# Patient Record
Sex: Male | Born: 1970
Health system: Southern US, Community
[De-identification: ages and names within clinical notes are randomized; demographics above are authoritative.]

## PROBLEM LIST (undated history)

## (undated) DIAGNOSIS — F32A Depression, unspecified: Secondary | ICD-10-CM

## (undated) DIAGNOSIS — E1110 Type 2 diabetes mellitus with ketoacidosis without coma: Secondary | ICD-10-CM

## (undated) DIAGNOSIS — G709 Myoneural disorder, unspecified: Secondary | ICD-10-CM

## (undated) DIAGNOSIS — E781 Pure hyperglyceridemia: Secondary | ICD-10-CM

## (undated) DIAGNOSIS — E1169 Type 2 diabetes mellitus with other specified complication: Secondary | ICD-10-CM

## (undated) DIAGNOSIS — F419 Anxiety disorder, unspecified: Secondary | ICD-10-CM

## (undated) DIAGNOSIS — E669 Obesity, unspecified: Secondary | ICD-10-CM

## (undated) HISTORY — DX: Obesity, unspecified: E66.9

## (undated) HISTORY — PX: REPLACEMENT TOTAL KNEE: SUR1224

## (undated) HISTORY — DX: Type 2 diabetes mellitus with ketoacidosis without coma: E11.10

## (undated) HISTORY — DX: Myoneural disorder, unspecified: G70.9

## (undated) HISTORY — DX: Pure hyperglyceridemia: E78.1

## (undated) HISTORY — DX: Type 2 diabetes mellitus with other specified complication: E11.69

## (undated) HISTORY — PX: JOINT REPLACEMENT: SHX530

## (undated) HISTORY — PX: OTHER SURGICAL HISTORY: SHX169

---

## 1998-08-01 ENCOUNTER — Emergency Department (HOSPITAL_COMMUNITY): Admission: EM | Admit: 1998-08-01 | Discharge: 1998-08-01 | Payer: Self-pay | Admitting: Emergency Medicine

## 1998-08-02 ENCOUNTER — Emergency Department (HOSPITAL_COMMUNITY): Admission: EM | Admit: 1998-08-02 | Discharge: 1998-08-02 | Payer: Self-pay | Admitting: Emergency Medicine

## 1999-06-05 ENCOUNTER — Emergency Department (HOSPITAL_COMMUNITY): Admission: EM | Admit: 1999-06-05 | Discharge: 1999-06-05 | Payer: Self-pay | Admitting: Emergency Medicine

## 2004-09-21 ENCOUNTER — Emergency Department: Payer: Self-pay | Admitting: Unknown Physician Specialty

## 2006-03-28 ENCOUNTER — Ambulatory Visit: Payer: Self-pay | Admitting: Internal Medicine

## 2006-04-26 ENCOUNTER — Ambulatory Visit: Payer: Self-pay | Admitting: Internal Medicine

## 2006-06-15 ENCOUNTER — Ambulatory Visit: Payer: Self-pay | Admitting: Internal Medicine

## 2006-06-16 ENCOUNTER — Ambulatory Visit: Payer: Self-pay | Admitting: Internal Medicine

## 2006-09-14 ENCOUNTER — Ambulatory Visit: Payer: Self-pay | Admitting: Internal Medicine

## 2007-01-11 ENCOUNTER — Ambulatory Visit: Payer: Self-pay | Admitting: Internal Medicine

## 2010-06-08 ENCOUNTER — Inpatient Hospital Stay (HOSPITAL_COMMUNITY): Admission: RE | Admit: 2010-06-08 | Discharge: 2010-06-10 | Payer: Self-pay | Admitting: Orthopedic Surgery

## 2010-09-21 LAB — BASIC METABOLIC PANEL
BUN: 9 mg/dL (ref 6–23)
BUN: 11 mg/dL (ref 6–23)
BUN: 11 mg/dL (ref 6–23)
CO2: 31 mEq/L (ref 19–32)
CO2: 25 mEq/L (ref 19–32)
CO2: 28 mEq/L (ref 19–32)
Calcium: 8.3 mg/dL — ABNORMAL LOW (ref 8.4–10.5)
Calcium: 8.5 mg/dL (ref 8.4–10.5)
Calcium: 9.3 mg/dL (ref 8.4–10.5)
Chloride: 104 mEq/L (ref 96–112)
Chloride: 103 mEq/L (ref 96–112)
Chloride: 105 mEq/L (ref 96–112)
Creatinine, Ser: 0.98 mg/dL (ref 0.4–1.5)
Creatinine, Ser: 0.95 mg/dL (ref 0.4–1.5)
Creatinine, Ser: 0.95 mg/dL (ref 0.4–1.5)
GFR calc Af Amer: >60 mL/min (ref >60)
GFR calc Af Amer: >60 mL/min (ref >60)
GFR calc Af Amer: >60 mL/min (ref >60)
GFR calc non Af Amer: >60 mL/min (ref >60)
GFR calc non Af Amer: >60 mL/min (ref >60)
GFR calc non Af Amer: >60 mL/min (ref >60)
Glucose, Bld: 119 mg/dL — ABNORMAL HIGH (ref 70–99)
Glucose, Bld: 126 mg/dL — ABNORMAL HIGH (ref 70–99)
Glucose, Bld: 83 mg/dL (ref 70–99)
Potassium: 4.4 mEq/L (ref 3.5–5.1)
Potassium: 4 mEq/L (ref 3.5–5.1)
Potassium: 4.8 mEq/L (ref 3.5–5.1)
Sodium: 140 mEq/L (ref 135–145)
Sodium: 138 mEq/L (ref 135–145)
Sodium: 140 mEq/L (ref 135–145)

## 2010-09-21 LAB — RAPID URINE DRUG SCREEN, HOSP PERFORMED
Amphetamines: NOT DETECTED
Barbiturates: NOT DETECTED
Benzodiazepines: NOT DETECTED
Cocaine: NOT DETECTED
Opiates: NOT DETECTED
Tetrahydrocannabinol: POSITIVE — AB

## 2010-09-21 LAB — CBC
HCT: 33.7 % — ABNORMAL LOW (ref 39.0–52.0)
HCT: 38.7 % — ABNORMAL LOW (ref 39.0–52.0)
HCT: 48.4 % (ref 39.0–52.0)
Hemoglobin: 11.6 g/dL — ABNORMAL LOW (ref 13.0–17.0)
Hemoglobin: 13.4 g/dL (ref 13.0–17.0)
Hemoglobin: 17 g/dL (ref 13.0–17.0)
MCH: 31.2 pg (ref 26.0–34.0)
MCH: 30.9 pg (ref 26.0–34.0)
MCH: 32.5 pg (ref 26.0–34.0)
MCHC: 34.4 g/dL (ref 30.0–36.0)
MCHC: 34.6 g/dL (ref 30.0–36.0)
MCHC: 35.1 g/dL (ref 30.0–36.0)
MCV: 90.6 fL (ref 78.0–100.0)
MCV: 89.2 fL (ref 78.0–100.0)
MCV: 92.6 fL (ref 78.0–100.0)
Platelets: 129 10*3/uL — ABNORMAL LOW (ref 150–400)
Platelets: 143 10*3/uL — ABNORMAL LOW (ref 150–400)
Platelets: 175 10*3/uL (ref 150–400)
RBC: 3.72 MIL/uL — ABNORMAL LOW (ref 4.22–5.81)
RBC: 4.34 MIL/uL (ref 4.22–5.81)
RBC: 5.23 MIL/uL (ref 4.22–5.81)
RDW: 12 % (ref 11.5–15.5)
RDW: 12 % (ref 11.5–15.5)
RDW: 12.3 % (ref 11.5–15.5)
WBC: 13.6 10*3/uL — ABNORMAL HIGH (ref 4.0–10.5)
WBC: 10.9 10*3/uL — ABNORMAL HIGH (ref 4.0–10.5)
WBC: 24 10*3/uL — ABNORMAL HIGH (ref 4.0–10.5)

## 2010-09-21 LAB — URINALYSIS, ROUTINE W REFLEX MICROSCOPIC
Bilirubin Urine: NEGATIVE
Glucose, UA: NEGATIVE mg/dL
Hgb urine dipstick: NEGATIVE
Ketones, ur: NEGATIVE mg/dL
Nitrite: NEGATIVE
Protein, ur: NEGATIVE mg/dL
Specific Gravity, Urine: 1.018 (ref 1.005–1.030)
Urobilinogen, UA: 0.2 mg/dL (ref 0.0–1.0)
pH: 6 (ref 5.0–8.0)

## 2010-09-21 LAB — SURGICAL PCR SCREEN
MRSA, PCR: NEGATIVE
Staphylococcus aureus: NEGATIVE

## 2010-09-21 LAB — DIFFERENTIAL
Basophils Absolute: 0.1 10*3/uL (ref 0.0–0.1)
Basophils Relative: 1 % (ref 0–1)
Eosinophils Absolute: 0.4 10*3/uL (ref 0.0–0.7)
Eosinophils Relative: 3 % (ref 0–5)
Lymphocytes Relative: 31 % (ref 12–46)
Lymphs Abs: 3.4 10*3/uL (ref 0.7–4.0)
Monocytes Absolute: 0.9 10*3/uL (ref 0.1–1.0)
Monocytes Relative: 8 % (ref 3–12)
Neutro Abs: 6.2 10*3/uL (ref 1.7–7.7)
Neutrophils Relative %: 57 % (ref 43–77)

## 2010-09-21 LAB — PROTIME-INR
INR: 0.95 (ref 0.00–1.49)
Prothrombin Time: 12.9 seconds (ref 11.6–15.2)

## 2010-09-21 LAB — TYPE AND SCREEN
ABO/RH(D): O POS
Antibody Screen: NEGATIVE

## 2010-09-21 LAB — APTT: aPTT: 33 seconds (ref 24–37)

## 2010-09-21 LAB — ABO/RH: ABO/RH(D): O POS

## 2010-11-23 NOTE — Assessment & Plan Note (Signed)
[facility]                             PULMONARY OFFICE NOTE   YOSEF, KROGH                       MRN:          [id number]  DATE:01/11/2007                            DOB:          05-24-1971    HISTORY OF PRESENT ILLNESS:  This is a 40 year old white male, former  smoker, with evidence of very mild air flow obstruction by previous  PFT's that I thought was probably minimal COPD with a possible asthmatic  component, suggested by exacerbation of symptoms on exposure to smoke  and cologne. I thought he probably had a very mild asthmatic component  and was concerned because he really did not understand the concept of  maintenance versus rescue therapy and continues to fail to understand  this. He has been consistently breaking the rule of 2's for the last  several weeks with Ventolin but did not institute QVAR therapy as I had  recommended and he is short of breath whenever he does anything more  than slow ADL's.   REVIEW OF SYSTEMS:  He denies any exertional chest pain, orthopnea, PND,  leg swelling, purulent sputum, overt sinus reflux.   PHYSICAL EXAMINATION:  GENERAL:  He is a pleasant ambulatory white male  in no acute distress.  VITAL SIGNS:  Stable.  HEENT:  Unremarkable. Pharynx clear.  LUNGS:  Fields reveal trace wheeze bilaterally.  CARDIOVASCULAR:  Regular rhythm without murmur, rub, or gallop.  ABDOMEN:  Soft and benign.  EXTREMITIES:  No calf tenderness, cyanosis, clubbing, or edema.   IMPRESSION:  Mild chronic obstructive pulmonary disease with a minimal  asthmatic component, for which he has been over-using albuterol for  several weeks and did not follow the instructions on instituting  maintenance therapy. Rather than give him too much to think about, I  have recommended that he start Symbicort 1t60/4.5 2 puffs b.i.d.,  acknowledging up front that he is not likely to comply with maintenance  therapy but at least this way,  when he does use his maintenance therapy,  he will get immediate feedback that the medications are working and  minimize the use of albuterol, which was my stated goal on his previous  visit. With Symbicort, even if he fails to remember the rule of 2's,  he will use the drug up to 2 puffs twice a day (a maximum) and if he  still needs Ventolin and continues to break the rule of 2's, then I  definitely would like to see him back. Otherwise, we will see him back  here on a p.r.n. basis.     Charlaine Dalton. Sherene Sires, MD, [facility]  Electronically Signed    MBW/MedQ  DD: 01/11/2007  DT: 01/12/2007  Job #: [id number]   cc:   Louanna Raw

## 2010-11-26 NOTE — Assessment & Plan Note (Signed)
[facility]                               PULMONARY OFFICE NOTE   Cody Curry, Cody Curry                       MRN:          [id number]  DATE:04/26/2006                            DOB:          [birth year]    REFERRING PHYSICIAN:  Louanna Raw   HISTORY:  This is a 40 year old white male with previous history of toxic  shock syndrome with respiratory failure (presumably ARDS), seen at the  request of Dr. Earlene Plater, for evaluation of dyspnea and abnormal chest x-ray.  He reports occasional symptoms of dyspnea with chest tightness but not  reproducible with exertion or nocturnally.  He denies any pleuritic pain,  fevers, chills, sweats, orthopnea, PND, or leg swelling.   PHYSICAL EXAMINATION:  GENERAL:  He is an anxious white man in no acute  distress.  VITAL SIGNS:  Afebrile, normal vital signs.  HEENT:  Unremarkable.  Oropharynx is clear.  LUNGS:  Fields are clear bilaterally on auscultation percussion, although he  did feel like he was hyperventilating during the exam.  HEART:  There is a regular rhythm without murmur, gallop, or rub.  ABDOMEN:  Soft, benign.  EXTREMITIES:  Warm without calf tenderness, cyanosis, clubbing.   Chest x-ray revealed minimal increase in markings and normal to slightly  reduced lung volumes.  Hemoglobin saturation 98% on room air.   IMPRESSION:  Minimal residual pulmonary fibrosis, status post apparent acute  respiratory distress syndrome from toxic shock syndrome related to a wound  infection.   RECOMMENDATIONS:  1. Return for PFTs in 6 weeks.  2. He does have albuterol to use p.r.n. tightness, but I do not believe      that asthma is likely nor do I believe he truly has beta-2 responsive      lung disease.  I wonder about a component of anxiety that will need to      be addressed, however, if this pattern continues.            ______________________________  Cody Dalton Sherene Sires, MD, [facility]    MBW/MedQ  DD:   04/26/2006  DT:  04/28/2006  Job #:  [id number]   cc:   Louanna Raw

## 2010-11-26 NOTE — Assessment & Plan Note (Signed)
[facility]                             PULMONARY OFFICE NOTE   TIRON, SUSKI                       MRN:          [id number]  DATE:06/16/2006                            DOB:          [birth year]    PRIMARY SERVICE EXTENDED PULMONARY/FOLLOWUP OFFICE VISIT:   HISTORY:  This 40 year old white male former smoker who quit in  September 2007 and has a history of ARDS with abnormal chest x-ray that  I thought just was classic for minimal residual fibrotic change 10 years  after ARDS.  However, he returns for PFTs as requested having used  albuterol p.r.n. chest tightness up to once daily with complete relief  and no nocturnal symptoms.  He is here today with requesting a new  albuterol inhaler.   He denies any significant exertional chest pain, orthopnea, PND, leg  swelling, nocturnal wheeze, fevers, chills, sweats,  excess sputum  product, or chest pain of any kind, sinus symptoms or reflux symptoms.   PHYSICAL EXAMINATION:  GENERAL:  He is a robust ambulatory white male in  acute distress.  VITAL SIGNS:  Stable.  HEENT:  Unremarkable.  Oropharynx clear.  LUNGS:  Lung fields are completely clear bilaterally to auscultation and  percussion.  HEART:  Regular rhythm without murmur, gallop or rub.  ABDOMEN:  Soft, benign.  EXTREMITIES:  Warm without calf tenderness, cyanosis, clubbing or edema.   02 saturation 96% room air.   MDI technique was reviewed with the patient and improved from 25%-50%  with coaching.   PFTs reviewed with patient indicate an FEV-1 of 104% albeit with a ratio  of 66% and a 10% improvement after bronchodilators.  His diffusion  capacity was 83%.   IMPRESSION:  1. Chest x-ray changes are consistent with very mild fibrotic changes      from a previous acute lung injury and do not represent progressive      pulmonary fibrosis.  2. His main symptoms appear to be related to airways instability.      Although we were not  able to document definite asthma today, note      that with such a high baseline FEV-1 we have not excluded it either      and based on his history I would suspect that he does have an      asthmatic component, especially in the setting of having previously      smoked.   I therefore introduced him to the use of Symbicort 80/4.5 two puffs  b.i.d. and I have asked him to start two puffs b.i.d. to see if it  completely eliminates his need for albuterol and all of his  symptoms and if so, he could taper this to one puff b.i.d. but return in  3 months for followup either way.     Charlaine Dalton. Sherene Sires, MD, [facility]  Electronically Signed    MBW/MedQ  DD: 06/17/2006  DT: 06/17/2006  Job #: [id number]   cc:   Louanna Raw

## 2010-11-26 NOTE — Assessment & Plan Note (Signed)
[facility]                             PULMONARY OFFICE NOTE   Cody, Curry                       MRN:          [id number]  DATE:09/14/2006                            DOB:          02-27-1971    HISTORY:  This is a pulmonary/followup office visit. This is a 40-year-  old white male with a history of ARDS with frequent albuterol use. On  his previous visit, he was totally on Symbicort, but then ran out of  Symbicort 2 weeks ago with no increase in albuterol use nor any  symptoms, either daytime or nighttime of either cough or dyspnea.   PHYSICAL EXAMINATION:  GENERAL: He is a pleasant and ambulatory white  man in no acute distress.  VITAL SIGNS: Stable.  HEENT: Unremarkable, oral pharynx is clear.  LUNGS: Fields are clear to auscultation and bilaterally.  CARDIAC: Regular rate and rhythm without murmur, gallop, or rub.  ABDOMEN: Soft and benign.  EXTREMITIES: Warm without calf tenderness, cyanosis, clubbing, or edema.   MDI technique was reviewed and approached with 50% baseline, 90% after  teaching.   IMPRESSION:  No evidence of active chronic asthma. He is at risk based  on his previous history and therefore I reviewed with him the rule of  twos to emphasize to him that he should be to get by with one inhaler  (for which I gave him Ventolin HFA with a counter) to use up to twice  weekly for symptoms, but if he is needing it more than twice weekly, he  needs to restart a controlling medication. His symptoms should be  controlled with Qvar 40 mg 2 puffs b.i.d. tapered to 1 puff b.i.d. if  doing well. If Qvar does not satisfy the rule of twos, then he needs  to return for Symbicort. Follow up will be in 6 months, earlier if  needed.     Charlaine Dalton. Sherene Sires, MD, [facility]  Electronically Signed    MBW/MedQ  DD: 09/14/2006  DT: 09/15/2006  Job #: [id number]

## 2010-11-26 NOTE — Assessment & Plan Note (Signed)
[facility]                               PULMONARY OFFICE NOTE   PACEN, WATFORD                       MRN:          [id number]  DATE:03/28/2006                            DOB:          1970-10-20    REASON FOR CONSULTATION:  Pulmonary infiltrates.   HISTORY:  This is a 40 year old white male, who was actively smoking until  the abrupt onset of dyspnea two weeks ago associated with fever to 103, a  hacking cough productive of  slightly yellowish sputum and also vomiting  with coughing paroxysms.  In addition he noticed a sore throat with poor  appetite, but states he feels almost completely back to normal now, in  fact about 90% better after a course of Biaxin.  Specifically, he denies  any ongoing fever or significant cough, and has minimal dry cough now.  He  denies any pleuritic pain, myalgias, arthralgias, active sinus or reflux  symptoms, or significant dyspnea except with heavy activity.   PAST MEDICAL HISTORY:  Significant for the fact this patient had previous  toxic shock syndrome after a left knee surgery, was in the ICU in [facility] in the mid 90s, apparently on a ventilator.  He does not remember  having chest x-rays done after this event.  He also has a history of mild  sinus and allergy problems, but they are not presently active.   ALLERGIES:  None known.   MEDICATIONS:  None regularly.  He has used p.r.n. albuterol from his wife in  the past; however, does not use it presently.   SOCIAL HISTORY:  He quit smoking March 15, 2006.  He has no unusual  travel, pet or hobby exposure history.   FAMILY HISTORY:  Positive for asthma in a brother.  Negative for respiratory  diseases otherwise.   REVIEW OF SYSTEMS:  Taken in detail on the work sheet, and significant for  the problems as outlined above.   PHYSICAL EXAMINATION:  This is a pleasant, ambulatory white male in no acute  distress.  Afebrile, stable vital  signs.  HEENT:  Unremarkable.  Oropharynx is clear, dentition is intact.  NECK:  Supple without cervical adenopathy or tenderness.  The trachea was  midline, no thyromegaly.  LUNGS:  Lung fields are perfectly clear bilaterally to auscultation and  percussion.  There is a regular rhythm without murmur, gallop or rub.  ABDOMEN:  Soft, benign.  EXTREMITIES:  Warm without calf tenderness, cyanosis, clubbing or edema.   Hemoglobin saturation 97% on room air.   Chest x-rays were reviewed, but I could not read the dates on the disk.  These were apparently done 2 weeks ago, and show minimal increase in  markings.   IMPRESSION:  Abnormal chest x-ray with increased markings, may in fact be a  residual from previous toxic shock syndrome/acute lung injury that occurred  in the mid 90s.  Symptomatically this patient appears to be bothered by  minimal rhinitis, tracheobronchitis that is probably triggered by smoking,  and now that he has quit smoking we should see not only an  elimination of  all of his symptoms, but less likelihood of recurrent infection.   I did recommend a followup at 4 weeks, just to make sure there was no  residual ongoing inflammatory concern (such as one sees sometimes with  atypical pneumonia in the form of inflammatory conditions like bronchiolitis  obliterans with organizing pneumonia.  However, I believe this patient's  prognosis is excellent, especially if he stopped smoking.   I did recommend that he avoid using his wife's inhaler, and will consider  pulmonary function tests on his next visit if he continues to be at all  symptomatic, as well as consideration for followup CT scan, which at this  point is indicated.                                   Charlaine Dalton. Sherene Sires, MD, FCCP   MBW/MedQ  DD:  03/28/2006  DT:  03/29/2006  Job #:  [id number]   cc:   Louanna Raw

## 2015-12-22 ENCOUNTER — Ambulatory Visit (INDEPENDENT_AMBULATORY_CARE_PROVIDER_SITE_OTHER): Payer: BLUE CROSS/BLUE SHIELD | Admitting: Family Medicine

## 2015-12-22 ENCOUNTER — Encounter: Payer: Self-pay | Admitting: Family Medicine

## 2015-12-22 VITALS — BP 126/90 | HR 80 | Temp 98.5°F | Resp 18 | Wt 270.0 lb

## 2015-12-22 DIAGNOSIS — M542 Cervicalgia: Secondary | ICD-10-CM

## 2015-12-22 DIAGNOSIS — Z7189 Other specified counseling: Secondary | ICD-10-CM | POA: Diagnosis not present

## 2015-12-22 DIAGNOSIS — Z23 Encounter for immunization: Secondary | ICD-10-CM | POA: Diagnosis not present

## 2015-12-22 DIAGNOSIS — R208 Other disturbances of skin sensation: Secondary | ICD-10-CM

## 2015-12-22 DIAGNOSIS — R202 Paresthesia of skin: Secondary | ICD-10-CM | POA: Diagnosis not present

## 2015-12-22 DIAGNOSIS — Z7689 Persons encountering health services in other specified circumstances: Secondary | ICD-10-CM

## 2015-12-22 DIAGNOSIS — E669 Obesity, unspecified: Secondary | ICD-10-CM

## 2015-12-22 DIAGNOSIS — R200 Anesthesia of skin: Secondary | ICD-10-CM

## 2015-12-22 NOTE — Progress Notes (Signed)
Subjective:    Patient ID: Cody Curry, male    DOB: 1971/03/20, 45 y.o.   MRN: [id number]  HPI  Here today to establish care. He reports 2 years of pain in his neck radiating into both arms. He states that his right and left arm will go numb almost on a daily basis. In fact his hands are numb right now. All 5 digits on his hands are numb including the fifth digit. He has a negative Tinel sign. He has a negative Phalen sign. He has a negative Spurling sign. Muscle strength in his arms is 5 over 5 equal and symmetric bilaterally. Symptoms sound neuropathic in nature however given the fact the fifth digit is involved, both arms are involved, and they radiate from the neck to the hand, I doubt carpal tunnel but I am suspicious about cervical radiculopathy. He also smokes 1-1/2 packs of cigarettes a day. His blood pressure today is borderline. He has a significant family history of COPD, and hyperlipidemia and hypertriglyceridemia. No past medical history on file. No current outpatient prescriptions on file prior to visit.   No current facility-administered medications on file prior to visit.    No Known Allergies Social History   Social History  . Marital Status: Married    Spouse Name: N/A  . Number of Children: N/A  . Years of Education: N/A   Occupational History  . Not on file.   Social History Main Topics  . Smoking status: Current Every Day Smoker -- 1.50 packs/day    Types: Cigarettes  . Smokeless tobacco: Not on file  . Alcohol Use: 0.0 oz/week    0 Standard drinks or equivalent per week     Comment: Very occasional  . Drug Use: Yes     Comment: Marijuana use  . Sexual Activity: Not on file   Other Topics Concern  . Not on file   Social History Narrative  . No narrative on file   Family History  Problem Relation Age of Onset  . COPD Mother   . Stroke Mother   . Hyperlipidemia Father   . Hypertension Father   . Cancer Father     bladder  . Heart disease  Father   . COPD Father      Review of Systems  All other systems reviewed and are negative.      Objective:   Physical Exam  Constitutional: He is oriented to person, place, and time. He appears well-developed and well-nourished. No distress.  HENT:  Head: Normocephalic and atraumatic.  Right Ear: External ear normal.  Left Ear: External ear normal.  Nose: Nose normal.  Mouth/Throat: Oropharynx is clear and moist. No oropharyngeal exudate.  Eyes: Conjunctivae and EOM are normal. Pupils are equal, round, and reactive to light. Right eye exhibits no discharge. Left eye exhibits no discharge. No scleral icterus.  Neck: Normal range of motion. Neck supple. No JVD present. No tracheal deviation present. No thyromegaly present.  Cardiovascular: Normal rate, regular rhythm, normal heart sounds and intact distal pulses.  Exam reveals no gallop and no friction rub.   No murmur heard. Pulmonary/Chest: Effort normal and breath sounds normal. No stridor. No respiratory distress. He has no wheezes. He has no rales. He exhibits no tenderness.  Abdominal: Soft. Bowel sounds are normal. He exhibits no distension and no mass. There is no tenderness. There is no rebound and no guarding.  Musculoskeletal: He exhibits no edema.       Cervical back: He  exhibits decreased range of motion and pain.       Right hand: He exhibits normal range of motion, no tenderness and no bony tenderness. Decreased sensation noted. Decreased sensation is present in the ulnar distribution and is present in the medial distribution.       Left hand: He exhibits normal range of motion, no tenderness and no bony tenderness. Decreased sensation noted. Decreased sensation is present in the ulnar distribution and is present in the medial distribution.  Lymphadenopathy:    He has no cervical adenopathy.  Neurological: He is alert and oriented to person, place, and time. He has normal reflexes. He displays normal reflexes. No cranial  nerve deficit. He exhibits normal muscle tone. Coordination normal.  Skin: Skin is warm. No rash noted. He is not diaphoretic. No erythema. No pallor.  Psychiatric: He has a normal mood and affect. His behavior is normal. Judgment and thought content normal.  Vitals reviewed.         Assessment & Plan:  Need for Tdap vaccination - Plan: Tdap vaccine greater than or equal to 7yo IM  Establishing care with new doctor, encounter for - Plan: CBC with Differential/Platelet, COMPLETE METABOLIC PANEL WITH GFR, Lipid panel  Neck pain - Plan: MR Cervical Spine Wo Contrast  Right arm numbness - Plan: MR Cervical Spine Wo Contrast  Left arm numbness - Plan: MR Cervical Spine Wo Contrast  Obesity - Plan: CBC with Differential/Platelet, COMPLETE METABOLIC PANEL WITH GFR, Lipid panel  I suspect the patient has cervical radiculopathy causing the numbness in both hands as I discussed in the history of present illness. Carpal tunnel syndrome would be less likely given the fact the fifth digit is involved. Therefore I will schedule the patient for an MRI of the cervical spine as the symptoms are worsening. They've been steadily progressing for last 2 years. If the MRI is normal, I would recommend nerve conduction test. I strongly recommended smoking cessation. His blood pressure today is borderline. I recommended diet exercise and weight loss to help address his blood pressure. I recommended 1500 cal a day and 30 minutes aerobic exercise a day. I would like the patient to return fasting for CBC, CMP, and a fasting lipid panel.

## 2015-12-24 ENCOUNTER — Other Ambulatory Visit: Payer: BLUE CROSS/BLUE SHIELD

## 2015-12-24 ENCOUNTER — Ambulatory Visit: Payer: BLUE CROSS/BLUE SHIELD | Admitting: Family Medicine

## 2015-12-24 VITALS — BP 122/88

## 2015-12-24 DIAGNOSIS — R030 Elevated blood-pressure reading, without diagnosis of hypertension: Principal | ICD-10-CM

## 2015-12-24 DIAGNOSIS — IMO0001 Reserved for inherently not codable concepts without codable children: Secondary | ICD-10-CM

## 2015-12-24 DIAGNOSIS — E669 Obesity, unspecified: Secondary | ICD-10-CM | POA: Diagnosis not present

## 2015-12-24 DIAGNOSIS — Z7189 Other specified counseling: Secondary | ICD-10-CM | POA: Diagnosis not present

## 2015-12-24 LAB — CBC WITH DIFFERENTIAL/PLATELET
Basophils Absolute: 0 cells/uL (ref 0–200)
Basophils Relative: 0 %
Eosinophils Absolute: 236 cells/uL (ref 15–500)
Eosinophils Relative: 2 %
HCT: 46.8 % (ref 38.5–50.0)
Hemoglobin: 15.8 g/dL (ref 13.0–17.0)
Lymphocytes Relative: 27 %
Lymphs Abs: 3186 cells/uL (ref 850–3900)
MCH: 31 pg (ref 27.0–33.0)
MCHC: 33.8 g/dL (ref 32.0–36.0)
MCV: 91.9 fL (ref 80.0–100.0)
MPV: 9.8 fL (ref 7.5–12.5)
Monocytes Absolute: 826 cells/uL (ref 200–950)
Monocytes Relative: 7 %
Neutro Abs: 7552 cells/uL (ref 1500–7800)
Neutrophils Relative %: 64 %
Platelets: 180 10*3/uL (ref 140–400)
RBC: 5.09 MIL/uL (ref 4.20–5.80)
RDW: 13.6 % (ref 11.0–15.0)
WBC: 11.8 10*3/uL — ABNORMAL HIGH (ref 3.8–10.8)

## 2015-12-24 NOTE — Patient Instructions (Signed)
Dr. Dennard Schaumann aware and informed pt that it is still borderline and to keep a check on it and let us know in a few weeks how it is doing. Pt verbalized understanding.

## 2015-12-25 ENCOUNTER — Encounter: Payer: Self-pay | Admitting: Family Medicine

## 2015-12-25 LAB — COMPLETE METABOLIC PANEL WITH GFR
ALT: 20 U/L (ref 9–46)
AST: 20 U/L (ref 10–40)
Albumin: 4.2 g/dL (ref 3.6–5.1)
Alkaline Phosphatase: 83 U/L (ref 40–115)
BUN: 12 mg/dL (ref 7–25)
CO2: 26 mmol/L (ref 20–31)
Calcium: 9.1 mg/dL (ref 8.6–10.3)
Chloride: 104 mmol/L (ref 98–110)
Creat: 0.95 mg/dL (ref 0.60–1.35)
GFR, Est African American: >89 mL/min (ref >=60)
GFR, Est Non African American: >89 mL/min (ref >=60)
Glucose, Bld: 106 mg/dL — ABNORMAL HIGH (ref 70–99)
Potassium: 4.4 mmol/L (ref 3.5–5.3)
Sodium: 138 mmol/L (ref 135–146)
Total Bilirubin: 0.4 mg/dL (ref 0.2–1.2)
Total Protein: 6.9 g/dL (ref 6.1–8.1)

## 2015-12-25 LAB — LIPID PANEL
Cholesterol: 219 mg/dL — ABNORMAL HIGH (ref 125–200)
HDL: 27 mg/dL — ABNORMAL LOW (ref >=40)
Total CHOL/HDL Ratio: 8.1 Ratio — ABNORMAL HIGH (ref <=5.0)
Triglycerides: 639 mg/dL — ABNORMAL HIGH (ref <150)

## 2016-01-01 ENCOUNTER — Telehealth: Payer: Self-pay | Admitting: Family Medicine

## 2016-01-01 DIAGNOSIS — E781 Pure hyperglyceridemia: Secondary | ICD-10-CM

## 2016-01-01 MED ORDER — FENOFIBRATE 160 MG PO TABS: 160.0000 mg | ORAL_TABLET | Freq: Every day | ORAL | Status: DC

## 2016-01-01 NOTE — Telephone Encounter (Signed)
-----   Message from Susy Frizzle, MD sent at 12/25/2015  7:06 AM EDT ----- Cody Curry are almost 700!Marland Kitchen  I would start fenofibrate 160 mg poqday and start low carb, low fat diet and begin exercising 30 min a day 5 days a week and recheck in 3 months.

## 2016-01-01 NOTE — Telephone Encounter (Signed)
Pt aware.  Given dietary and exercise recommendations.  Rx to pharmacy.  Need to see again in 3 months.

## 2016-01-11 ENCOUNTER — Ambulatory Visit
Admission: RE | Admit: 2016-01-11 | Discharge: 2016-01-11 | Disposition: A | Discharge status: Home / Self Care | Payer: BLUE CROSS/BLUE SHIELD | Source: Ambulatory Visit | Attending: Family Medicine | Admitting: Family Medicine

## 2016-01-11 DIAGNOSIS — M542 Cervicalgia: Secondary | ICD-10-CM

## 2016-01-11 DIAGNOSIS — M4802 Spinal stenosis, cervical region: Secondary | ICD-10-CM | POA: Diagnosis not present

## 2016-01-11 DIAGNOSIS — R200 Anesthesia of skin: Secondary | ICD-10-CM

## 2016-01-15 ENCOUNTER — Other Ambulatory Visit: Payer: Self-pay | Admitting: *Deleted

## 2016-01-15 DIAGNOSIS — R200 Anesthesia of skin: Secondary | ICD-10-CM

## 2016-02-18 ENCOUNTER — Encounter: Payer: Self-pay | Admitting: Family Medicine

## 2016-02-18 ENCOUNTER — Ambulatory Visit (INDEPENDENT_AMBULATORY_CARE_PROVIDER_SITE_OTHER): Payer: BLUE CROSS/BLUE SHIELD | Admitting: Family Medicine

## 2016-02-18 VITALS — BP 112/88 | HR 88 | Temp 98.4°F | Resp 18 | Wt 264.0 lb

## 2016-02-18 DIAGNOSIS — M5431 Sciatica, right side: Secondary | ICD-10-CM

## 2016-02-18 MED ORDER — CYCLOBENZAPRINE HCL 10 MG PO TABS: 10.0000 mg | ORAL_TABLET | Freq: Three times a day (TID) | ORAL | 0 refills | Status: DC | PRN

## 2016-02-18 MED ORDER — PREDNISONE 20 MG PO TABS: ORAL_TABLET | ORAL | 0 refills | Status: DC

## 2016-02-18 NOTE — Progress Notes (Signed)
   Subjective:    Patient ID: Cody Curry, male    DOB: 04-04-1971, 45 y.o.   MRN: [id number]  HPI Presents with one-week of severe lower back pain which radiates into his right gluteus and down his posterior right thigh. He also states that he has felt burning pain in the back of his right leg on occasion. He also has muscle spasms in his right lower back. He has severe pain with for flexion. It hurts to sit. It hurts to stand. It hurts to drive. It hurts to bend over to put on his shoes. He cannot find any position that provides relief. He denies any saddle anesthesia. He denies any bowel or bladder incontinence. He does not remember any specific injury although it did occur after he was lifting heavy objects building a slide for his grandchild. Past Medical History:  Diagnosis Date  . Hypertriglyceridemia    No past surgical history on file. Current Outpatient Prescriptions on File Prior to Visit  Medication Sig Dispense Refill  . fenofibrate 160 MG tablet Take 1 tablet (160 mg total) by mouth daily. 90 tablet 0   No current facility-administered medications on file prior to visit.    No Known Allergies Social History   Social History  . Marital status: Married    Spouse name: N/A  . Number of children: N/A  . Years of education: N/A   Occupational History  . Not on file.   Social History Main Topics  . Smoking status: Current Every Day Smoker    Packs/day: 1.50    Types: Cigarettes  . Smokeless tobacco: Not on file  . Alcohol use 0.0 oz/week     Comment: Very occasional  . Drug use:      Comment: Marijuana use  . Sexual activity: Not on file   Other Topics Concern  . Not on file   Social History Narrative  . No narrative on file       Review of Systems  All other systems reviewed and are negative.      Objective:   Physical Exam  Cardiovascular: Normal rate, regular rhythm and normal heart sounds.   Pulmonary/Chest: Effort normal and breath sounds  normal. No respiratory distress. He has no wheezes. He has no rales.  Musculoskeletal:       Lumbar back: He exhibits decreased range of motion, tenderness, pain and spasm. He exhibits no bony tenderness.  Neurological: He displays normal reflexes. He exhibits normal muscle tone. Coordination normal.  Vitals reviewed.         Assessment & Plan:  Sciatica of right side - Plan: predniSONE (DELTASONE) 20 MG tablet, cyclobenzaprine (FLEXERIL) 10 MG tablet I suspect the patient has herniated a disc in his lower back. Begin prednisone taper pack. Use Flexeril 10 mg every 8 hours as needed for muscle spasms. Recheck in one week or sooner if worse

## 2016-12-09 ENCOUNTER — Ambulatory Visit (INDEPENDENT_AMBULATORY_CARE_PROVIDER_SITE_OTHER): Payer: 59 | Admitting: Family Medicine

## 2016-12-09 ENCOUNTER — Encounter: Payer: Self-pay | Admitting: Family Medicine

## 2016-12-09 VITALS — BP 136/92 | HR 76 | Temp 98.3°F | Resp 18 | Ht 70.0 in | Wt 281.0 lb

## 2016-12-09 DIAGNOSIS — R202 Paresthesia of skin: Secondary | ICD-10-CM

## 2016-12-09 DIAGNOSIS — R200 Anesthesia of skin: Secondary | ICD-10-CM | POA: Diagnosis not present

## 2016-12-09 DIAGNOSIS — E781 Pure hyperglyceridemia: Secondary | ICD-10-CM | POA: Diagnosis not present

## 2016-12-09 LAB — COMPLETE METABOLIC PANEL WITH GFR
ALT: 31 U/L (ref 9–46)
AST: 24 U/L (ref 10–40)
Albumin: 4.2 g/dL (ref 3.6–5.1)
Alkaline Phosphatase: 95 U/L (ref 40–115)
BUN: 15 mg/dL (ref 7–25)
CO2: 25 mmol/L (ref 20–31)
Calcium: 9.1 mg/dL (ref 8.6–10.3)
Chloride: 102 mmol/L (ref 98–110)
Creat: 1 mg/dL (ref 0.60–1.35)
GFR, Est African American: >89 mL/min (ref >=60)
GFR, Est Non African American: 89 mL/min (ref >=60)
Glucose, Bld: 118 mg/dL — ABNORMAL HIGH (ref 70–99)
Potassium: 4.2 mmol/L (ref 3.5–5.3)
Sodium: 138 mmol/L (ref 135–146)
Total Bilirubin: 0.5 mg/dL (ref 0.2–1.2)
Total Protein: 6.9 g/dL (ref 6.1–8.1)

## 2016-12-09 LAB — LIPID PANEL
Cholesterol: 269 mg/dL — ABNORMAL HIGH (ref <200)
HDL: 25 mg/dL — ABNORMAL LOW (ref >40)
Total CHOL/HDL Ratio: 10.8 Ratio — ABNORMAL HIGH (ref <5.0)
Triglycerides: 948 mg/dL — ABNORMAL HIGH (ref <150)

## 2016-12-09 NOTE — Progress Notes (Signed)
Subjective:    Patient ID: Cody Curry, male    DOB: [birth year], 46 y.o.   MRN: [id number]  HPI Patient has a history of hypertriglyceridemia with triglyceride levels greater than 600 which he takes fenofibrate. He is long overdue to recheck fasting lab work. He is here today however primarily due to numbness in his right and left arm. An MRI was performed of the cervical spine last year. The results are dictated below: Mild acute exacerbation of chronic C4-C5 facet arthropathy on the left. Mild disc and endplate degeneration also at that level, subsequently with up to mild spinal and biforaminal stenosis. 2. Similar multifactorial mild spinal and biforaminal stenosis at C3-C4.  At that time, we recommend nerve conduction studies to evaluate for possible carpal tunnel syndrome. The patient would like to proceed with that now. Particularly, he recently had an incident where after mowing his yard using a push mower, he felt sudden sharp electrical pain radiating from his wrist down the fourth digit. The pain was intense and he was also experiencing numbness. Pain also seems to happen whenever he is driving. The numbness and pain primarily involves his forearms and hands bilaterally right greater than left. Past Medical History:  Diagnosis Date  . Hypertriglyceridemia    No past surgical history on file. Current Outpatient Prescriptions on File Prior to Visit  Medication Sig Dispense Refill  . cyclobenzaprine (FLEXERIL) 10 MG tablet Take 1 tablet (10 mg total) by mouth 3 (three) times daily as needed for muscle spasms. 30 tablet 0  . fenofibrate 160 MG tablet Take 1 tablet (160 mg total) by mouth daily. 90 tablet 0  . predniSONE (DELTASONE) 20 MG tablet 3 tabs poqday 1-2, 2 tabs poqday 3-4, 1 tab poqday 5-6 12 tablet 0   No current facility-administered medications on file prior to visit.    No Known Allergies Social History   Social History  . Marital status: Married    Spouse name:  N/A  . Number of children: N/A  . Years of education: N/A   Occupational History  . Not on file.   Social History Main Topics  . Smoking status: Current Every Day Smoker    Packs/day: 1.50    Types: Cigarettes  . Smokeless tobacco: Never Used  . Alcohol use 0.0 oz/week     Comment: Very occasional  . Drug use: Yes     Comment: Marijuana use  . Sexual activity: Not on file   Other Topics Concern  . Not on file   Social History Narrative  . No narrative on file      Review of Systems  All other systems reviewed and are negative.      Objective:   Physical Exam  Constitutional: He is oriented to person, place, and time.  Cardiovascular: Normal rate, regular rhythm and normal heart sounds.   Pulmonary/Chest: Effort normal and breath sounds normal. No respiratory distress. He has no wheezes.  Neurological: He is oriented to person, place, and time. He has normal reflexes. He displays normal reflexes. No cranial nerve deficit. He exhibits normal muscle tone. Coordination normal.  Vitals reviewed.         Assessment & Plan:  Hypertriglyceridemia - Plan: COMPLETE METABOLIC PANEL WITH GFR, Lipid panel  Right arm numbness  Left arm numbness  I'll schedule patient for EMG/nerve conduction studies with neurology. If the patient doesn't fact have moderate to severe carpal tunnel syndrome is a suspect, I will refer the patient to a hand  surgeon to discuss treatment options. Meanwhile check a fasting lipid panel to monitor the management of his hypertriglyceridemia. Blood pressure slightly elevated today. Recommended diet and weight loss.

## 2016-12-13 ENCOUNTER — Other Ambulatory Visit: Payer: Self-pay | Admitting: Family Medicine

## 2016-12-13 DIAGNOSIS — E781 Pure hyperglyceridemia: Secondary | ICD-10-CM

## 2016-12-13 MED ORDER — FENOFIBRATE 160 MG PO TABS: 160.0000 mg | ORAL_TABLET | Freq: Every day | ORAL | 3 refills | Status: DC

## 2016-12-13 MED ORDER — OMEGA-3-ACID ETHYL ESTERS 1 G PO CAPS: 2.0000 g | ORAL_CAPSULE | Freq: Two times a day (BID) | ORAL | 3 refills | Status: DC

## 2016-12-14 ENCOUNTER — Telehealth: Payer: Self-pay | Admitting: *Deleted

## 2016-12-14 NOTE — Telephone Encounter (Signed)
Received call from patient.   States that he cannot afford Lovaza.   MD please advise.

## 2016-12-15 MED ORDER — FISH OIL 1000 MG PO CPDR: 2000.0000 mg | DELAYED_RELEASE_CAPSULE | Freq: Every day | ORAL | Status: DC

## 2016-12-15 NOTE — Telephone Encounter (Signed)
Just get OTC fish oil 2000 mg poqday

## 2017-01-25 ENCOUNTER — Ambulatory Visit (INDEPENDENT_AMBULATORY_CARE_PROVIDER_SITE_OTHER): Payer: 59 | Admitting: Family Medicine

## 2017-01-25 ENCOUNTER — Telehealth: Payer: Self-pay | Admitting: *Deleted

## 2017-01-25 ENCOUNTER — Encounter: Payer: Self-pay | Admitting: Family Medicine

## 2017-01-25 ENCOUNTER — Other Ambulatory Visit: Payer: 59

## 2017-01-25 ENCOUNTER — Encounter: Payer: Self-pay | Admitting: Neurology

## 2017-01-25 VITALS — BP 132/68 | HR 88 | Temp 98.4°F | Resp 16 | Ht 70.0 in | Wt 287.0 lb

## 2017-01-25 DIAGNOSIS — Z720 Tobacco use: Secondary | ICD-10-CM

## 2017-01-25 DIAGNOSIS — R7309 Other abnormal glucose: Secondary | ICD-10-CM

## 2017-01-25 DIAGNOSIS — J209 Acute bronchitis, unspecified: Secondary | ICD-10-CM | POA: Diagnosis not present

## 2017-01-25 DIAGNOSIS — E785 Hyperlipidemia, unspecified: Secondary | ICD-10-CM

## 2017-01-25 LAB — LIPID PANEL
Cholesterol: 198 mg/dL (ref <200)
HDL: 31 mg/dL — ABNORMAL LOW (ref >40)
LDL Cholesterol: 106 mg/dL — ABNORMAL HIGH (ref <100)
Total CHOL/HDL Ratio: 6.4 Ratio — ABNORMAL HIGH (ref <5.0)
Triglycerides: 306 mg/dL — ABNORMAL HIGH (ref <150)
VLDL: 61 mg/dL — ABNORMAL HIGH (ref <30)

## 2017-01-25 MED ORDER — PREDNISONE 20 MG PO TABS: ORAL_TABLET | ORAL | 0 refills | Status: DC

## 2017-01-25 MED ORDER — AZITHROMYCIN 250 MG PO TABS: ORAL_TABLET | ORAL | 0 refills | Status: DC

## 2017-01-25 NOTE — Progress Notes (Addendum)
   Subjective:    Patient ID: Cody Curry, male    DOB: March 29, 1971, 46 y.o.   MRN: [id number]  Patient presents for Illness (x3 days- productive cough with thick white mucus, sore throat, fatigue, dizziness)   Patient here with cough with production mild wheezing for the past 3 days. He has had some fatigue some lightheaded spells U when he was driving yesterday. He denies any fever or chills. No well sinus drainage. He does smoke. He is also under some stress as he is taking care of his 46-year-old grandson. He was briefly seen for some neuropathy symptoms in his hands which he's had for while he is waiting his nerve conduction study. He also has significant elevated cholesterol states that he when he changed his diet and start exercising he continued to gain weight. He had repeat labs done this morning he states he is taking the TriCor and the Lovaza. I did note that his fasting glucose was elevated at 118.  Review Of Systems:  GEN- + fatigue, denies  fever, weight loss,weakness, recent illness HEENT- denies eye drainage, change in vision, nasal discharge, CVS- denies chest pain, palpitations RESP- denies SOB, +cough, +wheeze ABD- denies N/V, change in stools, abd pain GU- denies dysuria, hematuria, dribbling, incontinence MSK- denies joint pain, muscle aches, injury Neuro- denies headache, +dizziness, syncope, seizure activity       Objective:    BP 132/68   Pulse 88   Temp 98.4 F (36.9 C) (Oral)   Resp 16   Ht 5\' 10"  (1.778 m)   Wt 287 lb (130.2 kg)   SpO2 96%   BMI 41.18 kg/m  GEN- NAD, alert and oriented x3 HEENT- PERRL, EOMI, non injected sclera, pink conjunctiva, MMM, oropharynx clear, TM clear bilat, no effusion Neck- Supple, no LAD  CVS- RRR, no murmur RESP-rhonchi bilat, bilat wheeze, normal WOB, no retractions  EXT- No edema Neuro- CNII-XII in tact, no nystagmus no focal deficits  Pulses- Radial, 2+        Assessment & Plan:      Problem List Items  Addressed This Visit    None    Visit Diagnoses    Acute bronchitis, unspecified organism    -  Primary   Treat with zpak, prednisone, muicinex DM. Also concerned about his elevated blood sugar in setting of the TG, obtain A1C and add CBC to his labs, continue to work on dietary changes As he's had some coughing spells with dizziness and he does drive back and forth to jobs he did have to go home early yesterday I covered this with a note as well as for the next 2 days while he gets his medications in.  I will also have a referral's nurse look into his nerve conduction and make sure we get this scheduled    Relevant Orders   CBC with Differential/Platelet   Elevated glucose       Relevant Orders   Hemoglobin A1c   CBC with Differential/Platelet   Tobacco use       discussed cessation he is cutting back      Note: This dictation was prepared with Dragon dictation along with smaller phrase technology. Any transcriptional errors that result from this process are unintentional.

## 2017-01-25 NOTE — Patient Instructions (Addendum)
Take antibiotics as prescribed/ prednisone Use robitussum DM and Mucinex DM  Nerve conduction to be set up We will call with lab results F/U pending results

## 2017-01-25 NOTE — Telephone Encounter (Signed)
Lmvm letting pt know that his info has been faxed over to [facility] and they will call pt and schedule appt.

## 2017-01-26 ENCOUNTER — Other Ambulatory Visit: Payer: Self-pay | Admitting: *Deleted

## 2017-01-26 ENCOUNTER — Encounter: Payer: Self-pay | Admitting: Family Medicine

## 2017-01-26 DIAGNOSIS — R200 Anesthesia of skin: Secondary | ICD-10-CM

## 2017-01-26 LAB — CBC WITH DIFFERENTIAL/PLATELET
Basophils Absolute: 0 cells/uL (ref 0–200)
Basophils Relative: 0 %
Eosinophils Absolute: 270 cells/uL (ref 15–500)
Eosinophils Relative: 2 %
HCT: 48.2 % (ref 38.5–50.0)
Hemoglobin: 16 g/dL (ref 13.0–17.0)
Lymphocytes Relative: 20 %
Lymphs Abs: 2700 cells/uL (ref 850–3900)
MCH: 31.7 pg (ref 27.0–33.0)
MCHC: 33.2 g/dL (ref 32.0–36.0)
MCV: 95.4 fL (ref 80.0–100.0)
MPV: 10.6 fL (ref 7.5–12.5)
Monocytes Absolute: 1215 cells/uL — ABNORMAL HIGH (ref 200–950)
Monocytes Relative: 9 %
Neutro Abs: 9315 cells/uL — ABNORMAL HIGH (ref 1500–7800)
Neutrophils Relative %: 69 %
Platelets: 199 10*3/uL (ref 140–400)
RBC: 5.05 MIL/uL (ref 4.20–5.80)
RDW: 13.5 % (ref 11.0–15.0)
WBC: 13.5 10*3/uL — ABNORMAL HIGH (ref 3.8–10.8)

## 2017-01-26 LAB — HEMOGLOBIN A1C
Hgb A1c MFr Bld: 6.9 % — ABNORMAL HIGH (ref <5.7)
Mean Plasma Glucose: 151 mg/dL

## 2017-01-26 MED ORDER — METFORMIN HCL 500 MG PO TABS: 500.0000 mg | ORAL_TABLET | Freq: Two times a day (BID) | ORAL | 3 refills | Status: DC

## 2017-01-27 ENCOUNTER — Ambulatory Visit
Admission: RE | Admit: 2017-01-27 | Discharge: 2017-01-27 | Disposition: A | Discharge status: Home / Self Care | Payer: 59 | Source: Ambulatory Visit | Attending: Family Medicine | Admitting: Family Medicine

## 2017-01-27 ENCOUNTER — Encounter: Payer: Self-pay | Admitting: Family Medicine

## 2017-01-27 ENCOUNTER — Ambulatory Visit (INDEPENDENT_AMBULATORY_CARE_PROVIDER_SITE_OTHER): Payer: 59 | Admitting: Family Medicine

## 2017-01-27 VITALS — BP 132/88 | HR 84 | Temp 99.0°F | Resp 18 | Ht 70.0 in | Wt 284.0 lb

## 2017-01-27 DIAGNOSIS — R091 Pleurisy: Secondary | ICD-10-CM

## 2017-01-27 MED ORDER — HYDROCODONE-ACETAMINOPHEN 5-325 MG PO TABS: 1.0000 | ORAL_TABLET | Freq: Four times a day (QID) | ORAL | 0 refills | Status: DC | PRN

## 2017-01-27 NOTE — Progress Notes (Signed)
Subjective:    Patient ID: Cody Curry, male    DOB: 1970-11-14, 46 y.o.   MRN: [id number]  HPI Patient was seen on the 18th and was diagnosed with bronchitis by mouth partner and was started on a Z-Pak along with some prednisone. He continues to cough. He continues to report shortness of breath. Cough is productive of white mucus. However he is now developed severe right-sided pleurisy. Is located in the axillary line around the 10th rib. He is extremely tender to touch in this area. He believes he may have fractured rib or coughing. He denies any hemoptysis. Breath sounds are equal on both sides however he does have noticeable for wheezing in all 4 lung fields. I do not appreciate any crackles Rales. Past Medical History:  Diagnosis Date  . Diabetes mellitus type 2 in obese ([place])   . Hypertriglyceridemia    No past surgical history on file. Current Outpatient Prescriptions on File Prior to Visit  Medication Sig Dispense Refill  . azithromycin (ZITHROMAX) 250 MG tablet Take 2 tablets x 1 day then 1 tablet daily for 4 days 6 tablet 0  . cyclobenzaprine (FLEXERIL) 10 MG tablet Take 1 tablet (10 mg total) by mouth 3 (three) times daily as needed for muscle spasms. 30 tablet 0  . fenofibrate 160 MG tablet Take 1 tablet (160 mg total) by mouth daily. 90 tablet 3  . metFORMIN (GLUCOPHAGE) 500 MG tablet Take 1 tablet (500 mg total) by mouth 2 (two) times daily with a meal. 180 tablet 3  . Omega-3 Fatty Acids (FISH OIL) 1000 MG CPDR Take 2,000 mg by mouth daily.    . predniSONE (DELTASONE) 20 MG tablet Take 2 tablets daily x 5 days 10 tablet 0   No current facility-administered medications on file prior to visit.    No Known Allergies Social History   Social History  . Marital status: Married    Spouse name: N/A  . Number of children: N/A  . Years of education: N/A   Occupational History  . Not on file.   Social History Main Topics  . Smoking status: Current Every Day Smoker   Packs/day: 1.50    Types: Cigarettes  . Smokeless tobacco: Never Used  . Alcohol use 0.0 oz/week     Comment: Very occasional  . Drug use: Yes     Comment: Marijuana use  . Sexual activity: Not on file   Other Topics Concern  . Not on file   Social History Narrative  . No narrative on file      Review of Systems  All other systems reviewed and are negative.      Objective:   Physical Exam  Constitutional: He appears well-developed and well-nourished. No distress.  HENT:  Right Ear: External ear normal.  Left Ear: External ear normal.  Nose: Nose normal.  Mouth/Throat: Oropharynx is clear and moist. No oropharyngeal exudate.  Eyes: Conjunctivae are normal.  Neck: Neck supple.  Cardiovascular: Normal rate, regular rhythm and normal heart sounds.   Pulmonary/Chest: No respiratory distress. He has wheezes. He has no rales. He exhibits tenderness.  Abdominal: Soft. Bowel sounds are normal.  Lymphadenopathy:    He has no cervical adenopathy.  Skin: He is not diaphoretic.  Vitals reviewed.         Assessment & Plan:  Pleurisy - Plan: DG Chest 2 View  Patient certainly seems to have bronchitis with reactive airway disease secondary to smoking. I recommended he use albuterol 2.5  mg nebs every 6 hours to improve the wheezing. Continue Z-Pak and his prednisone. I will give him Norco 5/325 one by mouth every 6 hours when necessary pain. I encouraged aggressive pulmonary hygiene. He must be diligent to avoid secondary pneumonia.  Obtain chest x-ray to rule out underlying pneumonia. Recommend smoking cessation

## 2017-01-31 ENCOUNTER — Encounter: Payer: Self-pay | Admitting: Family Medicine

## 2017-01-31 ENCOUNTER — Telehealth: Payer: Self-pay | Admitting: Family Medicine

## 2017-01-31 NOTE — Telephone Encounter (Signed)
Pt called saying he is still hurting when he is coughing so he is still taking the pain medication that we prescribed, but he has not been able to go back to work because he travels and he cannot drive with the medication, wants to know if we can give him a work note.

## 2017-01-31 NOTE — Telephone Encounter (Signed)
Pt aware and note printed, signed and left up front and pt will p/u Thursday.

## 2017-01-31 NOTE — Telephone Encounter (Signed)
Ok through Wednesday.  Return Thursday.

## 2017-02-23 ENCOUNTER — Ambulatory Visit (INDEPENDENT_AMBULATORY_CARE_PROVIDER_SITE_OTHER): Payer: 59 | Admitting: Neurology

## 2017-02-23 DIAGNOSIS — R200 Anesthesia of skin: Secondary | ICD-10-CM

## 2017-02-23 DIAGNOSIS — G5603 Carpal tunnel syndrome, bilateral upper limbs: Secondary | ICD-10-CM

## 2017-02-23 NOTE — Procedures (Addendum)
[facility]  [place]  [place], [place] [address] Tel: [phone] Fax:  [phone] Test Date:  02/23/2017  Patient: Cody Curry DOB: [birth year] Physician: Narda Amber, DO  Sex: Male Height: 5\' 10"  Ref Phys: Jenna Luo, M.D.  ID#: [id number] Temp: 35.6C Technician:    Patient Complaints: This is a 46 year-old gentleman referred for evaluation of bilateral hand and forearm paresthesias, worse on the right.  NCV & EMG Findings: Extensive electrodiagnostic testing of the right upper extremity and additional studies of the left shows:  1. Right median sensory response is absent. Left median sensory response shows prolonged peak latency (5.6 ms) and reduced amplitude (7.3 V).  Bilateral ulnar sensory responses are within normal limits. 2. 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 The left median motor response shows prolonged latency (6.1 ms) and low normal amplitude. Bilateral ulnar motor responses are within normal limits. 3. There is no evidence of acute or chronic motor axon loss changes affecting any of the tested muscles. Motor unit configuration and recruitment pattern is within normal limits.  Impression: 1. Bilateral median neuropathy at or distal to the wrist, consistent with the clinical diagnosis of carpal tunnel syndrome. Overall, these findings are severe in degree electrically and significantly worse on the right. 2. There is no evidence of a cervical radiculopathy affecting the upper extremities.  3. Incidentally, there is a right Martin-Gruber anastomosis, a normal variant.    ___________________________ Narda Amber, DO    Nerve Conduction Studies Anti Sensory Summary Table   Site NR Peak (ms) Norm Peak (ms) P-T Amp (V) Norm P-T Amp  Left Median Anti Sensory  (2nd Digit)  35.6C  Wrist    5.6 <3.4 7.3 >20  Right Median Anti Sensory (2nd Digit)  35.6C  Wrist NR  <3.4  >20  Left Ulnar Anti Sensory (5th Digit)  35.6C  Wrist    2.8 <3.1 14.4 >12  Right Ulnar Anti Sensory (5th Digit)  35.6C  Wrist    2.8 <3.1 13.6 >12   Motor Summary Table   Site NR Onset (ms) Norm Onset (ms) O-P Amp (mV) Norm O-P Amp Site1 Site2 Delta-0 (ms) Dist (cm) Vel (m/s) Norm Vel (m/s)  Left Median Motor (Abd Poll Brev)  35.6C  Wrist    6.1 <3.9 6.1 >6 Elbow Wrist 4.8 28.0 58 >50  Elbow    10.9  5.6         Right Median Motor (Abd Poll Brev)  35.6C  Wrist    8.8 <3.9 5.3 >6 Elbow Wrist 4.1 28.0 68 >50  Elbow    12.9  4.4  Ulnar-wrist crossover Elbow 8.6 0.0    Ulnar-wrist crossover    4.3  2.9         Left Ulnar Motor (Abd Dig Minimi)  35.6C  Wrist    2.3 <3.1 13.0 >7 B Elbow Wrist 4.1 23.0 56 >50  B Elbow    6.4  12.0  A Elbow B Elbow 1.8 10.0 56 >50  A Elbow    8.2  11.4         Right Ulnar Motor (Abd Dig Minimi)  35.6C  Wrist    2.1 <3.1 15.2 >7 B Elbow Wrist 4.5 25.0 56 >50  B Elbow    6.6  14.1  A Elbow B Elbow 1.6 10.0 63 >50  A Elbow    8.2  14.0          EMG   Side Muscle Ins Act Fibs Psw Fasc Number Recrt Dur Dur. Amp Amp. Poly Poly. Comment  Right 1stDorInt Nml Nml Nml Nml Nml Nml Nml Nml Nml Nml Nml Nml N/A  Right Abd Poll Brev Nml Nml Nml Nml Nml Nml Nml Nml Nml Nml Nml Nml N/A  Right PronatorTeres Nml Nml Nml Nml Nml Nml Nml Nml Nml Nml Nml Nml N/A  Right Biceps Nml Nml Nml Nml Nml Nml Nml Nml Nml Nml Nml Nml N/A  Right Triceps Nml Nml Nml Nml Nml Nml Nml Nml Nml Nml Nml Nml N/A  Right Deltoid Nml Nml Nml Nml Nml Nml Nml Nml Nml Nml Nml Nml N/A  Left 1stDorInt Nml Nml Nml Nml Nml Nml Nml Nml Nml Nml Nml Nml N/A  Left Abd Poll Brev Nml Nml Nml Nml Nml Nml Nml Nml Nml Nml Nml Nml N/A  Left PronatorTeres Nml Nml Nml Nml Nml Nml Nml Nml Nml Nml Nml Nml N/A  Left Biceps Nml Nml Nml Nml Nml Nml Nml Nml Nml Nml Nml Nml N/A  Left Triceps Nml Nml Nml  Nml Nml Nml Nml Nml Nml Nml Nml Nml N/A  Left Deltoid Nml Nml Nml Nml Nml Nml Nml Nml Nml Nml Nml Nml N/A      Waveforms:

## 2017-02-28 ENCOUNTER — Other Ambulatory Visit: Payer: Self-pay | Admitting: Family Medicine

## 2017-02-28 DIAGNOSIS — G5600 Carpal tunnel syndrome, unspecified upper limb: Secondary | ICD-10-CM

## 2017-03-20 DIAGNOSIS — M79641 Pain in right hand: Secondary | ICD-10-CM | POA: Insufficient documentation

## 2017-03-20 DIAGNOSIS — G5603 Carpal tunnel syndrome, bilateral upper limbs: Secondary | ICD-10-CM | POA: Insufficient documentation

## 2017-03-21 ENCOUNTER — Other Ambulatory Visit: Payer: Self-pay | Admitting: Orthopedic Surgery

## 2017-03-27 ENCOUNTER — Encounter (HOSPITAL_COMMUNITY): Payer: Self-pay | Admitting: Emergency Medicine

## 2017-03-27 DIAGNOSIS — E860 Dehydration: Secondary | ICD-10-CM | POA: Diagnosis present

## 2017-03-27 DIAGNOSIS — Z6837 Body mass index (BMI) 37.0-37.9, adult: Secondary | ICD-10-CM | POA: Diagnosis not present

## 2017-03-27 DIAGNOSIS — E1110 Type 2 diabetes mellitus with ketoacidosis without coma: Principal | ICD-10-CM | POA: Diagnosis present

## 2017-03-27 DIAGNOSIS — E781 Pure hyperglyceridemia: Secondary | ICD-10-CM | POA: Diagnosis not present

## 2017-03-27 DIAGNOSIS — Z79899 Other long term (current) drug therapy: Secondary | ICD-10-CM | POA: Diagnosis not present

## 2017-03-27 DIAGNOSIS — E669 Obesity, unspecified: Secondary | ICD-10-CM | POA: Diagnosis present

## 2017-03-27 DIAGNOSIS — R6510 Systemic inflammatory response syndrome (SIRS) of non-infectious origin without acute organ dysfunction: Secondary | ICD-10-CM | POA: Diagnosis present

## 2017-03-27 DIAGNOSIS — Z87891 Personal history of nicotine dependence: Secondary | ICD-10-CM | POA: Diagnosis not present

## 2017-03-27 DIAGNOSIS — R5383 Other fatigue: Secondary | ICD-10-CM | POA: Diagnosis present

## 2017-03-27 DIAGNOSIS — Z7984 Long term (current) use of oral hypoglycemic drugs: Secondary | ICD-10-CM

## 2017-03-27 NOTE — ED Triage Notes (Signed)
Pt c/o emesis, diarrhea, fatigue, blurred vision, and muscle cramps since starting metformin. Pt states it has been going on x 2 weeks.

## 2017-03-28 ENCOUNTER — Emergency Department (HOSPITAL_COMMUNITY): Payer: 59

## 2017-03-28 ENCOUNTER — Inpatient Hospital Stay (HOSPITAL_COMMUNITY): Payer: 59

## 2017-03-28 ENCOUNTER — Observation Stay (HOSPITAL_COMMUNITY)
Admission: EM | Admit: 2017-03-28 | Discharge: 2017-03-29 | DRG: 638 | Disposition: A | Discharge status: Home / Self Care | Payer: 59 | Attending: Internal Medicine | Admitting: Internal Medicine

## 2017-03-28 ENCOUNTER — Encounter (HOSPITAL_COMMUNITY): Payer: Self-pay

## 2017-03-28 DIAGNOSIS — N179 Acute kidney failure, unspecified: Secondary | ICD-10-CM | POA: Diagnosis not present

## 2017-03-28 DIAGNOSIS — E1165 Type 2 diabetes mellitus with hyperglycemia: Secondary | ICD-10-CM

## 2017-03-28 DIAGNOSIS — E1110 Type 2 diabetes mellitus with ketoacidosis without coma: Secondary | ICD-10-CM

## 2017-03-28 DIAGNOSIS — R739 Hyperglycemia, unspecified: Secondary | ICD-10-CM | POA: Diagnosis present

## 2017-03-28 DIAGNOSIS — R197 Diarrhea, unspecified: Secondary | ICD-10-CM | POA: Diagnosis present

## 2017-03-28 DIAGNOSIS — E118 Type 2 diabetes mellitus with unspecified complications: Secondary | ICD-10-CM

## 2017-03-28 HISTORY — DX: Type 2 diabetes mellitus with ketoacidosis without coma: E11.10

## 2017-03-28 LAB — COMPREHENSIVE METABOLIC PANEL
ALT: 59 U/L (ref 17–63)
AST: 32 U/L (ref 15–41)
Albumin: 4.4 g/dL (ref 3.5–5.0)
Alkaline Phosphatase: 168 U/L — ABNORMAL HIGH (ref 38–126)
Anion gap: 20 — ABNORMAL HIGH (ref 5–15)
BUN: 31 mg/dL — ABNORMAL HIGH (ref 6–20)
CO2: 19 mmol/L — ABNORMAL LOW (ref 22–32)
Calcium: 10 mg/dL (ref 8.9–10.3)
Chloride: 75 mmol/L — ABNORMAL LOW (ref 101–111)
Creatinine, Ser: 1.24 mg/dL (ref 0.61–1.24)
GFR calc Af Amer: >60 mL/min (ref >60)
GFR calc non Af Amer: >60 mL/min (ref >60)
Glucose, Bld: 919 mg/dL (ref 65–99)
Potassium: 5 mmol/L (ref 3.5–5.1)
Sodium: 114 mmol/L — CL (ref 135–145)
Total Bilirubin: 0.9 mg/dL (ref 0.3–1.2)
Total Protein: 7.1 g/dL (ref 6.5–8.1)

## 2017-03-28 LAB — BASIC METABOLIC PANEL
Anion gap: 18 — ABNORMAL HIGH (ref 5–15)
Anion gap: 11 (ref 5–15)
Anion gap: 14 (ref 5–15)
Anion gap: 13 (ref 5–15)
BUN: 29 mg/dL — ABNORMAL HIGH (ref 6–20)
BUN: 23 mg/dL — ABNORMAL HIGH (ref 6–20)
BUN: 23 mg/dL — ABNORMAL HIGH (ref 6–20)
BUN: 20 mg/dL (ref 6–20)
CO2: 18 mmol/L — ABNORMAL LOW (ref 22–32)
CO2: 25 mmol/L (ref 22–32)
CO2: 22 mmol/L (ref 22–32)
CO2: 23 mmol/L (ref 22–32)
Calcium: 9 mg/dL (ref 8.9–10.3)
Calcium: 8.7 mg/dL — ABNORMAL LOW (ref 8.9–10.3)
Calcium: 8.5 mg/dL — ABNORMAL LOW (ref 8.9–10.3)
Calcium: 8.4 mg/dL — ABNORMAL LOW (ref 8.9–10.3)
Chloride: 88 mmol/L — ABNORMAL LOW (ref 101–111)
Chloride: 96 mmol/L — ABNORMAL LOW (ref 101–111)
Chloride: 103 mmol/L (ref 101–111)
Chloride: 102 mmol/L (ref 101–111)
Creatinine, Ser: 1.14 mg/dL (ref 0.61–1.24)
Creatinine, Ser: 0.92 mg/dL (ref 0.61–1.24)
Creatinine, Ser: 1.06 mg/dL (ref 0.61–1.24)
Creatinine, Ser: 0.89 mg/dL (ref 0.61–1.24)
GFR calc Af Amer: >60 mL/min (ref >60)
GFR calc Af Amer: >60 mL/min (ref >60)
GFR calc Af Amer: >60 mL/min (ref >60)
GFR calc Af Amer: >60 mL/min (ref >60)
GFR calc non Af Amer: >60 mL/min (ref >60)
GFR calc non Af Amer: >60 mL/min (ref >60)
GFR calc non Af Amer: >60 mL/min (ref >60)
GFR calc non Af Amer: >60 mL/min (ref >60)
Glucose, Bld: 624 mg/dL (ref 65–99)
Glucose, Bld: 277 mg/dL — ABNORMAL HIGH (ref 65–99)
Glucose, Bld: 258 mg/dL — ABNORMAL HIGH (ref 65–99)
Glucose, Bld: 182 mg/dL — ABNORMAL HIGH (ref 65–99)
Potassium: 3.8 mmol/L (ref 3.5–5.1)
Potassium: 4 mmol/L (ref 3.5–5.1)
Potassium: 3.8 mmol/L (ref 3.5–5.1)
Potassium: 3.5 mmol/L (ref 3.5–5.1)
Sodium: 124 mmol/L — ABNORMAL LOW (ref 135–145)
Sodium: 132 mmol/L — ABNORMAL LOW (ref 135–145)
Sodium: 139 mmol/L (ref 135–145)
Sodium: 138 mmol/L (ref 135–145)

## 2017-03-28 LAB — GLUCOSE, CAPILLARY
Glucose-Capillary: 387 mg/dL — ABNORMAL HIGH (ref 65–99)
Glucose-Capillary: 462 mg/dL — ABNORMAL HIGH (ref 65–99)
Glucose-Capillary: 332 mg/dL — ABNORMAL HIGH (ref 65–99)
Glucose-Capillary: 337 mg/dL — ABNORMAL HIGH (ref 65–99)
Glucose-Capillary: 271 mg/dL — ABNORMAL HIGH (ref 65–99)
Glucose-Capillary: 221 mg/dL — ABNORMAL HIGH (ref 65–99)
Glucose-Capillary: 245 mg/dL — ABNORMAL HIGH (ref 65–99)
Glucose-Capillary: 252 mg/dL — ABNORMAL HIGH (ref 65–99)
Glucose-Capillary: 240 mg/dL — ABNORMAL HIGH (ref 65–99)
Glucose-Capillary: 178 mg/dL — ABNORMAL HIGH (ref 65–99)
Glucose-Capillary: 227 mg/dL — ABNORMAL HIGH (ref 65–99)
Glucose-Capillary: 182 mg/dL — ABNORMAL HIGH (ref 65–99)
Glucose-Capillary: 162 mg/dL — ABNORMAL HIGH (ref 65–99)
Glucose-Capillary: 129 mg/dL — ABNORMAL HIGH (ref 65–99)
Glucose-Capillary: 203 mg/dL — ABNORMAL HIGH (ref 65–99)

## 2017-03-28 LAB — DIFFERENTIAL
Basophils Absolute: 0 10*3/uL (ref 0.0–0.1)
Basophils Relative: 0 %
Eosinophils Absolute: 0 10*3/uL (ref 0.0–0.7)
Eosinophils Relative: 0 %
Lymphocytes Relative: 9 %
Lymphs Abs: 1.5 10*3/uL (ref 0.7–4.0)
Monocytes Absolute: 1.8 10*3/uL — ABNORMAL HIGH (ref 0.1–1.0)
Monocytes Relative: 10 %
Neutro Abs: 13.8 10*3/uL — ABNORMAL HIGH (ref 1.7–7.7)
Neutrophils Relative %: 81 %

## 2017-03-28 LAB — BLOOD GAS, VENOUS
Acid-base deficit: 7.5 mmol/L — ABNORMAL HIGH (ref 0.0–2.0)
Bicarbonate: 18.8 mmol/L — ABNORMAL LOW (ref 20.0–28.0)
O2 Saturation: 95.8 %
Patient temperature: 37
pCO2, Ven: 31.8 mmHg — ABNORMAL LOW (ref 44.0–60.0)
pH, Ven: 7.349 (ref 7.250–7.430)
pO2, Ven: 82.2 mmHg — ABNORMAL HIGH (ref 32.0–45.0)

## 2017-03-28 LAB — CBC WITH DIFFERENTIAL/PLATELET
Basophils Absolute: 0 10*3/uL (ref 0.0–0.1)
Basophils Relative: 0 %
Eosinophils Absolute: 0 10*3/uL (ref 0.0–0.7)
Eosinophils Relative: 0 %
HCT: 43.9 % (ref 39.0–52.0)
Hemoglobin: 16.2 g/dL (ref 13.0–17.0)
Lymphocytes Relative: 15 %
Lymphs Abs: 2.2 10*3/uL (ref 0.7–4.0)
MCH: 32.7 pg (ref 26.0–34.0)
MCHC: 36.9 g/dL — ABNORMAL HIGH (ref 30.0–36.0)
MCV: 88.7 fL (ref 78.0–100.0)
Monocytes Absolute: 1.2 10*3/uL — ABNORMAL HIGH (ref 0.1–1.0)
Monocytes Relative: 8 %
Neutro Abs: 11.6 10*3/uL — ABNORMAL HIGH (ref 1.7–7.7)
Neutrophils Relative %: 77 %
Platelets: 187 10*3/uL (ref 150–400)
RBC: 4.95 MIL/uL (ref 4.22–5.81)
RDW: 12.3 % (ref 11.5–15.5)
WBC: 15 10*3/uL — ABNORMAL HIGH (ref 4.0–10.5)

## 2017-03-28 LAB — HEMOGLOBIN A1C
Hgb A1c MFr Bld: 13 % — ABNORMAL HIGH (ref 4.8–5.6)
Mean Plasma Glucose: 326.4 mg/dL

## 2017-03-28 LAB — CBC
HCT: 48.7 % (ref 39.0–52.0)
Hemoglobin: 17.5 g/dL — ABNORMAL HIGH (ref 13.0–17.0)
MCH: 33.4 pg (ref 26.0–34.0)
MCHC: 35.9 g/dL (ref 30.0–36.0)
MCV: 92.9 fL (ref 78.0–100.0)
Platelets: 204 10*3/uL (ref 150–400)
RBC: 5.24 MIL/uL (ref 4.22–5.81)
RDW: 12.6 % (ref 11.5–15.5)
WBC: 17.1 10*3/uL — ABNORMAL HIGH (ref 4.0–10.5)

## 2017-03-28 LAB — URINALYSIS, ROUTINE W REFLEX MICROSCOPIC
Bacteria, UA: NONE SEEN
Bilirubin Urine: NEGATIVE
Glucose, UA: >=500 mg/dL — AB
Ketones, ur: 20 mg/dL — AB
Leukocytes, UA: NEGATIVE
Nitrite: NEGATIVE
Protein, ur: 30 mg/dL — AB
Specific Gravity, Urine: 1.027 (ref 1.005–1.030)
Squamous Epithelial / LPF: NONE SEEN
pH: 5 (ref 5.0–8.0)

## 2017-03-28 LAB — TROPONIN I: Troponin I: <0.03 ng/mL (ref <0.03)

## 2017-03-28 LAB — I-STAT CG4 LACTIC ACID, ED: Lactic Acid, Venous: 2.05 mmol/L (ref 0.5–1.9)

## 2017-03-28 LAB — MAGNESIUM: Magnesium: 2.4 mg/dL (ref 1.7–2.4)

## 2017-03-28 LAB — LIPASE, BLOOD: Lipase: 27 U/L (ref 11–51)

## 2017-03-28 LAB — PHOSPHORUS: Phosphorus: 6.8 mg/dL — ABNORMAL HIGH (ref 2.5–4.6)

## 2017-03-28 LAB — CBG MONITORING, ED
Glucose-Capillary: >600 mg/dL (ref 65–99)
Glucose-Capillary: >600 mg/dL (ref 65–99)
Glucose-Capillary: >600 mg/dL (ref 65–99)

## 2017-03-28 LAB — MRSA PCR SCREENING: MRSA by PCR: NEGATIVE

## 2017-03-28 MED ORDER — INSULIN STARTER KIT- PEN NEEDLES (ENGLISH)
1.0000 | Freq: Once | Status: AC
  Administered 2017-03-28: 1
  Filled 2017-03-28: qty 1

## 2017-03-28 MED ORDER — PIPERACILLIN-TAZOBACTAM 3.375 G IVPB
3.3750 g | Freq: Once | INTRAVENOUS | Status: AC
  Administered 2017-03-28: 3.375 g via INTRAVENOUS
  Filled 2017-03-28: qty 50

## 2017-03-28 MED ORDER — DEXTROSE-NACL 5-0.45 % IV SOLN: INTRAVENOUS | Status: DC

## 2017-03-28 MED ORDER — LIVING WELL WITH DIABETES BOOK - IN SPANISH
Freq: Once | Status: AC
  Administered 2017-03-28: 1
  Filled 2017-03-28: qty 1

## 2017-03-28 MED ORDER — SODIUM CHLORIDE 0.9 % IV SOLN
INTRAVENOUS | Status: AC
  Administered 2017-03-28: 05:00:00 via INTRAVENOUS

## 2017-03-28 MED ORDER — SODIUM CHLORIDE 0.9 % IV SOLN
INTRAVENOUS | Status: DC
  Administered 2017-03-28: 5.4 [IU]/h via INTRAVENOUS
  Administered 2017-03-28: 14.3 [IU]/h via INTRAVENOUS
  Administered 2017-03-28: 13 [IU]/h via INTRAVENOUS
  Filled 2017-03-28 (×3): qty 1

## 2017-03-28 MED ORDER — INSULIN ASPART 100 UNIT/ML ~~LOC~~ SOLN
0.0000 [IU] | Freq: Every day | SUBCUTANEOUS | Status: DC
  Administered 2017-03-28: 2 [IU] via SUBCUTANEOUS

## 2017-03-28 MED ORDER — PIPERACILLIN-TAZOBACTAM 3.375 G IVPB
3.3750 g | Freq: Three times a day (TID) | INTRAVENOUS | Status: DC
  Administered 2017-03-28 – 2017-03-29 (×3): 3.375 g via INTRAVENOUS
  Filled 2017-03-28 (×2): qty 50

## 2017-03-28 MED ORDER — GI COCKTAIL ~~LOC~~
30.0000 mL | Freq: Once | ORAL | Status: AC
  Administered 2017-03-28: 30 mL via ORAL
  Filled 2017-03-28: qty 30

## 2017-03-28 MED ORDER — ONDANSETRON HCL 4 MG/2ML IJ SOLN
4.0000 mg | Freq: Once | INTRAMUSCULAR | Status: AC
  Administered 2017-03-28: 4 mg via INTRAVENOUS
  Filled 2017-03-28: qty 2

## 2017-03-28 MED ORDER — SODIUM CHLORIDE 0.9 % IV SOLN
INTRAVENOUS | Status: DC
  Administered 2017-03-28: 07:00:00 via INTRAVENOUS

## 2017-03-28 MED ORDER — INSULIN ASPART 100 UNIT/ML ~~LOC~~ SOLN
0.0000 [IU] | Freq: Three times a day (TID) | SUBCUTANEOUS | Status: DC
  Administered 2017-03-29: 11 [IU] via SUBCUTANEOUS
  Administered 2017-03-29: 8 [IU] via SUBCUTANEOUS

## 2017-03-28 MED ORDER — HEPARIN SODIUM (PORCINE) 5000 UNIT/ML IJ SOLN
5000.0000 [IU] | Freq: Three times a day (TID) | INTRAMUSCULAR | Status: DC
Start: 2017-03-28 — End: 2017-03-29
  Administered 2017-03-28 – 2017-03-29 (×4): 5000 [IU] via SUBCUTANEOUS
  Filled 2017-03-28 (×4): qty 1

## 2017-03-28 MED ORDER — IOPAMIDOL (ISOVUE-300) INJECTION 61%
100.0000 mL | Freq: Once | INTRAVENOUS | Status: AC | PRN
  Administered 2017-03-28: 100 mL via INTRAVENOUS

## 2017-03-28 MED ORDER — SODIUM CHLORIDE 0.9 % IV BOLUS (SEPSIS)
1000.0000 mL | Freq: Once | INTRAVENOUS | Status: AC
  Administered 2017-03-28: 1000 mL via INTRAVENOUS

## 2017-03-28 MED ORDER — POTASSIUM CHLORIDE IN NACL 20-0.9 MEQ/L-% IV SOLN
INTRAVENOUS | Status: DC
  Administered 2017-03-28 – 2017-03-29 (×2): via INTRAVENOUS

## 2017-03-28 MED ORDER — LIVING WELL WITH DIABETES BOOK
Freq: Once | Status: AC
Start: 2017-03-28 — End: 2017-03-28
  Administered 2017-03-28: 1
  Filled 2017-03-28: qty 1

## 2017-03-28 MED ORDER — SODIUM CHLORIDE 0.9 % IV SOLN
INTRAVENOUS | Status: DC
  Administered 2017-03-28: 1000 mL via INTRAVENOUS

## 2017-03-28 MED ORDER — INSULIN GLARGINE 100 UNIT/ML ~~LOC~~ SOLN
20.0000 [IU] | Freq: Every day | SUBCUTANEOUS | Status: DC
  Administered 2017-03-28: 20 [IU] via SUBCUTANEOUS
  Filled 2017-03-28 (×2): qty 0.2

## 2017-03-28 MED ORDER — DEXTROSE-NACL 5-0.45 % IV SOLN
INTRAVENOUS | Status: DC
  Administered 2017-03-28: 11:00:00 via INTRAVENOUS

## 2017-03-28 NOTE — Progress Notes (Signed)
ANTIBIOTIC CONSULT NOTE  Pharmacy Consult for Zosyn Indication: Dental infection  No Known Allergies  Patient Measurements: Height: 5\' 10"  (177.8 cm) Weight: 262 lb 2 oz (118.9 kg) IBW/kg (Calculated) : 73  Vital Signs: Temp: 97.6 F (36.4 C) (09/18 0800) Temp Source: Oral (09/18 0800) BP: 140/100 (09/18 0700) Pulse Rate: 107 (09/18 0900)  Labs:  Recent Labs  03/27/17 2222 03/28/17 0419 03/28/17 0928  WBC 17.1* 15.0*  --   HGB 17.5* 16.2  --   PLT 204 187  --   CREATININE 1.24 1.14 0.92   Estimated Creatinine Clearance: 128.3 mL/min (by C-G formula based on SCr of 0.92 mg/dL).  No results for input(s): VANCOTROUGH, VANCOPEAK, VANCORANDOM, GENTTROUGH, GENTPEAK, GENTRANDOM, TOBRATROUGH, TOBRAPEAK, TOBRARND, AMIKACINPEAK, AMIKACINTROU, AMIKACIN in the last 72 hours.   Microbiology: Recent Results (from the past 720 hour(s))  Culture, blood (routine x 2)     Status: None (Preliminary result)   Collection Time: 03/28/17  4:14 AM  Result Value Ref Range Status   Specimen Description BLOOD LEFT ARM  Final   Special Requests   Final    BOTTLES DRAWN AEROBIC AND ANAEROBIC Blood Culture adequate volume   Culture NO GROWTH < 12 HOURS  Final   Report Status PENDING  Incomplete  Culture, blood (routine x 2)     Status: None (Preliminary result)   Collection Time: 03/28/17  4:19 AM  Result Value Ref Range Status   Specimen Description BLOOD LEFT ARM  Final   Special Requests   Final    BOTTLES DRAWN AEROBIC AND ANAEROBIC Blood Culture adequate volume   Culture NO GROWTH < 12 HOURS  Final   Report Status PENDING  Incomplete  MRSA PCR Screening     Status: None   Collection Time: 03/28/17  5:04 AM  Result Value Ref Range Status   MRSA by PCR NEGATIVE NEGATIVE Final    Comment:        The GeneXpert MRSA Assay (FDA approved for NASAL specimens only), is one component of a comprehensive MRSA colonization surveillance program. It is not intended to diagnose MRSA infection  nor to guide or monitor treatment for MRSA infections.    Medical History: Past Medical History:  Diagnosis Date  . Diabetes mellitus type 2 in obese ([place])   . Hypertriglyceridemia    Assessment: 46 yo diabetic male presented to the ED with fatigue, watery diarrhea, and hyperglycemia. WBCs elevated. Pharmacy has been asked to dose Zosyn for dental infection and possible intra-abdominal infection.  Goal of Therapy:  Eradicate infection  Plan:  Zosyn 3.375gm IV q8h, EID Monitor labs, progress, c/s  [place] Crane, Everett A, RPH 03/28/2017,10:57 AM

## 2017-03-28 NOTE — Progress Notes (Addendum)
Inpatient Diabetes Program Recommendations  AACE/ADA: New Consensus Statement on Inpatient Glycemic Control (2015)  Target Ranges:  Prepandial:   less than 140 mg/dL      Peak postprandial:   less than 180 mg/dL (1-2 hours)      Critically ill patients:  140 - 180 mg/dL   Results for Cody Curry, Cody Curry (MRN [id number]) as of 03/28/2017 11:12  Ref. Range 03/28/2017 03:22 03/28/2017 04:33 03/28/2017 05:44 03/28/2017 06:28 03/28/2017 07:43 03/28/2017 08:27 03/28/2017 09:27 03/28/2017 10:46  Glucose-Capillary Latest Ref Range: 65 - 99 mg/dL >600 (HH) >600 (HH) 462 (H) 387 (H) 337 (H) 332 (H) 271 (H) 245 (H)  Results for Cody Curry, Cody Curry (MRN [id number]) as of 03/28/2017 11:12  Ref. Range 03/27/2017 22:22  Glucose Latest Ref Range: 65 - 99 mg/dL 919 (HH)   Review of Glycemic Control  Diabetes history: DM2 (dx in July 2018) Outpatient Diabetes medications: Metformin 500 mg BID Current orders for Inpatient glycemic control: IV insulin drip per DKA protocol  Inpatient Diabetes Program Recommendations: Insulin - IV drip/GlucoStabilizer: Patient is currently ordered IV insulin and most current glucose 245 mg/dl. HgbA1C: Please consider ordering an A1C to evaluate glycemic control over the past 2-3 months.   NOTE: Spoke with patient (over phone; diabetes coordinator not on [employer] today) about recently new diabetes diagnosis. Patient reports that he was dx with DM in July and was started on Metformin 500 mg BID. Patient reports frequent diarrhea since starting on Metformin. Patient reports that he was told he had DM but was not educated on DM yet. Patient reports that he has a follow up appointment with his PCP this Friday 03/31/17. In reviewing chart, noted patient has recently been on Prednisone for bronchitis which contributed to hyperglycemia. Patient was not asked to check his glucose or given an Rx for testing supplies.   Discussed A1C results (6.9% on 01/25/17) and explained what an A1C is. Discussed  basic pathophysiology of DM Type 1 and 2, basic home care, importance of checking CBGs and maintaining good CBG control to prevent long-term and short-term complications. Reviewed glucose and A1C goals. Discussed impact of nutrition, exercise, stress, sickness, and medications on diabetes control.  Informed patient he should be receiving Living Well with Diabetes book and an insulin starter kit and encouraged patient to read over information when he receives it. Also ordered diabetes education videos, RD consult, and patient education by RNs. Discussed DKA protocol and transition process from IV to SQ insulin. Informed patient that he may need insulin for DM control and patient states that while he does not prefer insulin he is willing to take insulin if needed. Explained that bedside RNs will be providing additional DM education and also on insulin administration. Patient reports feeling overwhelmed about the information he has been given. Informed patient that he will be asked to check glucose 4 times per day (before meals and at bedtime) at home and asked that he keep a log book of glucose readings which he will need to take to doctor appointments. Explained how the doctor he follows up with can use the log book to continue to make DM medication adjustments if needed. Discussed being referred to an Endocrinologist (will need to be referred by PCP) and encouraged patient to discuss with PCP. Asked patient to go ahead and call [phone] number on insurance card and ask about which insulins are covered under his insurance. Asked patient to write down any questions he has as he reads over information and  diabetes coordinator will follow up with him again tomorrow.  Patient verbalized understanding of information discussed and he states that he has no further questions at this time related to diabetes.   RNs to provide ongoing basic DM education at bedside with this patient and engage patient to actively check blood  glucose and administer insulin injections.   Thanks, Barnie Alderman, RN, MSN, CDE Diabetes Coordinator Inpatient Diabetes Program [phone] (Team Pager from 8am to 5pm)

## 2017-03-28 NOTE — Progress Notes (Signed)
Initial Nutrition Assessment  DOCUMENTATION CODES:  Obesity class II    INTERVENTION:  Two attempts today to provide DM diet education. RD will follow up with patient tomorrow.   NUTRITION DIAGNOSIS:   Food and nutrition related knowledge deficit related to limited prior education as evidenced by per patient/family report.   GOAL:  Pt will comply with CHO modified diet     MONITOR:  Po intake, labs, wt trends and diet adherance    REASON FOR ASSESSMENT:  Consult Diet education    ASSESSMENT: The patient is a 46 yo male recently diagnosed with DM. Two attempts today for assessment and education but pt is very sleepy asks that RD return when he is more alert.   Diet hx: unable to obtain at this time.   Weight hx: shows an 8% (10 kg) loss within the past 2 months and is clinically significant.  Recent Labs Lab 03/27/17 2222 03/28/17 0159 03/28/17 0419  NA 114*  --  124*  K 5.0  --  3.8  CL 75*  --  88*  CO2 19*  --  18*  BUN 31*  --  29*  CREATININE 1.24  --  1.14  CALCIUM 10.0  --  9.0  MG  --  2.4  --   PHOS  --  6.8*  --   GLUCOSE 919*  --  624*   Labs: hyperglycemia, hyponatremia, hyperphosphatemia   CBG (last 3)   Recent Labs  03/28/17 0628 03/28/17 0743 03/28/17 0827  GLUCAP 387* 337* 332*   Meds: Insulin, Zosyn   Diet Order:  Diet NPO time specified  Skin:  Reviewed, no issues  Last BM:  unknown  Height:   Ht Readings from Last 1 Encounters:  03/28/17 5\' 10"  (1.778 m)    Weight:   Wt Readings from Last 1 Encounters:  03/28/17 262 lb 2 oz (118.9 kg)    Ideal Body Weight:  75 kg  BMI:  Body mass index is 37.61 kg/m.  Estimated Nutritional Needs:   Kcal:  2100-2300   Protein:  95-105 gr  Fluid:  2.1-2.3 liters daily  EDUCATION NEEDS:   Education needs addressed- Provided My Plate Handout and CHO Counting guide. In addition DM coordinator has ordered Living Well with Diabetes which is a comprehensive booklet.  Colman Cater MS,RD,CSG,LDN Office: [phone] Pager: [phone]

## 2017-03-28 NOTE — H&P (Addendum)
[facility] History and Physical  Cody Curry MWN:[id number] DOB: 01-29-1971 DOA: 03/28/2017  Referring physician:  PCP: Susy Frizzle, MD   Chief Complaint: fatigue  HPI: Cody Curry is a 46 y.o. male with past history of diabetes and hyper triglycerides presents to emergency room with chief complaint of fatigue. Patient recently diagnosed with type 2 diabetes. Started on metformin. A1c is 6.9. Has been compliant w/ medication. Began to urinate frequently. Had multiple weeks of diarrhea watery after starting metformin. No sick contacts. No recent use antibiotics. Patient did have some streaks of blood in his stools.  ED course: Blood sugar found to be well over 600. Started on hyperglycemia protocol. Hospitalist consulted for admission.   Review of Systems:  As per HPI otherwise 10 point review of systems negative.    Past Medical History:  Diagnosis Date  . Diabetes mellitus type 2 in obese ([place])   . Hypertriglyceridemia    Past Surgical History:  Procedure Laterality Date  . REPLACEMENT TOTAL KNEE     Social History:  reports that he has quit smoking. His smoking use included Cigarettes. He smoked 1.50 packs per day. He has never used smokeless tobacco. He reports that he does not drink alcohol or use drugs.  No Known Allergies  Family History  Problem Relation Age of Onset  . COPD Mother   . Stroke Mother   . Hyperlipidemia Father   . Hypertension Father   . Cancer Father        bladder  . Heart disease Father   . COPD Father      Prior to Admission medications   Medication Sig Start Date End Date Taking? Authorizing Provider  azithromycin (ZITHROMAX) 250 MG tablet Take 2 tablets x 1 day then 1 tablet daily for 4 days 01/25/17   Alycia Rossetti, MD  cyclobenzaprine (FLEXERIL) 10 MG tablet Take 1 tablet (10 mg total) by mouth 3 (three) times daily as needed for muscle spasms. 02/18/16   Susy Frizzle, MD  fenofibrate 160 MG tablet Take 1 tablet  (160 mg total) by mouth daily. 12/13/16   Susy Frizzle, MD  HYDROcodone-acetaminophen (NORCO) 5-325 MG tablet Take 1 tablet by mouth every 6 (six) hours as needed for moderate pain. 01/27/17   Susy Frizzle, MD  metFORMIN (GLUCOPHAGE) 500 MG tablet Take 1 tablet (500 mg total) by mouth 2 (two) times daily with a meal. 01/26/17   Susy Frizzle, MD  Omega-3 Fatty Acids (FISH OIL) 1000 MG CPDR Take 2,000 mg by mouth daily. 12/15/16   Susy Frizzle, MD  predniSONE (DELTASONE) 20 MG tablet Take 2 tablets daily x 5 days 01/25/17   Alycia Rossetti, MD   Physical Exam: Vitals:   03/27/17 2204 03/27/17 2205 03/28/17 0200 03/28/17 0230  BP: (!) 133/95  (!) 128/91 (!) 126/93  Pulse: (!) 127  (!) 112 (!) 110  Resp: 20  20 19   Temp: 97.9 F (36.6 C)     SpO2: 100%  92% 93%  Weight:  119.3 kg (263 lb)    Height:  5\' 10"  (1.778 m)      Wt Readings from Last 3 Encounters:  03/27/17 119.3 kg (263 lb)  01/27/17 128.8 kg (284 lb)  01/25/17 130.2 kg (287 lb)    General:  Appears calm and comfortable; A&Ox3 Eyes:  PERRL, EOMI, normal lids, iris ENT:  grossly normal hearing, lips & tongue Neck:  no LAD, masses or thyromegaly Cardiovascular:  RRR,  no m/r/g. No LE edema.  Respiratory:  CTA bilaterally, no w/r/r. Normal respiratory effort. Abdomen:  soft, ntnd Skin:  no rash or induration seen on limited exam Musculoskeletal:  grossly normal tone BUE/BLE Psychiatric:  grossly normal mood and affect, speech fluent and appropriate Neurologic:  CN 2-12 grossly intact, moves all extremities in coordinated fashion.          Labs on Admission:  Basic Metabolic Panel:  Recent Labs Lab 03/27/17 2222 03/28/17 0159  NA 114*  --   K 5.0  --   CL 75*  --   CO2 19*  --   GLUCOSE 919*  --   BUN 31*  --   CREATININE 1.24  --   CALCIUM 10.0  --   MG  --  2.4  PHOS  --  6.8*   Liver Function Tests:  Recent Labs Lab 03/27/17 2222  AST 32  ALT 59  ALKPHOS 168*  BILITOT 0.9  PROT 7.1   ALBUMIN 4.4    Recent Labs Lab 03/27/17 2222  LIPASE 27   No results for input(s): AMMONIA in the last 168 hours. CBC:  Recent Labs Lab 03/27/17 2222  WBC 17.1*  NEUTROABS 13.8*  HGB 17.5*  HCT 48.7  MCV 92.9  PLT 204   Cardiac Enzymes:  Recent Labs Lab 03/28/17 0159  TROPONINI <0.03    BNP (last 3 results) No results for input(s): BNP in the last 8760 hours.  ProBNP (last 3 results) No results for input(s): PROBNP in the last 8760 hours.   Serum creatinine: 1.24 mg/dL 03/27/17 2222 Estimated creatinine clearance: 95.3 mL/min  CBG:  Recent Labs Lab 03/28/17 0211  GLUCAP >600*    Radiological Exams on Admission: No results found.  EKG: Independently reviewed. Sinus tach.   Assessment/Plan Active Problems:   Hyperglycemia  Hyperglycemia Patient with hyperglycemia protocol Nl VBG, no acidemia  Sepsis Tachy and Elevated WBC Starting on Zosyn cover for dental infection and possible intra-abdominal infection Blood cult pending x2  Diarrhea CT abd and pelvis DDX infxn vs medication induced  HLD Cont fenofibrate  Code Status: FULL  DVT Prophylaxis: Heparin Family Communication: none at bedside Disposition Plan: Pending Improvement  Status: ICU inpt  Elwin Mocha, MD Family Medicine [facility] [email or web address] Password [email or web address]

## 2017-03-28 NOTE — Plan of Care (Signed)
Problem: Tissue Perfusion: Goal: Risk factors for ineffective tissue perfusion will decrease Outcome: Progressing Pt receiving heparin for dvt prevention

## 2017-03-28 NOTE — Plan of Care (Signed)
Problem: Metabolic: Goal: Diagnostic test results will improve Outcome: Progressing Pt remains on glucosestabilizer Goal: Complications related to the disease process, condition or treatment will be avoided or minimized Outcome: Not Progressing Pt c/o blurry eyes. Unable to read diabetic booklets  Problem: Nutritional: Goal: Maintenance of adequate nutrition will improve Outcome: Not Progressing Pt remains npo except for sips on noncaloric liquids  Problem: Physical Regulation: Goal: Will regain or maintain usual level of consciousness Outcome: Not Progressing Pt  Remains very drowsy

## 2017-03-28 NOTE — ED Notes (Signed)
Date and time results received: 03/28/17 0115 (use smartphrase ".now" to insert current time)  Test: sodium Critical Value: 114  Name of Provider Notified: dr Roxanne Mins  Orders Received? Or Actions Taken?: Actions Taken: no orders received

## 2017-03-28 NOTE — ED Provider Notes (Signed)
[facility] DEPT Provider Note   CSN: [id number] Arrival date & time: 03/27/17  2121     History   Chief Complaint Chief Complaint  Patient presents with  . Fatigue    HPI Cody Curry is a 46 y.o. male.  The history is provided by the patient.  He was started on metformin about 6 weeks ago because of elevated hemoglobin A1c. About a week after starting metformin, he started having diarrhea. He was advised that this is a known side effect of metformin and to try to bear with it. About 2 weeks ago, he started having blurred vision which is getting worse. He is also having increasing polyuria and polydipsia. 2 days ago, he started vomiting. He denies fever and shows. He has not been checking his blood glucose at home.  Past Medical History:  Diagnosis Date  . Diabetes mellitus type 2 in obese ([place])   . Hypertriglyceridemia     There are no active problems to display for this patient.   Past Surgical History:  Procedure Laterality Date  . REPLACEMENT TOTAL KNEE         Home Medications    Prior to Admission medications   Medication Sig Start Date End Date Taking? Authorizing Provider  azithromycin (ZITHROMAX) 250 MG tablet Take 2 tablets x 1 day then 1 tablet daily for 4 days 01/25/17   Alycia Rossetti, MD  cyclobenzaprine (FLEXERIL) 10 MG tablet Take 1 tablet (10 mg total) by mouth 3 (three) times daily as needed for muscle spasms. 02/18/16   Susy Frizzle, MD  fenofibrate 160 MG tablet Take 1 tablet (160 mg total) by mouth daily. 12/13/16   Susy Frizzle, MD  HYDROcodone-acetaminophen (NORCO) 5-325 MG tablet Take 1 tablet by mouth every 6 (six) hours as needed for moderate pain. 01/27/17   Susy Frizzle, MD  metFORMIN (GLUCOPHAGE) 500 MG tablet Take 1 tablet (500 mg total) by mouth 2 (two) times daily with a meal. 01/26/17   Susy Frizzle, MD  Omega-3 Fatty Acids (FISH OIL) 1000 MG CPDR Take 2,000 mg by mouth daily. 12/15/16   Susy Frizzle, MD    predniSONE (DELTASONE) 20 MG tablet Take 2 tablets daily x 5 days 01/25/17   Alycia Rossetti, MD    Family History Family History  Problem Relation Age of Onset  . COPD Mother   . Stroke Mother   . Hyperlipidemia Father   . Hypertension Father   . Cancer Father        bladder  . Heart disease Father   . COPD Father     Social History Social History  Substance Use Topics  . Smoking status: Former Smoker    Packs/day: 1.50    Types: Cigarettes  . Smokeless tobacco: Never Used  . Alcohol use No     Comment: Very occasional     Allergies   Patient has no known allergies.   Review of Systems Review of Systems  All other systems reviewed and are negative.    Physical Exam Updated Vital Signs BP (!) 133/95   Pulse (!) 127   Temp 97.9 F (36.6 C)   Resp 20   Ht 5\' 10"  (1.778 m)   Wt 119.3 kg (263 lb)   SpO2 100%   BMI 37.74 kg/m   Physical Exam  Nursing note and vitals reviewed.  46 year old male, resting comfortably and in no acute distress. Vital signs are significant tachycardia and hypertension. Oxygen  saturation is 100%, which is normal. Head is normocephalic and atraumatic. PERRLA, EOMI. Oropharynx is clear. Foundation no hemorrhage or exudate or papilledema. No evidence of lipemia [place] analysis. Neck is nontender and supple without adenopathy or JVD. Back is nontender and there is no CVA tenderness. Lungs are clear without rales, wheezes, or rhonchi. Chest is nontender. Heart is tachycardic and regular without murmur. Abdomen is soft, flat, nontender without masses or hepatosplenomegaly and peristalsis is hypoactive. Extremities have no cyanosis or edema, full range of motion is present. Skin is warm and dry without rash. Neurologic: Mental status is normal, cranial nerves are intact, there are no motor or sensory deficits.  ED Treatments / Results  Labs (all labs ordered are listed, but only abnormal results are displayed) Labs Reviewed   COMPREHENSIVE METABOLIC PANEL - Abnormal; Notable for the following:       Result Value   Sodium 114 (*)    Chloride 75 (*)    CO2 19 (*)    Glucose, Bld 919 (*)    BUN 31 (*)    Alkaline Phosphatase 168 (*)    Anion gap 20 (*)    All other components within normal limits  CBC - Abnormal; Notable for the following:    WBC 17.1 (*)    Hemoglobin 17.5 (*)    All other components within normal limits  URINALYSIS, ROUTINE W REFLEX MICROSCOPIC - Abnormal; Notable for the following:    Color, Urine STRAW (*)    Glucose, UA >=500 (*)    Hgb urine dipstick SMALL (*)    Ketones, ur 20 (*)    Protein, ur 30 (*)    All other components within normal limits  DIFFERENTIAL - Abnormal; Notable for the following:    Neutro Abs 13.8 (*)    Monocytes Absolute 1.8 (*)    All other components within normal limits  BLOOD GAS, VENOUS - Abnormal; Notable for the following:    pCO2, Ven 31.8 (*)    pO2, Ven 82.2 (*)    Bicarbonate 18.8 (*)    Acid-base deficit 7.5 (*)    All other components within normal limits  PHOSPHORUS - Abnormal; Notable for the following:    Phosphorus 6.8 (*)    All other components within normal limits  BASIC METABOLIC PANEL - Abnormal; Notable for the following:    Sodium 124 (*)    Chloride 88 (*)    CO2 18 (*)    Glucose, Bld 624 (*)    BUN 29 (*)    Anion gap 18 (*)    All other components within normal limits  CBC WITH DIFFERENTIAL/PLATELET - Abnormal; Notable for the following:    WBC 15.0 (*)    MCHC 36.9 (*)    Neutro Abs 11.6 (*)    Monocytes Absolute 1.2 (*)    All other components within normal limits  GLUCOSE, CAPILLARY - Abnormal; Notable for the following:    Glucose-Capillary 462 (*)    All other components within normal limits  GLUCOSE, CAPILLARY - Abnormal; Notable for the following:    Glucose-Capillary 387 (*)    All other components within normal limits  I-STAT CG4 LACTIC ACID, ED - Abnormal; Notable for the following:    Lactic Acid,  Venous 2.05 (*)    All other components within normal limits  CBG MONITORING, ED - Abnormal; Notable for the following:    Glucose-Capillary >600 (*)    All other components within normal limits  CBG MONITORING, ED -  Abnormal; Notable for the following:    Glucose-Capillary >600 (*)    All other components within normal limits  CBG MONITORING, ED - Abnormal; Notable for the following:    Glucose-Capillary >600 (*)    All other components within normal limits  CULTURE, BLOOD (ROUTINE X 2)  CULTURE, BLOOD (ROUTINE X 2)  MRSA PCR SCREENING  LIPASE, BLOOD  TROPONIN I  MAGNESIUM  BASIC METABOLIC PANEL  BASIC METABOLIC PANEL  BASIC METABOLIC PANEL  BASIC METABOLIC PANEL  HIV ANTIBODY (ROUTINE TESTING)    EKG  EKG Interpretation  Date/Time:  Tuesday March 28 2017 02:05:20 EDT Ventricular Rate:  112 PR Interval:    QRS Duration: 102 QT Interval:  339 QTC Calculation: 463 R Axis:   127 Text Interpretation:  Sinus tachycardia Right axis deviation Minimal ST elevation, lateral leads Baseline wander in lead(s) V3 No old tracing to compare Confirmed by Delora Fuel ([id number]) on 03/28/2017 2:08:03 AM       Radiology Dg Chest 2 View  Result Date: 03/28/2017 CLINICAL DATA:  DKA EXAM: CHEST  2 VIEW COMPARISON:  01/27/2017 FINDINGS: The heart size and mediastinal contours are within normal limits. Both lungs are clear. Mild degenerative changes of the spine. IMPRESSION: No active cardiopulmonary disease. Electronically Signed   By: Donavan Foil M.D.   On: 03/28/2017 03:21   Ct Abdomen Pelvis W Contrast  Result Date: 03/28/2017 CLINICAL DATA:  Nausea and vomiting. Diarrhea, fatigue, blurred vision, and muscle cramps since taking metformin. Symptoms for 2 weeks. EXAM: CT ABDOMEN AND PELVIS WITH CONTRAST TECHNIQUE: Multidetector CT imaging of the abdomen and pelvis was performed using the standard protocol following bolus administration of intravenous contrast. CONTRAST:  1100mL ISOVUE-300  IOPAMIDOL (ISOVUE-300) INJECTION 61% COMPARISON:  None. FINDINGS: Lower chest: The lung bases are clear. Hepatobiliary: Severe diffuse fatty infiltration of the liver. No focal lesions identified. Gallbladder and bile ducts are unremarkable. Pancreas: Unremarkable. No pancreatic ductal dilatation or surrounding inflammatory changes. Spleen: Normal in size without focal abnormality. Adrenals/Urinary Tract: Adrenal glands are unremarkable. Kidneys are normal, without renal calculi, focal lesion, or hydronephrosis. Bladder is unremarkable. Stomach/Bowel: Stomach is within normal limits. Appendix appears normal. No evidence of bowel wall thickening, distention, or inflammatory changes. Vascular/Lymphatic: Aortic atherosclerosis. No enlarged abdominal or pelvic lymph nodes. Reproductive: Prostate is unremarkable. Other: No abdominal wall hernia or abnormality. No abdominopelvic ascites. Musculoskeletal: No acute or significant osseous findings. IMPRESSION: 1. Diffuse fatty infiltration of the liver. 2. No acute process demonstrated in the abdomen or pelvis. No evidence of bowel inflammation or obstruction. 3. Aortic atherosclerosis. Electronically Signed   By: Lucienne Capers M.D.   On: 03/28/2017 03:57    Procedures Procedures (including critical care time) CRITICAL CARE Performed by: [email or web address] Total critical care time: 55 minutes Critical care time was exclusive of separately billable procedures and treating other patients. Critical care was necessary to treat or prevent imminent or life-threatening deterioration. Critical care was time spent personally by me on the following activities: development of treatment plan with patient and/or surrogate as well as nursing, discussions with consultants, evaluation of patient's response to treatment, examination of patient, obtaining history from patient or surrogate, ordering and performing treatments and interventions, ordering and review of laboratory studies,  ordering and review of radiographic studies, pulse oximetry and re-evaluation of patient's condition.  Medications Ordered in ED Medications  0.9 %  sodium chloride infusion (1,000 mLs Intravenous New Bag/Given 03/28/17 0210)  dextrose 5 %-0.45 % sodium chloride infusion (not administered)  insulin regular (  NOVOLIN R,HUMULIN R) 100 Units in sodium chloride 0.9 % 100 mL (1 Units/mL) infusion (5.4 Units/hr Intravenous New Bag/Given 03/28/17 0221)  sodium chloride 0.9 % bolus 1,000 mL (1,000 mLs Intravenous New Bag/Given 03/28/17 0140)  ondansetron (ZOFRAN) injection 4 mg (4 mg Intravenous Given 03/28/17 0215)     Initial Impression / Assessment and Plan / ED Course  I have reviewed the triage vital signs and the nursing notes.  Pertinent labs & imaging results that were available during my care of the patient were reviewed by me and considered in my medical decision making (see chart for details).  Revision, polyuria, polydipsia strongly suggestive of elevated glucose level. Blood glucoses come back at 914. Sodium is low at 114. Most of the hyponatremia is related to hyperglycemia, but there is some underlying true hyonatremia. There is evidence of acute kidney injury which is related to dehydration. Anion gap is elevated. Consider lactic acidosis secondary to metformin, but more likely early ketoacidosis. Lactic acid level and venous blood gas are noted. He decided on IV fluids and IV insulin. He will need hospital admission.  Lactic acid is minimally elevated at 2.05. This means that his elevated anion gap is entirely from ketoacidosis. Case is discussed with Dr. Aggie Moats, of [facility], who agrees to admit the patient.  Final Clinical Impressions(s) / ED Diagnoses   Final diagnoses:  Diabetic ketoacidosis without coma associated with type 2 diabetes mellitus ([place])  Acute kidney injury (nontraumatic) ([place])    New Prescriptions New Prescriptions   No medications on file     Delora Fuel, MD 87/56/43 [phone]

## 2017-03-28 NOTE — ED Notes (Signed)
Merril Abbe [phone]

## 2017-03-28 NOTE — ED Notes (Signed)
Date and time results received: 03/28/17 0115 (use smartphrase ".now" to insert current time)  Test: glucose Critical Value: 919  Name of Provider Notified: dr Roxanne Mins  Orders Received? Or Actions Taken?: Actions Taken: no orders received

## 2017-03-28 NOTE — Progress Notes (Signed)
PROGRESS NOTE  Cody Curry RPR:[id number] DOB: 1970/12/25 DOA: 03/28/2017 PCP: Susy Frizzle, MD  Brief History:  46 year old male with a history of hypertriglyceridemia who was recently diagnosed with diabetes mellitus approximately 6 weeks prior to this admission and started on metformin. Approximately 1 week after starting metformin, the patient began having diarrhea. He was advised that this is a known side effect of metformin and to try to bear with it. About 2 weeks prior to this admit, he started having blurred vision which is getting worse. He is also having increasing polyuria and polydipsia.  2 days prior to this admission, the patient began having vomiting. The patient endorses compliance with metformin. Patient denies fevers, chills, headache, chest pain, dyspnea, cough, vomiting, diarrhea, abdominal pain, dysuria, hematuria,  and melena. He states that his diarrhea had improved. He has not had any loose stools for 3-4 days prior to this admission. Previously, the patient did have some rectal bleeding when he wiped, but this has resolved. The patient does endorse dietary indiscretion. He states that "nobody has told me what not to eat".  He endorses drinking lots of orange juice and cran-grape juice and eating "fast food" often.   In the emergency department, the patient was noted to have WBC 17.1, serum glucose of 919 with anion gap of 20. Urinalysis showed ketonuria. The patient was started on IV fluids and intravenous insulin.  Assessment/Plan: DKA -patient started on IV insulin with q 1 hour CBG check and q 4 hour BMPs -pt started on aggressive fluid resuscitation -Electrolytes were monitored and repleted -transitioned to [place] insulin once anion gap closed -diet was advanced once anion gap closed -01/25/2017 hemoglobin A1c 6.9 -repeat HbA1C--  SIRS -likely due to profound volume depletion--he has been afebrile and hemodynamically stable -pt presented with  tachycardia and lactic acidosis -certainly his mildly elevated lactate may have been due to metformin -continue IVF -continue empiric abx pending culture data -no pyuria on UA -personally reviewed CXR--no edema or infiltrates -03/27/2017 CT abdomen and pelvis negative for acute findings; fatty liver  Diarrhea -resolved  Hypertriglyceridemia -restart fenofibrate, fish oil when able to tolerate po -he states he has not taken in 2-3 weeks    Disposition Plan:   Home in 1-2 days  Family Communication:  No Family at bedside--Total time spent 35 minutes.  Greater than 50% spent face to face counseling and coordinating care.  0825 to [phone]  Consultants:  none  Code Status:  FULL   DVT Prophylaxis:  [place] Heparin   Procedures: As Listed in Progress Note Above  Antibiotics: None    Subjective: Patient denies fevers, chills, headache, chest pain, dyspnea, nausea, vomiting, diarrhea, abdominal pain, dysuria, hematuria, hematochezia, and melena.   Objective: Vitals:   03/28/17 0400 03/28/17 0453 03/28/17 0509 03/28/17 0600  BP: (!) 152/103 (!) 136/94  122/90  Pulse:  (!) 109  (!) 111  Resp: 20 18    Temp:   97.6 F (36.4 C)   TempSrc:   Oral   SpO2:  93%  92%  Weight:   118.9 kg (262 lb 2 oz)   Height:   '5\' 10"'$  (1.778 m)     Intake/Output Summary (Last 24 hours) at 03/28/17 0823 Last data filed at 03/28/17 0730  Gross per 24 hour  Intake          3106.65 ml  Output  1400 ml  Net          1706.65 ml   Weight change:  Exam:   General:  Pt is alert, follows commands appropriately, not in acute distress  HEENT: No icterus, No thrush, No neck mass, [place]/AT  Cardiovascular: RRR, S1/S2, no rubs, no gallops  Respiratory: CTA bilaterally, no wheezing, no crackles, no rhonchi  Abdomen: Soft/+BS, non tender, non distended, no guarding  Extremities: No edema, No lymphangitis, No petechiae, No rashes, no synovitis   Data Reviewed: I have personally reviewed  following labs and imaging studies Basic Metabolic Panel:  Recent Labs Lab 03/27/17 2222 03/28/17 0159 03/28/17 0419  NA 114*  --  124*  K 5.0  --  3.8  CL 75*  --  88*  CO2 19*  --  18*  GLUCOSE 919*  --  624*  BUN 31*  --  29*  CREATININE 1.24  --  1.14  CALCIUM 10.0  --  9.0  MG  --  2.4  --   PHOS  --  6.8*  --    Liver Function Tests:  Recent Labs Lab 03/27/17 2222  AST 32  ALT 59  ALKPHOS 168*  BILITOT 0.9  PROT 7.1  ALBUMIN 4.4    Recent Labs Lab 03/27/17 2222  LIPASE 27   No results for input(s): AMMONIA in the last 168 hours. Coagulation Profile: No results for input(s): INR, PROTIME in the last 168 hours. CBC:  Recent Labs Lab 03/27/17 2222 03/28/17 0419  WBC 17.1* 15.0*  NEUTROABS 13.8* 11.6*  HGB 17.5* 16.2  HCT 48.7 43.9  MCV 92.9 88.7  PLT 204 187   Cardiac Enzymes:  Recent Labs Lab 03/28/17 0159  TROPONINI <0.03   BNP: Invalid input(s): POCBNP CBG:  Recent Labs Lab 03/28/17 0211 03/28/17 0322 03/28/17 0433 03/28/17 0544 03/28/17 0628  GLUCAP >600* >600* >600* 462* 387*   HbA1C: No results for input(s): HGBA1C in the last 72 hours. Urine analysis:    Component Value Date/Time   COLORURINE STRAW (A) 03/28/2017 0137   APPEARANCEUR CLEAR 03/28/2017 0137   LABSPEC 1.027 03/28/2017 0137   PHURINE 5.0 03/28/2017 0137   GLUCOSEU >=500 (A) 03/28/2017 0137   HGBUR SMALL (A) 03/28/2017 0137   BILIRUBINUR NEGATIVE 03/28/2017 0137   KETONESUR 20 (A) 03/28/2017 0137   PROTEINUR 30 (A) 03/28/2017 0137   UROBILINOGEN 0.2 06/01/2010 1236   NITRITE NEGATIVE 03/28/2017 0137   LEUKOCYTESUR NEGATIVE 03/28/2017 0137   Sepsis Labs: '@LABRCNTIP'$ (procalcitonin:4,lacticidven:4) ) Recent Results (from the past 240 hour(s))  Culture, blood (routine x 2)     Status: None (Preliminary result)   Collection Time: 03/28/17  4:14 AM  Result Value Ref Range Status   Specimen Description BLOOD LEFT ARM  Final   Special Requests   Final     BOTTLES DRAWN AEROBIC AND ANAEROBIC Blood Culture adequate volume   Culture NO GROWTH < 12 HOURS  Final   Report Status PENDING  Incomplete  Culture, blood (routine x 2)     Status: None (Preliminary result)   Collection Time: 03/28/17  4:19 AM  Result Value Ref Range Status   Specimen Description BLOOD LEFT ARM  Final   Special Requests   Final    BOTTLES DRAWN AEROBIC AND ANAEROBIC Blood Culture adequate volume   Culture NO GROWTH < 12 HOURS  Final   Report Status PENDING  Incomplete     Scheduled Meds: . heparin  5,000 Units Subcutaneous Q8H  . insulin starter kit-  pen needles  1 kit Other Once  . living well with diabetes book   Does not apply Once  . living well with diabetes book- in spanish   Does not apply Once   Continuous Infusions: . sodium chloride    . dextrose 5 % and 0.45% NaCl    . insulin (NOVOLIN-R) infusion 11.1 Units/hr (03/28/17 0730)  . piperacillin-tazobactam (ZOSYN)  IV 3.375 g (03/28/17 0530)  . piperacillin-tazobactam (ZOSYN)  IV      Procedures/Studies: Dg Chest 2 View  Result Date: 03/28/2017 CLINICAL DATA:  DKA EXAM: CHEST  2 VIEW COMPARISON:  01/27/2017 FINDINGS: The heart size and mediastinal contours are within normal limits. Both lungs are clear. Mild degenerative changes of the spine. IMPRESSION: No active cardiopulmonary disease. Electronically Signed   By: Donavan Foil M.D.   On: 03/28/2017 03:21   Ct Abdomen Pelvis W Contrast  Result Date: 03/28/2017 CLINICAL DATA:  Nausea and vomiting. Diarrhea, fatigue, blurred vision, and muscle cramps since taking metformin. Symptoms for 2 weeks. EXAM: CT ABDOMEN AND PELVIS WITH CONTRAST TECHNIQUE: Multidetector CT imaging of the abdomen and pelvis was performed using the standard protocol following bolus administration of intravenous contrast. CONTRAST:  179m ISOVUE-300 IOPAMIDOL (ISOVUE-300) INJECTION 61% COMPARISON:  None. FINDINGS: Lower chest: The lung bases are clear. Hepatobiliary: Severe diffuse  fatty infiltration of the liver. No focal lesions identified. Gallbladder and bile ducts are unremarkable. Pancreas: Unremarkable. No pancreatic ductal dilatation or surrounding inflammatory changes. Spleen: Normal in size without focal abnormality. Adrenals/Urinary Tract: Adrenal glands are unremarkable. Kidneys are normal, without renal calculi, focal lesion, or hydronephrosis. Bladder is unremarkable. Stomach/Bowel: Stomach is within normal limits. Appendix appears normal. No evidence of bowel wall thickening, distention, or inflammatory changes. Vascular/Lymphatic: Aortic atherosclerosis. No enlarged abdominal or pelvic lymph nodes. Reproductive: Prostate is unremarkable. Other: No abdominal wall hernia or abnormality. No abdominopelvic ascites. Musculoskeletal: No acute or significant osseous findings. IMPRESSION: 1. Diffuse fatty infiltration of the liver. 2. No acute process demonstrated in the abdomen or pelvis. No evidence of bowel inflammation or obstruction. 3. Aortic atherosclerosis. Electronically Signed   By: WLucienne CapersM.D.   On: 03/28/2017 03:57    Catalina Salasar, DO  [facility] Pager [phone] If 7PM-7AM, please contact night-coverage [email or web address] Password [email or web address] 03/28/2017, 8:23 AM   LOS: 0 days

## 2017-03-28 NOTE — Progress Notes (Signed)
ANTIBIOTIC CONSULT NOTE-Preliminary  Pharmacy Consult for Zosyn Indication: Dental infection  No Known Allergies  Patient Measurements: Height: 5\' 10"  (177.8 cm) Weight: 262 lb 2 oz (118.9 kg) IBW/kg (Calculated) : 73   Vital Signs: Temp: 97.6 F (36.4 C) (09/18 0509) Temp Source: Oral (09/18 0509) BP: 136/94 (09/18 0453) Pulse Rate: 109 (09/18 0453)  Labs:  Recent Labs  03/27/17 2222 03/28/17 0419  WBC 17.1* 15.0*  HGB 17.5* 16.2  PLT 204 187  CREATININE 1.24 1.14    Estimated Creatinine Clearance: 103.6 mL/min (by C-G formula based on SCr of 1.14 mg/dL).  No results for input(s): VANCOTROUGH, VANCOPEAK, VANCORANDOM, GENTTROUGH, GENTPEAK, GENTRANDOM, TOBRATROUGH, TOBRAPEAK, TOBRARND, AMIKACINPEAK, AMIKACINTROU, AMIKACIN in the last 72 hours.   Microbiology: No results found for this or any previous visit (from the past 720 hour(s)).  Medical History: Past Medical History:  Diagnosis Date  . Diabetes mellitus type 2 in obese ([place])   . Hypertriglyceridemia     Medications:  Assessment: 46 yo diabetic male presented to the ED with fatigue, watery diarrhea, and hyperglycemia. WBCs elevated. Pharmacy has been asked to dose Zosyn for dental infection and possible intra-abdominal infection.  Goal of Therapy:  Eradicate infection  Plan:  Preliminary review of pertinent patient information completed.  Protocol will be initiated with one dose of IV Zosyn 3.375 Gm.  Forestine Na clinical pharmacist will complete review during morning rounds to assess patient and finalize treatment regimen if needed.  Norberto Sorenson, [facility] 03/28/2017,5:58 AM

## 2017-03-28 NOTE — ED Notes (Signed)
Date and time results received: 03/28/17  0205 (use smartphrase ".now" to insert current time)  Test: I sta lactic  Critical Value: 2.05  Name of Provider Notified: Roxanne Mins  Orders Received? Or Actions Taken?: notified

## 2017-03-29 DIAGNOSIS — R739 Hyperglycemia, unspecified: Secondary | ICD-10-CM

## 2017-03-29 LAB — BASIC METABOLIC PANEL
Anion gap: 10 (ref 5–15)
BUN: 14 mg/dL (ref 6–20)
CO2: 23 mmol/L (ref 22–32)
Calcium: 7.6 mg/dL — ABNORMAL LOW (ref 8.9–10.3)
Chloride: 98 mmol/L — ABNORMAL LOW (ref 101–111)
Creatinine, Ser: 1.01 mg/dL (ref 0.61–1.24)
GFR calc Af Amer: >60 mL/min (ref >60)
GFR calc non Af Amer: >60 mL/min (ref >60)
Glucose, Bld: 284 mg/dL — ABNORMAL HIGH (ref 65–99)
Potassium: 3.6 mmol/L (ref 3.5–5.1)
Sodium: 131 mmol/L — ABNORMAL LOW (ref 135–145)

## 2017-03-29 LAB — CBC
HCT: 38.8 % — ABNORMAL LOW (ref 39.0–52.0)
Hemoglobin: 13.8 g/dL (ref 13.0–17.0)
MCH: 31.6 pg (ref 26.0–34.0)
MCHC: 35.6 g/dL (ref 30.0–36.0)
MCV: 88.8 fL (ref 78.0–100.0)
Platelets: 165 10*3/uL (ref 150–400)
RBC: 4.37 MIL/uL (ref 4.22–5.81)
RDW: 12.6 % (ref 11.5–15.5)
WBC: 8.9 10*3/uL (ref 4.0–10.5)

## 2017-03-29 LAB — GLUCOSE, CAPILLARY
Glucose-Capillary: 271 mg/dL — ABNORMAL HIGH (ref 65–99)
Glucose-Capillary: 345 mg/dL — ABNORMAL HIGH (ref 65–99)

## 2017-03-29 LAB — HIV ANTIBODY (ROUTINE TESTING W REFLEX): HIV Screen 4th Generation wRfx: NONREACTIVE

## 2017-03-29 LAB — MAGNESIUM: Magnesium: 1.9 mg/dL (ref 1.7–2.4)

## 2017-03-29 MED ORDER — INSULIN PEN NEEDLE 31G X 5 MM MISC: 11 refills | Status: DC

## 2017-03-29 MED ORDER — INSULIN ASPART 100 UNIT/ML CARTRIDGE (PENFILL): 0.0000 [IU] | Freq: Three times a day (TID) | SUBCUTANEOUS | 11 refills | Status: DC

## 2017-03-29 MED ORDER — BLOOD GLUCOSE MONITOR KIT: PACK | 0 refills | Status: DC

## 2017-03-29 MED ORDER — HUMALOG JUNIOR KWIKPEN 100 UNIT/ML ~~LOC~~ SOPN: 0.0000 [IU] | PEN_INJECTOR | Freq: Three times a day (TID) | SUBCUTANEOUS | 11 refills | Status: DC

## 2017-03-29 MED ORDER — INSULIN GLARGINE 100 UNITS/ML SOLOSTAR PEN: 25.0000 [IU] | PEN_INJECTOR | Freq: Every day | SUBCUTANEOUS | 11 refills | Status: DC

## 2017-03-29 MED ORDER — INSULIN DETEMIR 100 UNIT/ML FLEXPEN: 25.0000 [IU] | PEN_INJECTOR | Freq: Every day | SUBCUTANEOUS | 11 refills | Status: DC

## 2017-03-29 NOTE — Plan of Care (Signed)
Problem: Food- and Nutrition-Related Knowledge Deficit (NB-1.1) Goal: Nutrition education Formal process to instruct or train a patient/client in a skill or to impart knowledge to help patients/clients voluntarily manage or modify food choices and eating behavior to maintain or improve health. Outcome: Adequate for Discharge  RD consulted for nutrition education regarding diabetes. The patient is awake this morning and receptive to review of diabetic diet.   Lab Results  Component Value Date   HGBA1C 13.0 (H) 03/28/2017    RD provided "Carbohydrate Counting for People with Diabetes" handout from the Academy of Nutrition and Dietetics. Discussed different food groups and their effects on blood sugar, emphasizing carbohydrate-containing foods. Provided list of carbohydrates and recommended serving sizes of common foods.  Discussed importance of controlled and consistent carbohydrate intake throughout the day. Provided examples of ways to balance meals/snacks and encouraged intake of high-fiber, whole grain complex carbohydrates. Teach back method used.  Expect good compliance. The patient was able to demonstrate understanding of every cho containing food served for breakfast this morning.  Body mass index is 38.69 kg/m. Pt meets criteria for obesity class II based on current BMI.  Current diet order is CHO modified, patient is consuming approximately 100% of meals at this time. Labs and medications reviewed. No further nutrition interventions warranted at this time. RD contact information provided. If additional nutrition issues arise, please re-consult RD.  Colman Cater MS,RD,CSG,LDN Office: [phone] Pager: [phone]

## 2017-03-29 NOTE — Progress Notes (Signed)
Inpatient Diabetes Program Recommendations  AACE/ADA: New Consensus Statement on Inpatient Glycemic Control (2015)  Target Ranges:  Prepandial:   less than 140 mg/dL      Peak postprandial:   less than 180 mg/dL (1-2 hours)      Critically ill patients:  140 - 180 mg/dL  Results for MONTOYA, BRANDEL (MRN [id number]) as of 03/29/2017 10:13  Ref. Range 03/28/2017 13:29 03/28/2017 14:48 03/28/2017 15:28 03/28/2017 16:38 03/28/2017 17:38 03/28/2017 18:49 03/28/2017 21:18 03/29/2017 07:42  Glucose-Capillary Latest Ref Range: 65 - 99 mg/dL 240 (H) 227 (H) 178 (H) 182 (H) 162 (H) 129 (H) 203 (H) 271 (H)    Review of Glycemic Control  Diabetes history: DM2 Outpatient Diabetes medications: Metformin 500 mg BID Current orders for Inpatient glycemic control: Lantus 20 units QHS, Novolog 0-15 units TID with meals, Novolog 0-5 units QHS  Inpatient Diabetes Program Recommendations:  Insulin - Basal: Please consider increasing Lantus to 25 units QHS. Insulin - Meal Coverage: Please consider ordering Novolog 3 units TID with meals for meal coverage. HgbA1C: A1C 13% on 03/28/2017. Recommend discharging patient on insulin and have patient follow up with PCP regarding DM control and Endocrinologist referral.   Thanks, Barnie Alderman, RN, MSN, CDE Diabetes Coordinator Inpatient Diabetes Program [phone] (Team Pager from 8am to 5pm)

## 2017-03-29 NOTE — Progress Notes (Signed)
DISCHARGE INSTRUCTIONS GIVE. PT'S SISTER WHO IS ALSO DIABETIC AT BESIDE TO REASSURE AND AND ENCOURAGE PT. WK'G TAILORED TO MEET W/ PT'S INSURANCE.

## 2017-03-29 NOTE — Discharge Summary (Signed)
Physician Discharge Summary  Cody Curry OMV:[id number] DOB: 26-Nov-1970 DOA: 03/28/2017  PCP: Susy Frizzle, MD  Admit date: 03/28/2017 Discharge date: 03/29/2017  Time spent: 45 minutes  Recommendations for Outpatient Follow-up:  -To be discharged home today. -Will need follow-up with PCP in one week for further diabetic management.   Discharge Diagnoses:  Principal Problem:   Hyperglycemia Active Problems:   Diarrhea   DKA, type 2 ([place])   Uncontrolled type 2 diabetes mellitus with hyperglycemia, without long-term current use of insulin ([place])   Diabetic ketoacidosis without coma associated with type 2 diabetes mellitus [facility])   Discharge Condition: Stable and improved  Filed Weights   03/27/17 2205 03/28/17 0509 03/29/17 0500  Weight: 119.3 kg (263 lb) 118.9 kg (262 lb 2 oz) 122.3 kg (269 lb 10 oz)    History of present illness:  As per Dr. Aggie Moats on 9/18: Cody Curry is a 46 y.o. male with past history of diabetes and hyper triglycerides presents to emergency room with chief complaint of fatigue. Patient recently diagnosed with type 2 diabetes. Started on metformin. A1c is 6.9. Has been compliant w/ medication. Began to urinate frequently. Had multiple weeks of diarrhea watery after starting metformin. No sick contacts. No recent use antibiotics. Patient did have some streaks of blood in his stools.  ED course: Blood sugar found to be well over 600. Started on hyperglycemia protocol. Hospitalist consulted for admission.  Hospital Course:   DKA -Has resolved, has been transitioned over to subcutaneous insulin. -Has received diet education and insulin administration education. -Given his A1c of 13, I believe he needs to be discharged home on insulin in addition to his metformin. -Will be discharged on a combination of Lantus and NovoLog on a sliding scale to cover carbs.  SIRS -Not due to an infection. -Likely due to profound dehydration from DKA and lactic  acidosis. -Received Zosyn while in the hospital, will discontinue all antibiotics, no signs of acute infectious sources.  Hypertriglyceridemia -Restart fenofibrate and fish oil.  Procedures:  None   Consultations:  None  Discharge Instructions  Discharge Instructions    Increase activity slowly    Complete by:  As directed      Allergies as of 03/29/2017   No Known Allergies     Medication List    STOP taking these medications   HYDROcodone-acetaminophen 5-325 MG tablet Commonly known as:  NORCO     TAKE these medications   albuterol 108 (90 Base) MCG/ACT inhaler Commonly known as:  PROVENTIL HFA;VENTOLIN HFA Inhale 1-2 puffs into the lungs every 6 (six) hours as needed for wheezing or shortness of breath.   blood glucose meter kit and supplies Kit Dispense based on patient and insurance preference. Use up to four times daily as directed. (FOR ICD-9 250.00, 250.01).   fenofibrate 160 MG tablet Take 1 tablet (160 mg total) by mouth daily.   Fish Oil 1000 MG Cpdr Take 2,000 mg by mouth daily.   insulin aspart cartridge Commonly known as:  NOVOLOG PENFILL Inject 0-15 Units into the skin 3 (three) times daily with meals.   insulin glargine 100 unit/mL Sopn Commonly known as:  LANTUS Inject 0.25 mLs (25 Units total) into the skin at bedtime.   Insulin Pen Needle 31G X 5 MM Misc To test TID   metFORMIN 500 MG tablet Commonly known as:  GLUCOPHAGE Take 1 tablet (500 mg total) by mouth 2 (two) times daily with a meal.  Discharge Care Instructions        Start     Ordered   03/29/17 0000  insulin glargine (LANTUS) 100 unit/mL SOPN  Daily at bedtime     03/29/17 1157   03/29/17 0000  insulin aspart (NOVOLOG PENFILL) cartridge  3 times daily with meals     03/29/17 1157   03/29/17 0000  blood glucose meter kit and supplies KIT    Question Answer Comment  Number of strips 100   Number of lancets 100      03/29/17 1157   03/29/17 0000   Insulin Pen Needle 31G X 5 MM MISC     03/29/17 1157   03/29/17 0000  Increase activity slowly     03/29/17 1158     No Known Allergies Follow-up Information    Susy Frizzle, MD Follow up.   Specialty:  Family Medicine Why:  As scheduled for later this week Contact information: [address] [place] [address] [phone]            The results of significant diagnostics from this hospitalization (including imaging, microbiology, ancillary and laboratory) are listed below for reference.    Significant Diagnostic Studies: Dg Chest 2 View  Result Date: 03/28/2017 CLINICAL DATA:  DKA EXAM: CHEST  2 VIEW COMPARISON:  01/27/2017 FINDINGS: The heart size and mediastinal contours are within normal limits. Both lungs are clear. Mild degenerative changes of the spine. IMPRESSION: No active cardiopulmonary disease. Electronically Signed   By: Donavan Foil M.D.   On: 03/28/2017 03:21   Ct Abdomen Pelvis W Contrast  Result Date: 03/28/2017 CLINICAL DATA:  Nausea and vomiting. Diarrhea, fatigue, blurred vision, and muscle cramps since taking metformin. Symptoms for 2 weeks. EXAM: CT ABDOMEN AND PELVIS WITH CONTRAST TECHNIQUE: Multidetector CT imaging of the abdomen and pelvis was performed using the standard protocol following bolus administration of intravenous contrast. CONTRAST:  144m ISOVUE-300 IOPAMIDOL (ISOVUE-300) INJECTION 61% COMPARISON:  None. FINDINGS: Lower chest: The lung bases are clear. Hepatobiliary: Severe diffuse fatty infiltration of the liver. No focal lesions identified. Gallbladder and bile ducts are unremarkable. Pancreas: Unremarkable. No pancreatic ductal dilatation or surrounding inflammatory changes. Spleen: Normal in size without focal abnormality. Adrenals/Urinary Tract: Adrenal glands are unremarkable. Kidneys are normal, without renal calculi, focal lesion, or hydronephrosis. Bladder is unremarkable. Stomach/Bowel: Stomach is within normal limits.  Appendix appears normal. No evidence of bowel wall thickening, distention, or inflammatory changes. Vascular/Lymphatic: Aortic atherosclerosis. No enlarged abdominal or pelvic lymph nodes. Reproductive: Prostate is unremarkable. Other: No abdominal wall hernia or abnormality. No abdominopelvic ascites. Musculoskeletal: No acute or significant osseous findings. IMPRESSION: 1. Diffuse fatty infiltration of the liver. 2. No acute process demonstrated in the abdomen or pelvis. No evidence of bowel inflammation or obstruction. 3. Aortic atherosclerosis. Electronically Signed   By: WLucienne CapersM.D.   On: 03/28/2017 03:57    Microbiology: Recent Results (from the past 240 hour(s))  Culture, blood (routine x 2)     Status: None (Preliminary result)   Collection Time: 03/28/17  4:14 AM  Result Value Ref Range Status   Specimen Description BLOOD LEFT ARM  Final   Special Requests   Final    BOTTLES DRAWN AEROBIC AND ANAEROBIC Blood Culture adequate volume   Culture NO GROWTH 1 DAY  Final   Report Status PENDING  Incomplete  Culture, blood (routine x 2)     Status: None (Preliminary result)   Collection Time: 03/28/17  4:19 AM  Result Value Ref Range Status   Specimen Description BLOOD LEFT ARM  Final   Special Requests   Final    BOTTLES DRAWN AEROBIC AND ANAEROBIC Blood Culture adequate volume   Culture NO GROWTH 1 DAY  Final   Report Status PENDING  Incomplete  MRSA PCR Screening     Status: None   Collection Time: 03/28/17  5:04 AM  Result Value Ref Range Status   MRSA by PCR NEGATIVE NEGATIVE Final    Comment:        The GeneXpert MRSA Assay (FDA approved for NASAL specimens only), is one component of a comprehensive MRSA colonization surveillance program. It is not intended to diagnose MRSA infection nor to guide or monitor treatment for MRSA infections.      Labs: Basic Metabolic Panel:  Recent Labs Lab 03/28/17 0159 03/28/17 0419 03/28/17 0928 03/28/17 1241  03/28/17 1648 03/29/17 0408  NA  --  124* 132* 139 138 131*  K  --  3.8 4.0 3.8 3.5 3.6  CL  --  88* 96* 103 102 98*  CO2  --  18* '25 22 23 23  '$ GLUCOSE  --  624* 277* 258* 182* 284*  BUN  --  29* 23* 23* 20 14  CREATININE  --  1.14 0.92 1.06 0.89 1.01  CALCIUM  --  9.0 8.7* 8.5* 8.4* 7.6*  MG 2.4  --   --   --   --  1.9  PHOS 6.8*  --   --   --   --   --    Liver Function Tests:  Recent Labs Lab 03/27/17 2222  AST 32  ALT 59  ALKPHOS 168*  BILITOT 0.9  PROT 7.1  ALBUMIN 4.4    Recent Labs Lab 03/27/17 2222  LIPASE 27   No results for input(s): AMMONIA in the last 168 hours. CBC:  Recent Labs Lab 03/27/17 2222 03/28/17 0419 03/29/17 0408  WBC 17.1* 15.0* 8.9  NEUTROABS 13.8* 11.6*  --   HGB 17.5* 16.2 13.8  HCT 48.7 43.9 38.8*  MCV 92.9 88.7 88.8  PLT 204 187 165   Cardiac Enzymes:  Recent Labs Lab 03/28/17 0159  TROPONINI <0.03   BNP: BNP (last 3 results) No results for input(s): BNP in the last 8760 hours.  ProBNP (last 3 results) No results for input(s): PROBNP in the last 8760 hours.  CBG:  Recent Labs Lab 03/28/17 1738 03/28/17 1849 03/28/17 2118 03/29/17 0742 03/29/17 1127  GLUCAP 162* 129* 203* 271* 345*       Signed:  HERNANDEZ ACOSTA,ESTELA  [facility] Pager: [phone] 03/29/2017, 11:58 AM

## 2017-03-29 NOTE — Discharge Instructions (Signed)
Check your CBG before each meal and cover with insulin using the following scale:   If: CBG <70-120--> 0 units CBG 121-150-->2 units CBG 151-200--> 3 units CBG 201-250-->5 units CBG 251-300--> 8 units CBG 301-350--> 11 units CBG >351    -->15 units

## 2017-03-31 ENCOUNTER — Encounter: Payer: Self-pay | Admitting: Family Medicine

## 2017-03-31 ENCOUNTER — Ambulatory Visit (INDEPENDENT_AMBULATORY_CARE_PROVIDER_SITE_OTHER): Payer: 59 | Admitting: Family Medicine

## 2017-03-31 VITALS — BP 110/66 | HR 80 | Temp 98.3°F | Resp 20 | Wt 276.0 lb

## 2017-03-31 DIAGNOSIS — Z09 Encounter for follow-up examination after completed treatment for conditions other than malignant neoplasm: Secondary | ICD-10-CM | POA: Diagnosis not present

## 2017-03-31 DIAGNOSIS — E1165 Type 2 diabetes mellitus with hyperglycemia: Secondary | ICD-10-CM

## 2017-03-31 LAB — COMPLETE METABOLIC PANEL WITH GFR
AG Ratio: 1.5 (calc) (ref 1.0–2.5)
ALT: 51 U/L — ABNORMAL HIGH (ref 9–46)
AST: 44 U/L — ABNORMAL HIGH (ref 10–40)
Albumin: 3.6 g/dL (ref 3.6–5.1)
Alkaline phosphatase (APISO): 122 U/L — ABNORMAL HIGH (ref 40–115)
BUN: 14 mg/dL (ref 7–25)
CO2: 25 mmol/L (ref 20–32)
Calcium: 8.1 mg/dL — ABNORMAL LOW (ref 8.6–10.3)
Chloride: 96 mmol/L — ABNORMAL LOW (ref 98–110)
Creat: 1.02 mg/dL (ref 0.60–1.35)
GFR, Est African American: 101 mL/min/{1.73_m2} (ref >=60)
GFR, Est Non African American: 87 mL/min/{1.73_m2} (ref >=60)
Globulin: 2.4 g/dL (calc) (ref 1.9–3.7)
Glucose, Bld: 318 mg/dL — ABNORMAL HIGH (ref 65–99)
Potassium: 4.3 mmol/L (ref 3.5–5.3)
Sodium: 130 mmol/L — ABNORMAL LOW (ref 135–146)
Total Bilirubin: 0.5 mg/dL (ref 0.2–1.2)
Total Protein: 6 g/dL — ABNORMAL LOW (ref 6.1–8.1)

## 2017-03-31 LAB — CBC WITH DIFFERENTIAL/PLATELET
Basophils Absolute: 50 cells/uL (ref 0–200)
Basophils Relative: 0.7 %
Eosinophils Absolute: 180 cells/uL (ref 15–500)
Eosinophils Relative: 2.5 %
HCT: 41.2 % (ref 38.5–50.0)
Hemoglobin: 13.9 g/dL (ref 13.2–17.1)
Lymphs Abs: 2419 cells/uL (ref 850–3900)
MCH: 30.5 pg (ref 27.0–33.0)
MCHC: 33.7 g/dL (ref 32.0–36.0)
MCV: 90.4 fL (ref 80.0–100.0)
MPV: 12 fL (ref 7.5–12.5)
Monocytes Relative: 8.1 %
Neutro Abs: 3967 cells/uL (ref 1500–7800)
Neutrophils Relative %: 55.1 %
Platelets: 161 10*3/uL (ref 140–400)
RBC: 4.56 10*6/uL (ref 4.20–5.80)
RDW: 12.5 % (ref 11.0–15.0)
Total Lymphocyte: 33.6 %
WBC mixed population: 583 cells/uL (ref 200–950)
WBC: 7.2 10*3/uL (ref 3.8–10.8)

## 2017-03-31 NOTE — Progress Notes (Signed)
Subjective:     Patient ID: Cody Curry, male   DOB: Nov 09, 1970, 46 y.o.   MRN: [id number]  HPI Recently admitted to the hospital with a blood sugar greater than 600. I have copied relevant portions of his discharge summary below and included them for my reference:  Admit date: 03/28/2017 Discharge date: 03/29/2017  Time spent: 45 minutes  Recommendations for Outpatient Follow-up:  -To be discharged home today. -Will need follow-up with PCP in one week for further diabetic management.   Discharge Diagnoses:  Principal Problem:   Hyperglycemia Active Problems:   Diarrhea   DKA, type 2 ([place])   Uncontrolled type 2 diabetes mellitus with hyperglycemia, without long-term current use of insulin ([place])   Diabetic ketoacidosis without coma associated with type 2 diabetes mellitus [facility])   Discharge Condition: Stable and improved       Filed Weights   03/27/17 2205 03/28/17 0509 03/29/17 0500  Weight: 119.3 kg (263 lb) 118.9 kg (262 lb 2 oz) 122.3 kg (269 lb 10 oz)    History of present illness:  As per Dr. Aggie Moats on 9/18Nicki Reaper A Curry a 46 y.o.malewith past history of diabetes and hyper triglycerides presents to emergency room with chief complaint of fatigue. Patient recently diagnosed with type 2 diabetes. Started on metformin. A1c is 6.9. Has been compliant w/medication. Began to urinate frequently. Had multiple weeks of diarrhea watery after starting metformin. No sick contacts. No recent use antibiotics. Patient did have some streaks of blood in his stools.  ED course: Blood sugar found to be well over 600. Started on hyperglycemia protocol. Hospitalist consulted for admission.  Hospital Course:   DKA -Has resolved, has been transitioned over to subcutaneous insulin. -Has received diet education and insulin administration education. -Given his A1c of 13, I believe he needs to be discharged home on insulin in addition to his metformin. -Will be discharged on a  combination of Lantus and NovoLog on a sliding scale to cover carbs.  SIRS -Not due to an infection. -Likely due to profound dehydration from DKA and lactic acidosis. -Received Zosyn while in the hospital, will discontinue all antibiotics, no signs of acute infectious sources.  Hypertriglyceridemia -Restart fenofibrate and fish oil.  03/31/17 He is here today for follow-up. Hemoglobin A1c in July was 6.9. Patient was started on metformin 500 mg by mouth twice a day and therapeutic lifestyle changes recommended including diet and weight loss. However hemoglobin A1c on the 18th was 13 indicating rapid progression to insulin-dependent diabetes.  He is currently on Levemir 25 units daily and is using rapid acting insulin 3-4 times a day based on a sliding scale. His blood sugars have been averaging between 250 and 300 despite being on 25 units of Levemir. Therefore he is taking an additional 18 units of rapid acting insulin as a corrective factor throughout the day. Overall he feels much better. He denies any further blurry vision, polyuria, polydipsia. He continues to have some fatigue. His muscle cramps are improved dramatically. He denies any chest pain shortness of breath or dyspnea on exertion  Past Medical History:  Diagnosis Date  . Diabetes mellitus type 2 in obese ([place])   . Hypertriglyceridemia    Past Surgical History:  Procedure Laterality Date  . REPLACEMENT TOTAL KNEE     Current Outpatient Prescriptions on File Prior to Visit  Medication Sig Dispense Refill  . albuterol (PROVENTIL HFA;VENTOLIN HFA) 108 (90 Base) MCG/ACT inhaler Inhale 1-2 puffs into the lungs every 6 (six) hours  as needed for wheezing or shortness of breath.    . blood glucose meter kit and supplies KIT Dispense based on patient and insurance preference. Use up to four times daily as directed. (FOR ICD-9 250.00, 250.01). 1 each 0  . fenofibrate 160 MG tablet Take 1 tablet (160 mg total) by mouth daily. (Patient  not taking: Reported on 03/28/2017) 90 tablet 3  . Insulin Detemir (LEVEMIR) 100 UNIT/ML Pen Inject 25 Units into the skin daily at 10 pm. 3 pen 11  . insulin lispro (HUMALOG) 100 UNIT/ML KwikPen Junior Inject 0-0.15 mLs (0-15 Units total) into the skin 3 (three) times daily. 3 pen 11  . Insulin Pen Needle 31G X 5 MM MISC To test TID 100 each 11  . metFORMIN (GLUCOPHAGE) 500 MG tablet Take 1 tablet (500 mg total) by mouth 2 (two) times daily with a meal. 180 tablet 3  . Omega-3 Fatty Acids (FISH OIL) 1000 MG CPDR Take 2,000 mg by mouth daily. (Patient not taking: Reported on 03/28/2017)     No current facility-administered medications on file prior to visit.    No Known Allergies Social History   Social History  . Marital status: Married    Spouse name: N/A  . Number of children: N/A  . Years of education: N/A   Occupational History  . Not on file.   Social History Main Topics  . Smoking status: Former Smoker    Packs/day: 1.50    Types: Cigarettes  . Smokeless tobacco: Never Used  . Alcohol use No     Comment: Very occasional  . Drug use: No     Comment: quit one year ago  . Sexual activity: Not on file   Other Topics Concern  . Not on file   Social History Narrative  . No narrative on file     Review of Systems  All other systems reviewed and are negative.      Objective:   Physical Exam  Constitutional: He appears well-developed and well-nourished. No distress.  Cardiovascular: Normal rate, regular rhythm and normal heart sounds.   No murmur heard. Pulmonary/Chest: Effort normal and breath sounds normal. No respiratory distress. He has no wheezes. He has no rales.  Abdominal: Soft. Bowel sounds are normal. He exhibits no distension. There is no tenderness. There is no rebound and no guarding.  Musculoskeletal: He exhibits no edema.  Skin: He is not diaphoretic.  Vitals reviewed.      Assessment:    Hospital discharge follow-up  Uncontrolled type 2  diabetes mellitus with hyperglycemia, without long-term current use of insulin ([place])      Plan:       increase Levemir to 40 units once daily. Continue Humalog every before meals and at bedtime based on sliding scale. Hopefully by increasing his basal insulin, the amount of Humalog he requires to be gradually diminished so that we can get him on a once daily insulin shot for convenience. May consider adding felicity if he continues to have postprandial hyperglycemia despite good fasting blood sugars but we will reassess this in one week.

## 2017-04-02 LAB — CULTURE, BLOOD (ROUTINE X 2)
Culture: NO GROWTH
Culture: NO GROWTH
Special Requests: ADEQUATE
Special Requests: ADEQUATE

## 2017-04-04 ENCOUNTER — Encounter: Payer: Self-pay | Admitting: Family Medicine

## 2017-04-04 ENCOUNTER — Telehealth: Payer: Self-pay | Admitting: Family Medicine

## 2017-04-04 NOTE — Telephone Encounter (Signed)
Received BS placed on Dr. Samella Parr desk - Pt would like to know if we can write him a note for being out of work from 03/31/17 (in [place]) - 04/06/17? Ok to do?

## 2017-04-04 NOTE — Telephone Encounter (Signed)
sure

## 2017-04-04 NOTE — Telephone Encounter (Signed)
Patient would like a call back today if possible per dr pickard he said regarding his insulin  [phone]

## 2017-04-05 ENCOUNTER — Encounter: Payer: Self-pay | Admitting: Family Medicine

## 2017-04-05 NOTE — Telephone Encounter (Signed)
Pt has already received note

## 2017-04-05 NOTE — Telephone Encounter (Signed)
Pt brought in BS readings and per [facility] increase Levemir to 60 units once daily. Pt aware.

## 2017-04-07 ENCOUNTER — Ambulatory Visit: Payer: 59 | Admitting: Family Medicine

## 2017-04-10 ENCOUNTER — Ambulatory Visit (INDEPENDENT_AMBULATORY_CARE_PROVIDER_SITE_OTHER): Payer: 59 | Admitting: Family Medicine

## 2017-04-10 ENCOUNTER — Encounter: Payer: Self-pay | Admitting: Family Medicine

## 2017-04-10 VITALS — BP 118/80 | HR 104 | Temp 97.7°F | Resp 16 | Ht 70.0 in | Wt 272.0 lb

## 2017-04-10 DIAGNOSIS — E1165 Type 2 diabetes mellitus with hyperglycemia: Secondary | ICD-10-CM | POA: Diagnosis not present

## 2017-04-10 DIAGNOSIS — R091 Pleurisy: Secondary | ICD-10-CM

## 2017-04-10 NOTE — Progress Notes (Signed)
Subjective:     Patient ID: Cody Curry, male   DOB: [birth year], 46 y.o.   MRN: [id number]  HPI  03/31/17  He is here today for follow-up. Hemoglobin A1c in July was 6.9. Patient was started on metformin 500 mg by mouth twice a day and therapeutic lifestyle changes recommended including diet and weight loss. However hemoglobin A1c on the 18th was 13 indicating rapid progression to insulin-dependent diabetes.  He is currently on Levemir 25 units daily and is using rapid acting insulin 3-4 times a day based on a sliding scale. His blood sugars have been averaging between 250 and 300 despite being on 25 units of Levemir. Therefore he is taking an additional 18 units of rapid acting insulin as a corrective factor throughout the day. Overall he feels much better. He denies any further blurry vision, polyuria, polydipsia. He continues to have some fatigue. His muscle cramps are improved dramatically. He denies any chest pain shortness of breath or dyspnea on exertion.  At that time, my plan was: Increase Levemir to 40 units once daily. Continue Humalog every before meals and at bedtime based on sliding scale. Hopefully by increasing his basal insulin, the amount of Humalog he requires to be gradually diminished so that we can get him on a once daily insulin shot for convenience. May consider adding trulicity if he continues to  have postprandial hyperglycemia despite good fasting blood sugars but we will reassess this in one week.  04/10/17 Late last week levemir was increased to 60 units once daily as his sugars were averaging 200-250 and he was requiring 18 units of rapid insulin per day in addition to 40 untis of levemir.  Since switching his insulin dose, his fasting blood sugars have been just above 120 and his two-hour postprandial sugars are under 200. He would like to monitor them for an additional week prior to making any further changes. I believe it's reasonable. I saw the patient originally in July  for pleurisy after he had bronchitis. I suspected a rib injury. Patient symptoms had completely subsided. Yesterday he was coughing hard and the pain returned in the exact same spot. Pain is located in the axillary line on the right side over his night for 10th rib area. He is mildly tender to palpation in that area. He reports pleurisy. He denies any hemoptysis or shortness of breath or dyspnea on exertion. Past Medical History:  Diagnosis Date  . Diabetes mellitus type 2 in obese ([place])   . Hypertriglyceridemia    Past Surgical History:  Procedure Laterality Date  . REPLACEMENT TOTAL KNEE     Current Outpatient Prescriptions on File Prior to Visit  Medication Sig Dispense Refill  . albuterol (PROVENTIL HFA;VENTOLIN HFA) 108 (90 Base) MCG/ACT inhaler Inhale 1-2 puffs into the lungs every 6 (six) hours as needed for wheezing or shortness of breath.    . blood glucose meter kit and supplies KIT Dispense based on patient and insurance preference. Use up to four times daily as directed. (FOR ICD-9 250.00, 250.01). 1 each 0  . fenofibrate 160 MG tablet Take 1 tablet (160 mg total) by mouth daily. 90 tablet 3  . Insulin Detemir (LEVEMIR) 100 UNIT/ML Pen Inject 25 Units into the skin daily at 10 pm. 3 pen 11  . insulin lispro (HUMALOG) 100 UNIT/ML KwikPen Junior Inject 0-0.15 mLs (0-15 Units total) into the skin 3 (three) times daily. 3 pen 11  . Insulin Pen Needle 31G X 5 MM MISC To  test TID 100 each 11  . metFORMIN (GLUCOPHAGE) 500 MG tablet Take 1 tablet (500 mg total) by mouth 2 (two) times daily with a meal. 180 tablet 3  . Omega-3 Fatty Acids (FISH OIL) 1000 MG CPDR Take 2,000 mg by mouth daily.     No current facility-administered medications on file prior to visit.    No Known Allergies Social History   Social History  . Marital status: Married    Spouse name: N/A  . Number of children: N/A  . Years of education: N/A   Occupational History  . Not on file.   Social History Main  Topics  . Smoking status: Former Smoker    Packs/day: 1.50    Types: Cigarettes  . Smokeless tobacco: Never Used  . Alcohol use No     Comment: Very occasional  . Drug use: No     Comment: quit one year ago  . Sexual activity: Not on file   Other Topics Concern  . Not on file   Social History Narrative  . No narrative on file     Review of Systems  All other systems reviewed and are negative.      Objective:   Physical Exam  Constitutional: He appears well-developed and well-nourished. No distress.  Cardiovascular: Normal rate, regular rhythm and normal heart sounds.   No murmur heard. Pulmonary/Chest: Effort normal. No respiratory distress. He has no wheezes. He has no rales. He exhibits tenderness.    Abdominal: Soft. Bowel sounds are normal. He exhibits no distension. There is tenderness. There is no rebound and no guarding.  Musculoskeletal: He exhibits no edema.  Skin: He is not diaphoretic.  Vitals reviewed.      Assessment:  Uncontrolled type 2 diabetes mellitus with hyperglycemia, without long-term current use of insulin ([place])  Pleurisy   Plan:    blood sugars almost sound controlled. Recheck in one week. At that point, if he is continuing to have episodes of hyperglycemia, rather than increase his insulin, I would add trulicity and try to remove fast acting insulin. He would therefore then be on basal levemir and trulicity.  I believe he has reinjured his ribs. I do not believe there is a fracture. Instead I believe is an intercostal strain. I recommended 2 weeks of no heavy lifting and I suspect that the pain will subside gradually on its own during that timeframe

## 2017-04-13 ENCOUNTER — Ambulatory Visit: Payer: 59 | Admitting: Family Medicine

## 2017-04-18 ENCOUNTER — Encounter (HOSPITAL_BASED_OUTPATIENT_CLINIC_OR_DEPARTMENT_OTHER): Payer: Self-pay

## 2017-04-18 ENCOUNTER — Ambulatory Visit (HOSPITAL_BASED_OUTPATIENT_CLINIC_OR_DEPARTMENT_OTHER): Admit: 2017-04-18 | Payer: 59 | Admitting: Orthopedic Surgery

## 2017-04-18 SURGERY — CARPAL TUNNEL RELEASE
Anesthesia: Regional | Laterality: Right

## 2017-04-24 ENCOUNTER — Telehealth: Payer: Self-pay | Admitting: Family Medicine

## 2017-04-24 NOTE — Telephone Encounter (Signed)
Patient needs refill on his levemir to be sent to [facility]

## 2017-04-25 MED ORDER — INSULIN DETEMIR 100 UNIT/ML FLEXPEN: 25.0000 [IU] | PEN_INJECTOR | Freq: Every day | SUBCUTANEOUS | 11 refills | Status: DC

## 2017-04-25 NOTE — Telephone Encounter (Signed)
rx sent to pharmacy

## 2017-05-05 ENCOUNTER — Telehealth: Payer: Self-pay | Admitting: Family Medicine

## 2017-05-05 ENCOUNTER — Other Ambulatory Visit: Payer: 59

## 2017-05-05 DIAGNOSIS — R945 Abnormal results of liver function studies: Principal | ICD-10-CM

## 2017-05-05 DIAGNOSIS — R7989 Other specified abnormal findings of blood chemistry: Secondary | ICD-10-CM

## 2017-05-05 DIAGNOSIS — R739 Hyperglycemia, unspecified: Secondary | ICD-10-CM

## 2017-05-05 MED ORDER — INSULIN DETEMIR 100 UNIT/ML FLEXPEN: 60.0000 [IU] | PEN_INJECTOR | Freq: Every day | SUBCUTANEOUS | 3 refills | Status: DC

## 2017-05-05 NOTE — Telephone Encounter (Signed)
Pt came in for labs and wanted to let us that since dr. Dennard Schaumann uped his levemir from 28 to 27, he continues to run out quite frequently and wants [place] to make sure that he can get twice as many pens. Still using [facility].

## 2017-05-05 NOTE — Telephone Encounter (Signed)
Increase dose requires more pen - rx sent for 6 pens may need to increase if dose increases.

## 2017-05-06 LAB — COMPLETE METABOLIC PANEL WITH GFR
AG Ratio: 1.5 (calc) (ref 1.0–2.5)
ALT: 30 U/L (ref 9–46)
AST: 32 U/L (ref 10–40)
Albumin: 4.5 g/dL (ref 3.6–5.1)
Alkaline phosphatase (APISO): 73 U/L (ref 40–115)
BUN: 22 mg/dL (ref 7–25)
CO2: 27 mmol/L (ref 20–32)
Calcium: 9.6 mg/dL (ref 8.6–10.3)
Chloride: 101 mmol/L (ref 98–110)
Creat: 1.32 mg/dL (ref 0.60–1.35)
GFR, Est African American: 74 mL/min/{1.73_m2} (ref >=60)
GFR, Est Non African American: 64 mL/min/{1.73_m2} (ref >=60)
Globulin: 3.1 g/dL (calc) (ref 1.9–3.7)
Glucose, Bld: 102 mg/dL — ABNORMAL HIGH (ref 65–99)
Potassium: 4.7 mmol/L (ref 3.5–5.3)
Sodium: 138 mmol/L (ref 135–146)
Total Bilirubin: 0.6 mg/dL (ref 0.2–1.2)
Total Protein: 7.6 g/dL (ref 6.1–8.1)

## 2017-08-22 ENCOUNTER — Other Ambulatory Visit: Payer: Self-pay | Admitting: Family Medicine

## 2017-08-24 ENCOUNTER — Other Ambulatory Visit: Payer: Self-pay | Admitting: Family Medicine

## 2017-11-17 ENCOUNTER — Encounter: Payer: Self-pay | Admitting: Family Medicine

## 2017-11-17 ENCOUNTER — Ambulatory Visit (INDEPENDENT_AMBULATORY_CARE_PROVIDER_SITE_OTHER): Payer: BLUE CROSS/BLUE SHIELD | Admitting: Family Medicine

## 2017-11-17 VITALS — BP 160/84 | HR 72 | Temp 98.2°F | Resp 18 | Ht 70.0 in | Wt 291.0 lb

## 2017-11-17 DIAGNOSIS — E118 Type 2 diabetes mellitus with unspecified complications: Secondary | ICD-10-CM | POA: Diagnosis not present

## 2017-11-17 DIAGNOSIS — Z794 Long term (current) use of insulin: Secondary | ICD-10-CM

## 2017-11-17 DIAGNOSIS — E0822 Diabetes mellitus due to underlying condition with diabetic chronic kidney disease: Secondary | ICD-10-CM | POA: Diagnosis not present

## 2017-11-17 DIAGNOSIS — N182 Chronic kidney disease, stage 2 (mild): Secondary | ICD-10-CM | POA: Diagnosis not present

## 2017-11-17 MED ORDER — LOSARTAN POTASSIUM 50 MG PO TABS: 50.0000 mg | ORAL_TABLET | Freq: Every day | ORAL | 3 refills | Status: DC

## 2017-11-17 NOTE — Progress Notes (Signed)
Subjective:     Patient ID: Cody Curry, male   DOB: 18-Nov-1970, 47 y.o.   MRN: [id number]  HPI  03/31/17  He is here today for follow-up. Hemoglobin A1c in July was 6.9. Patient was started on metformin 500 mg by mouth twice a day and therapeutic lifestyle changes recommended including diet and weight loss. However hemoglobin A1c on the 18th was 13 indicating rapid progression to insulin-dependent diabetes.  He is currently on Levemir 25 units daily and is using rapid acting insulin 3-4 times a day based on a sliding scale. His blood sugars have been averaging between 250 and 300 despite being on 25 units of Levemir. Therefore he is taking an additional 18 units of rapid acting insulin as a corrective factor throughout the day. Overall he feels much better. He denies any further blurry vision, polyuria, polydipsia. He continues to have some fatigue. His muscle cramps are improved dramatically. He denies any chest pain shortness of breath or dyspnea on exertion.  At that time, my plan was: Increase Levemir to 40 units once daily. Continue Humalog every before meals and at bedtime based on sliding scale. Hopefully by increasing his basal insulin, the amount of Humalog he requires to be gradually diminished so that we can get him on a once daily insulin shot for convenience. May consider adding trulicity if he continues to  have postprandial hyperglycemia despite good fasting blood sugars but we will reassess this in one week.  04/10/17 Late last week levemir was increased to 60 units once daily as his sugars were averaging 200-250 and he was requiring 18 units of rapid insulin per day in addition to 40 untis of levemir.  Since switching his insulin dose, his fasting blood sugars have been just above 120 and his two-hour postprandial sugars are under 200. He would like to monitor them for an additional week prior to making any further changes. I believe it's reasonable. I saw the patient originally in July  for pleurisy after he had bronchitis. I suspected a rib injury. Patient symptoms had completely subsided. Yesterday he was coughing hard and the pain returned in the exact same spot. Pain is located in the axillary line on the right side over his night for 10th rib area. He is mildly tender to palpation in that area. He reports pleurisy. He denies any hemoptysis or shortness of breath or dyspnea on exertion.  At that time, my plan was: blood sugars almost sound controlled. Recheck in one week. At that point, if he is continuing to have episodes of hyperglycemia, rather than increase his insulin, I would add trulicity and try to remove fast acting insulin. He would therefore then be on basal levemir and trulicity.  I believe he has reinjured his ribs. I do not believe there is a fracture. Instead I believe is an intercostal strain. I recommended 2 weeks of no heavy lifting and I suspect that the pain will subside gradually on its own during that timeframe  11/17/17 Patient is here today for follow-up of his diabetes.  He is currently on Levemir 60 units every morning.  His fasting sugars in the morning up until 3 weeks ago have been well controlled between 101 130.  3 weeks ago, his sugars have risen from 1 20-1 60.  He is not checking sugars later in the day or postprandial sugars.  He is not taking fast acting insulin with meals.  He denies any polyuria, polydipsia, or blurry vision.  He is compliant  with his fenofibrate and his metformin.  He is gained 20 pounds roughly since his last office visit.  He denies any chest pain shortness of breath or dyspnea on exertion.  Unfortunately today his blood pressure is extremely high 160/84. Past Medical History:  Diagnosis Date  . Diabetes mellitus type 2 in obese ([place])   . Hypertriglyceridemia    Past Surgical History:  Procedure Laterality Date  . REPLACEMENT TOTAL KNEE     Current Outpatient Medications on File Prior to Visit  Medication Sig Dispense  Refill  . albuterol (PROVENTIL HFA;VENTOLIN HFA) 108 (90 Base) MCG/ACT inhaler Inhale 1-2 puffs into the lungs every 6 (six) hours as needed for wheezing or shortness of breath.    . blood glucose meter kit and supplies KIT Dispense based on patient and insurance preference. Use up to four times daily as directed. (FOR ICD-9 250.00, 250.01). 1 each 0  . fenofibrate 160 MG tablet Take 1 tablet (160 mg total) by mouth daily. 90 tablet 3  . insulin lispro (HUMALOG) 100 UNIT/ML KwikPen Junior Inject 0-0.15 mLs (0-15 Units total) into the skin 3 (three) times daily. 3 pen 11  . Insulin Pen Needle 31G X 5 MM MISC To test TID 100 each 11  . LEVEMIR FLEXTOUCH 100 UNIT/ML Pen INJECT 60 UNITS INTO THE SKIN DAILY AT 10 PM. (Patient taking differently: INJECT 58 UNITS INTO THE SKIN DAILY AT 10 PM.) 15 pen 3  . metFORMIN (GLUCOPHAGE) 500 MG tablet Take 1 tablet (500 mg total) by mouth 2 (two) times daily with a meal. 180 tablet 3  . omega-3 acid ethyl esters (LOVAZA) 1 g capsule TAKE 2 CAPSULES (2 G TOTAL) BY MOUTH 2 (TWO) TIMES DAILY. 180 capsule 3  . Omega-3 Fatty Acids (FISH OIL) 1000 MG CPDR Take 2,000 mg by mouth daily.     No current facility-administered medications on file prior to visit.    No Known Allergies Social History   Socioeconomic History  . Marital status: Married    Spouse name: Not on file  . Number of children: Not on file  . Years of education: Not on file  . Highest education level: Not on file  Occupational History  . Not on file  Social Needs  . Financial resource strain: Not on file  . Food insecurity:    Worry: Not on file    Inability: Not on file  . Transportation needs:    Medical: Not on file    Non-medical: Not on file  Tobacco Use  . Smoking status: Former Smoker    Packs/day: 1.50    Types: Cigarettes  . Smokeless tobacco: Never Used  Substance and Sexual Activity  . Alcohol use: No    Alcohol/week: 0.0 oz    Comment: Very occasional  . Drug use: No     Comment: quit one year ago  . Sexual activity: Not on file  Lifestyle  . Physical activity:    Days per week: Not on file    Minutes per session: Not on file  . Stress: Not on file  Relationships  . Social connections:    Talks on phone: Not on file    Gets together: Not on file    Attends religious service: Not on file    Active member of club or organization: Not on file    Attends meetings of clubs or organizations: Not on file    Relationship status: Not on file  . Intimate partner violence:  Fear of current or ex partner: Not on file    Emotionally abused: Not on file    Physically abused: Not on file    Forced sexual activity: Not on file  Other Topics Concern  . Not on file  Social History Narrative  . Not on file     Review of Systems  All other systems reviewed and are negative.      Objective:   Physical Exam  Constitutional: He appears well-developed and well-nourished. No distress.  Cardiovascular: Normal rate, regular rhythm and normal heart sounds.  No murmur heard. Pulmonary/Chest: Effort normal. No respiratory distress. He has no wheezes. He has no rales. He exhibits no tenderness.  Abdominal: Soft. Bowel sounds are normal. He exhibits no distension. There is no tenderness. There is no rebound and no guarding.  Musculoskeletal: He exhibits no edema.  Skin: He is not diaphoretic.  Vitals reviewed.      Assessment:  Type 2 diabetes mellitus with complication, with long-term current use of insulin ([place]) - Plan: CBC with Differential/Platelet, COMPLETE METABOLIC PANEL WITH GFR, Lipid panel, Microalbumin, urine, Hemoglobin A1c, losartan (COZAAR) 50 MG tablet    Plan:      Check hemoglobin A1c.  If greater than 7.5, I would recommending adding Trulicity to better manage his postprandial sugars.  However as I explained to the patient it is possible that his sugars may be extremely low later in the day causing him to feel weak and tired.  Since he is not  checking his sugars, we are uncertain of what is happening later in the day.  If his hemoglobin A1c is less than 6.5, we likely will be able to discontinue insulin and replace it with Trulicity.  Therefore I will await the results of the hemoglobin A1c.  I will also check a fasting lipid panel.  Patient should be on a statin given his diabetes.  If his triglycerides are well controlled, I would like to discontinue fenofibrate and replaced with statin therapy.  His blood pressure today is extremely high.  Therefore I will add losartan 50 mg p.o. daily and recheck blood pressure in 1 month.

## 2017-11-18 LAB — CBC WITH DIFFERENTIAL/PLATELET
Basophils Absolute: 72 cells/uL (ref 0–200)
Basophils Relative: 0.7 %
Eosinophils Absolute: 144 cells/uL (ref 15–500)
Eosinophils Relative: 1.4 %
HCT: 43.5 % (ref 38.5–50.0)
Hemoglobin: 15 g/dL (ref 13.2–17.1)
Lymphs Abs: 3739 cells/uL (ref 850–3900)
MCH: 31 pg (ref 27.0–33.0)
MCHC: 34.5 g/dL (ref 32.0–36.0)
MCV: 89.9 fL (ref 80.0–100.0)
MPV: 10.1 fL (ref 7.5–12.5)
Monocytes Relative: 9 %
Neutro Abs: 5418 cells/uL (ref 1500–7800)
Neutrophils Relative %: 52.6 %
Platelets: 223 10*3/uL (ref 140–400)
RBC: 4.84 10*6/uL (ref 4.20–5.80)
RDW: 12.1 % (ref 11.0–15.0)
Total Lymphocyte: 36.3 %
WBC mixed population: 927 cells/uL (ref 200–950)
WBC: 10.3 10*3/uL (ref 3.8–10.8)

## 2017-11-18 LAB — COMPLETE METABOLIC PANEL WITH GFR
AG Ratio: 1.7 (calc) (ref 1.0–2.5)
ALT: 38 U/L (ref 9–46)
AST: 28 U/L (ref 10–40)
Albumin: 4.7 g/dL (ref 3.6–5.1)
Alkaline phosphatase (APISO): 63 U/L (ref 40–115)
BUN: 19 mg/dL (ref 7–25)
CO2: 27 mmol/L (ref 20–32)
Calcium: 9.6 mg/dL (ref 8.6–10.3)
Chloride: 103 mmol/L (ref 98–110)
Creat: 1.19 mg/dL (ref 0.60–1.35)
GFR, Est African American: 83 mL/min/{1.73_m2} (ref >=60)
GFR, Est Non African American: 72 mL/min/{1.73_m2} (ref >=60)
Globulin: 2.7 g/dL (calc) (ref 1.9–3.7)
Glucose, Bld: 100 mg/dL — ABNORMAL HIGH (ref 65–99)
Potassium: 4.5 mmol/L (ref 3.5–5.3)
Sodium: 140 mmol/L (ref 135–146)
Total Bilirubin: 0.4 mg/dL (ref 0.2–1.2)
Total Protein: 7.4 g/dL (ref 6.1–8.1)

## 2017-11-18 LAB — LIPID PANEL
Cholesterol: 200 mg/dL — ABNORMAL HIGH (ref <200)
HDL: 38 mg/dL — ABNORMAL LOW (ref >40)
LDL Cholesterol (Calc): 122 mg/dL (calc) — ABNORMAL HIGH
Non-HDL Cholesterol (Calc): 162 mg/dL (calc) — ABNORMAL HIGH (ref <130)
Total CHOL/HDL Ratio: 5.3 (calc) — ABNORMAL HIGH (ref <5.0)
Triglycerides: 260 mg/dL — ABNORMAL HIGH (ref <150)

## 2017-11-18 LAB — HEMOGLOBIN A1C
Hgb A1c MFr Bld: 6.7 % of total Hgb — ABNORMAL HIGH (ref <5.7)
Mean Plasma Glucose: 146 (calc)
eAG (mmol/L): 8.1 (calc)

## 2017-11-19 LAB — MICROALBUMIN, URINE: Microalb, Ur: 0.8 mg/dL

## 2017-11-22 ENCOUNTER — Other Ambulatory Visit: Payer: Self-pay | Admitting: Family Medicine

## 2017-11-22 MED ORDER — ATORVASTATIN CALCIUM 40 MG PO TABS: 40.0000 mg | ORAL_TABLET | Freq: Every day | ORAL | 3 refills | Status: DC

## 2017-11-22 MED ORDER — DULAGLUTIDE 0.75 MG/0.5ML ~~LOC~~ SOAJ: 0.7500 mg | SUBCUTANEOUS | 1 refills | Status: DC

## 2017-12-08 ENCOUNTER — Other Ambulatory Visit: Payer: Self-pay | Admitting: Family Medicine

## 2017-12-22 ENCOUNTER — Ambulatory Visit: Payer: BLUE CROSS/BLUE SHIELD | Admitting: Family Medicine

## 2017-12-22 VITALS — BP 120/80 | HR 62 | Temp 97.7°F | Wt 292.0 lb

## 2017-12-22 DIAGNOSIS — E118 Type 2 diabetes mellitus with unspecified complications: Secondary | ICD-10-CM

## 2017-12-22 DIAGNOSIS — Z794 Long term (current) use of insulin: Secondary | ICD-10-CM | POA: Diagnosis not present

## 2017-12-22 MED ORDER — DULAGLUTIDE 1.5 MG/0.5ML ~~LOC~~ SOAJ: 1.5000 mg | SUBCUTANEOUS | 11 refills | Status: DC

## 2017-12-22 NOTE — Progress Notes (Signed)
Subjective:     Patient ID: Cody Curry, male   DOB: [birth year], 47 y.o.   MRN: [id number]  Medication Refill     03/31/17  He is here today for follow-up. Hemoglobin A1c in July was 6.9. Patient was started on metformin 500 mg by mouth twice a day and therapeutic lifestyle changes recommended including diet and weight loss. However hemoglobin A1c on the 18th was 13 indicating rapid progression to insulin-dependent diabetes.  He is currently on Levemir 25 units daily and is using rapid acting insulin 3-4 times a day based on a sliding scale. His blood sugars have been averaging between 250 and 300 despite being on 25 units of Levemir. Therefore he is taking an additional 18 units of rapid acting insulin as a corrective factor throughout the day. Overall he feels much better. He denies any further blurry vision, polyuria, polydipsia. He continues to have some fatigue. His muscle cramps are improved dramatically. He denies any chest pain shortness of breath or dyspnea on exertion.  At that time, my plan was: Increase Levemir to 40 units once daily. Continue Humalog every before meals and at bedtime based on sliding scale. Hopefully by increasing his basal insulin, the amount of Humalog he requires to be gradually diminished so that we can get him on a once daily insulin shot for convenience. May consider adding trulicity if he continues to  have postprandial hyperglycemia despite good fasting blood sugars but we will reassess this in one week.  04/10/17 Late last week levemir was increased to 60 units once daily as his sugars were averaging 200-250 and he was requiring 18 units of rapid insulin per day in addition to 40 untis of levemir.  Since switching his insulin dose, his fasting blood sugars have been just above 120 and his two-hour postprandial sugars are under 200. He would like to monitor them for an additional week prior to making any further changes. I believe it's reasonable. I saw the patient  originally in July for pleurisy after he had bronchitis. I suspected a rib injury. Patient symptoms had completely subsided. Yesterday he was coughing hard and the pain returned in the exact same spot. Pain is located in the axillary line on the right side over his night for 10th rib area. He is mildly tender to palpation in that area. He reports pleurisy. He denies any hemoptysis or shortness of breath or dyspnea on exertion.  At that time, my plan was: blood sugars almost sound controlled. Recheck in one week. At that point, if he is continuing to have episodes of hyperglycemia, rather than increase his insulin, I would add trulicity and try to remove fast acting insulin. He would therefore then be on basal levemir and trulicity.  I believe he has reinjured his ribs. I do not believe there is a fracture. Instead I believe is an intercostal strain. I recommended 2 weeks of no heavy lifting and I suspect that the pain will subside gradually on its own during that timeframe  11/17/17 Patient is here today for follow-up of his diabetes.  He is currently on Levemir 60 units every morning.  His fasting sugars in the morning up until 3 weeks ago have been well controlled between 101 130.  3 weeks ago, his sugars have risen from 1 20-1 60.  He is not checking sugars later in the day or postprandial sugars.  He is not taking fast acting insulin with meals.  He denies any polyuria, polydipsia, or blurry vision.  He is compliant with his fenofibrate and his metformin.  He is gained 20 pounds roughly since his last office visit.  He denies any chest pain shortness of breath or dyspnea on exertion.  Unfortunately today his blood pressure is extremely high 160/84.  Check hemoglobin A1c.  If greater than 7.5, I would recommending adding Trulicity to better manage his postprandial sugars.  However as I explained to the patient it is possible that his sugars may be extremely low later in the day causing him to feel weak and  tired.  Since he is not checking his sugars, we are uncertain of what is happening later in the day.  If his hemoglobin A1c is less than 6.5, we likely will be able to discontinue insulin and replace it with Trulicity.  Therefore I will await the results of the hemoglobin A1c.  I will also check a fasting lipid panel.  Patient should be on a statin given his diabetes.  If his triglycerides are well controlled, I would like to discontinue fenofibrate and replaced with statin therapy.  His blood pressure today is extremely high.  Therefore I will add losartan 50 mg p.o. daily and recheck blood pressure in 1 month.  12/22/17 HgA1c was found to be 6.7.  I gave the patient the following option:  "A1c is outstanding at 6.7. I would leave insulin alone and add nothing to it. I would stop fenofibrate and replace with lipitor 40 mg poqday and recheck labs in 3 months. If he wants to try trulicity I would reduce levemir to 25 units and add trulicity 2.11 mg weekly and recheck sugars in 4 weeks at ov (fbs and 2 hr pps)." Patient elected to decrease his Levemir to 25 units a day and take Trulicity 9.41 mg a day.  Despite reducing his insulin significantly, his fasting blood sugars have averaged between 100-130 and his 2-hour postprandial sugars are less than 180 per his report.  He denies any significant hypoglycemia.  He is tolerating the medication well with no nausea or vomiting.  Unfortunately he has not seen any weight loss but he readily admits that he has not changed his diet at all Past Medical History:  Diagnosis Date  . Diabetes mellitus type 2 in obese ([place])   . Hypertriglyceridemia    Past Surgical History:  Procedure Laterality Date  . REPLACEMENT TOTAL KNEE     Current Outpatient Medications on File Prior to Visit  Medication Sig Dispense Refill  . albuterol (PROVENTIL HFA;VENTOLIN HFA) 108 (90 Base) MCG/ACT inhaler Inhale 1-2 puffs into the lungs every 6 (six) hours as needed for wheezing or  shortness of breath.    Marland Kitchen atorvastatin (LIPITOR) 40 MG tablet Take 1 tablet (40 mg total) by mouth daily. 90 tablet 3  . blood glucose meter kit and supplies KIT Dispense based on patient and insurance preference. Use up to four times daily as directed. (FOR ICD-9 250.00, 250.01). 1 each 0  . Dulaglutide (TRULICITY) 7.40 CX/4.4YJ SOPN Inject 0.75 mg into the skin once a week. 2 mL 1  . fenofibrate 160 MG tablet Take 1 tablet (160 mg total) by mouth daily. 90 tablet 3  . insulin lispro (HUMALOG) 100 UNIT/ML KwikPen Junior Inject 0-0.15 mLs (0-15 Units total) into the skin 3 (three) times daily. 3 pen 11  . Insulin Pen Needle 31G X 5 MM MISC To test TID 100 each 11  . LEVEMIR FLEXTOUCH 100 UNIT/ML Pen INJECT 60 UNITS INTO THE SKIN DAILY AT 10  PM. 15 pen 3  . losartan (COZAAR) 50 MG tablet Take 1 tablet (50 mg total) by mouth daily. 90 tablet 3  . metFORMIN (GLUCOPHAGE) 500 MG tablet Take 1 tablet (500 mg total) by mouth 2 (two) times daily with a meal. 180 tablet 3  . omega-3 acid ethyl esters (LOVAZA) 1 g capsule TAKE 2 CAPSULES (2 G TOTAL) BY MOUTH 2 (TWO) TIMES DAILY. 180 capsule 3  . Omega-3 Fatty Acids (FISH OIL) 1000 MG CPDR Take 2,000 mg by mouth daily.     No current facility-administered medications on file prior to visit.    No Known Allergies Social History   Socioeconomic History  . Marital status: Married    Spouse name: Not on file  . Number of children: Not on file  . Years of education: Not on file  . Highest education level: Not on file  Occupational History  . Not on file  Social Needs  . Financial resource strain: Not on file  . Food insecurity:    Worry: Not on file    Inability: Not on file  . Transportation needs:    Medical: Not on file    Non-medical: Not on file  Tobacco Use  . Smoking status: Former Smoker    Packs/day: 1.50    Types: Cigarettes  . Smokeless tobacco: Never Used  Substance and Sexual Activity  . Alcohol use: No    Alcohol/week: 0.0 oz     Comment: Very occasional  . Drug use: No    Comment: quit one year ago  . Sexual activity: Not on file  Lifestyle  . Physical activity:    Days per week: Not on file    Minutes per session: Not on file  . Stress: Not on file  Relationships  . Social connections:    Talks on phone: Not on file    Gets together: Not on file    Attends religious service: Not on file    Active member of club or organization: Not on file    Attends meetings of clubs or organizations: Not on file    Relationship status: Not on file  . Intimate partner violence:    Fear of current or ex partner: Not on file    Emotionally abused: Not on file    Physically abused: Not on file    Forced sexual activity: Not on file  Other Topics Concern  . Not on file  Social History Narrative  . Not on file     Review of Systems  All other systems reviewed and are negative.      Objective:   Physical Exam  Constitutional: He appears well-developed and well-nourished. No distress.  Cardiovascular: Normal rate, regular rhythm and normal heart sounds.  No murmur heard. Pulmonary/Chest: Effort normal. No respiratory distress. He has no wheezes. He has no rales. He exhibits no tenderness.  Abdominal: Soft. Bowel sounds are normal. He exhibits no distension. There is no tenderness. There is no rebound and no guarding.  Musculoskeletal: He exhibits no edema.  Skin: He is not diaphoretic.  Vitals reviewed.      Assessment:  Type 2 diabetes mellitus with complication, with long-term current use of insulin (HCC)    Plan:    Increase Trulicity 1.5 mg a day.  Continue to check fasting blood sugars daily.  Fasting blood sugars fall below 100, decrease Levemir by 5 units.  Continue to decrease Levemir by 5 units until fasting blood sugars are greater than 100  but less than 130.  Recheck fasting labs in 3 months after making this change.

## 2018-01-14 ENCOUNTER — Other Ambulatory Visit: Payer: Self-pay | Admitting: Family Medicine

## 2018-01-30 ENCOUNTER — Other Ambulatory Visit: Payer: Self-pay | Admitting: Family Medicine

## 2018-02-15 ENCOUNTER — Other Ambulatory Visit: Payer: Self-pay

## 2018-02-15 ENCOUNTER — Encounter: Payer: Self-pay | Admitting: Family Medicine

## 2018-02-15 ENCOUNTER — Ambulatory Visit: Payer: BLUE CROSS/BLUE SHIELD | Admitting: Family Medicine

## 2018-02-15 VITALS — BP 128/86 | HR 70 | Temp 98.7°F | Resp 14 | Ht 70.0 in | Wt 294.0 lb

## 2018-02-15 DIAGNOSIS — H1031 Unspecified acute conjunctivitis, right eye: Secondary | ICD-10-CM | POA: Diagnosis not present

## 2018-02-15 MED ORDER — POLYMYXIN B-TRIMETHOPRIM 10000-0.1 UNIT/ML-% OP SOLN: 1.0000 [drp] | OPHTHALMIC | 0 refills | Status: AC

## 2018-02-15 MED ORDER — KETOROLAC TROMETHAMINE 0.5 % OP SOLN: 1.0000 [drp] | Freq: Four times a day (QID) | OPHTHALMIC | 0 refills | Status: DC | PRN

## 2018-02-15 NOTE — Progress Notes (Signed)
Patient ID: Cody Curry, male    DOB: [birth year], 47 y.o.   MRN: [id number]  PCP: Susy Frizzle, MD  Chief Complaint  Patient presents with  . Eye Issues    x3 days- redness, irritation and clear drainage from R eye- some irritation to L eye- thinks he may have something in eye- photophobia    Subjective:   Cody Curry is a 47 y.o. male, presents to clinic with CC of right eye pain, sensation of FB and cleary watery drainage for 4-5 days.  Eye Pain   The right eye is affected. This is a new problem. The current episode started in the past 7 days. The problem occurs constantly. The problem has been gradually worsening. The injury mechanism is unknown. The pain is at a severity of 2/10. There is no known exposure to pink eye. He does not wear contacts. Associated symptoms include an eye discharge, eye redness, a foreign body sensation, photophobia and a recent URI. Pertinent negatives include no blurred vision, double vision, fever, itching, nausea or vomiting. He has tried commercial eye wash, eye drops and water for the symptoms. The treatment provided no relief.  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  He rates the discomfort as 2 out of 10 it does feel like irritation or something in his side but not specifically pain.  It is worse with eye movement, palpation and with exposure to light.  He denies any blurry vision.  He has fairly persistent tearing of clear fluid in his right eye and denies any purulent drainage, does not have any morning crusting.  There is mild upper and lower eyelid swelling without erythema.   Does not wear contacts, does not use prescription lenses.  He has had some redness begin to develop in the lateral side of his left eye but he has no pain or foreign body sensation to his left eye and no drainage.  He endorses some mild nasal symptoms  that began gradually in the last several days but denies any sore throat, headache, fever.    Patient Active Problem List   Diagnosis Date Noted  . Hyperglycemia 03/28/2017  . Diarrhea 03/28/2017  . DKA, type 2 ([place]) 03/28/2017  . Uncontrolled type 2 diabetes mellitus with hyperglycemia, without long-term current use of insulin ([place]) 03/28/2017  . Diabetic ketoacidosis without coma associated with type 2 diabetes mellitus ([place])      Prior to Admission medications   Medication Sig Start Date End Date Taking? Authorizing Provider  atorvastatin (LIPITOR) 40 MG tablet Take 1 tablet (40 mg total) by mouth daily. 11/22/17  Yes Susy Frizzle, MD  Dulaglutide (TRULICITY) 1.5 RC/7.8LF SOPN Inject 1.5 mg into the skin once a week. 12/22/17  Yes Susy Frizzle, MD  insulin lispro (HUMALOG) 100 UNIT/ML KwikPen Junior Inject 0-0.15 mLs (0-15 Units total) into the skin 3 (three) times daily. 03/29/17  Yes Isaac Bliss, Rayford Halsted, MD  Insulin Pen Needle 31G X 5 MM MISC To test TID 03/29/17  Yes Isaac Bliss, Rayford Halsted, MD  LEVEMIR FLEXTOUCH 100 UNIT/ML Pen INJECT 60 UNITS INTO THE SKIN DAILY AT 10 PM. 12/08/17  Yes Susy Frizzle, MD  losartan (COZAAR) 50 MG tablet Take 1 tablet (50 mg total) by mouth daily. 11/17/17  Yes Susy Frizzle, MD  metFORMIN (GLUCOPHAGE) 500 MG tablet TAKE 1 TABLET  BY MOUTH TWICE A DAY WITH A MEAL 01/30/18  Yes Susy Frizzle, MD  omega-3 acid ethyl esters (LOVAZA) 1 g capsule TAKE 2 CAPSULES (2 G TOTAL) BY MOUTH 2 (TWO) TIMES DAILY. 08/22/17  Yes Susy Frizzle, MD  ketorolac (ACULAR) 0.5 % ophthalmic solution Place 1 drop into the right eye 4 (four) times daily as needed (for eye irritation or pain). 02/15/18   Delsa Grana, PA-C  trimethoprim-polymyxin b (POLYTRIM) ophthalmic solution Place 1 drop into the right eye every 4 (four) hours for 7 days. 02/15/18 02/22/18  Delsa Grana, PA-C     No Known Allergies   Family History  Problem Relation Age of Onset    . COPD Mother   . Stroke Mother   . Hyperlipidemia Father   . Hypertension Father   . Cancer Father        bladder  . Heart disease Father   . COPD Father      Social History   Socioeconomic History  . Marital status: Married    Spouse name: Not on file  . Number of children: Not on file  . Years of education: Not on file  . Highest education level: Not on file  Occupational History  . Not on file  Social Needs  . Financial resource strain: Not on file  . Food insecurity:    Worry: Not on file    Inability: Not on file  . Transportation needs:    Medical: Not on file    Non-medical: Not on file  Tobacco Use  . Smoking status: Former Smoker    Packs/day: 1.50    Types: Cigarettes  . Smokeless tobacco: Never Used  Substance and Sexual Activity  . Alcohol use: No    Alcohol/week: 0.0 standard drinks    Comment: Very occasional  . Drug use: No    Comment: quit one year ago  . Sexual activity: Not on file  Lifestyle  . Physical activity:    Days per week: Not on file    Minutes per session: Not on file  . Stress: Not on file  Relationships  . Social connections:    Talks on phone: Not on file    Gets together: Not on file    Attends religious service: Not on file    Active member of club or organization: Not on file    Attends meetings of clubs or organizations: Not on file    Relationship status: Not on file  . Intimate partner violence:    Fear of current or ex partner: Not on file    Emotionally abused: Not on file    Physically abused: Not on file    Forced sexual activity: Not on file  Other Topics Concern  . Not on file  Social History Narrative  . Not on file     Review of Systems  Constitutional: Negative.  Negative for activity change, appetite change and fever.  HENT: Negative.   Eyes: Positive for photophobia, pain, discharge and redness. Negative for blurred vision, double vision and itching.  Respiratory: Negative.   Cardiovascular:  Negative.   Gastrointestinal: Negative.  Negative for nausea and vomiting.  Endocrine: Negative.   Genitourinary: Negative.   Musculoskeletal: Negative.   Skin: Negative.   Allergic/Immunologic: Negative for environmental allergies.  Neurological: Negative.  Negative for dizziness and headaches.  Hematological: Negative.   Psychiatric/Behavioral: Negative.   All other systems reviewed and are negative.      Objective:  Vitals:   02/15/18 0944  BP: 128/86  Pulse: 70  Resp: 14  Temp: 98.7 F (37.1 C)  TempSrc: Oral  SpO2: 99%  Weight: 294 lb (133.4 kg)  Height: 5\' 10"  (1.778 m)      Physical Exam  Constitutional: He appears well-developed.  HENT:  Head: Normocephalic and atraumatic.  Nose: Nose normal.  Eyes: Pupils are equal, round, and reactive to light. EOM are normal. Lids are everted and swept, no foreign bodies found. Right eye exhibits chemosis (some areas of chemosis on right side ) and discharge. Right eye exhibits no exudate and no hordeolum. No foreign body present in the right eye. Left eye exhibits no chemosis, no discharge, no exudate and no hordeolum. No foreign body present in the left eye. Right conjunctiva is injected (moderate diffuse injection to right eye). Right conjunctiva has no hemorrhage. Left conjunctiva is injected (mild lateral injection). Left conjunctiva has no hemorrhage. No scleral icterus. Right eye exhibits normal extraocular motion and no nystagmus. Left eye exhibits normal extraocular motion and no nystagmus. Right pupil is round and reactive. Left pupil is round and reactive. Pupils are equal.  Slit lamp exam:      The right eye shows no corneal abrasion and no fluorescein uptake.  Mild right eyelid upper and lower swelling w/o erythema, no periorbital edema or erythema  Neck: No tracheal deviation present.  Cardiovascular: Normal rate and regular rhythm.  Pulmonary/Chest: Effort normal. No stridor. No respiratory distress.    Musculoskeletal: Normal range of motion.  Neurological: He is alert. He exhibits normal muscle tone. Coordination normal.  Skin: Skin is warm and dry. No rash noted.  Psychiatric: He has a normal mood and affect. His behavior is normal.  Nursing note and vitals reviewed.   Visual Acuity: Vision Screening  Edited by: Delsa Grana, PA-C   Right eye Left eye Both eyes  Without correction 20/25 20/25 20/20      Performed by Margreta Journey Six, LPN       Assessment & Plan:      ICD-10-CM   1. Acute conjunctivitis of right eye, unspecified acute conjunctivitis type H10.31 trimethoprim-polymyxin b (POLYTRIM) ophthalmic solution    ketorolac (ACULAR) 0.5 % ophthalmic solution    Right conjunctivitis, some development of conjunctival injection on left lateral eye.  He has some associated lid edema, tearing (clear drainage) and photophobia.  Floor seen eye exam was negative for abrasion, visual acuity was reassuring, there was no ciliary flare/injection, but rather  palpebral and bulbar conjunctival injection diffusely, reassuring for conjunctivitis.  No red flags.   Given lid edema and severity of symptoms will cover with antibiotic drops however I have advised the patient that if he develops symptoms similar in the left eye to discontinue it is likely a viral illness which is self-limiting.  He can use the ketorolac drops in affected eyes for pain and he did have some minor relief with over-the-counter red eyedrops he was encouraged to continue to use those.  Cold compresses and good hand hygiene also instructed to the patient. Reviewed red flags and emergent conditions which she should go to the ER for.  Follow-up with [place] as needed if symptoms are not improving in a week.     Delsa Grana, PA-C 02/15/18 10:08 AM

## 2018-02-15 NOTE — Patient Instructions (Signed)
Apply antibiotic drops to right eye as directed.  If you develop symptoms in the left eye it is unlikely to be a bacterial infection and more likely to be a virus, and you can stop the antibiotic drops and that point and do supportive treatments as described below in viral conjunctivitis hand out.  You can use the ketorolac drops in affected eyes as needed for irritation.    Be seen immediately if you develop any redness, swelling and pain around your eyes, or painful or non-painful loss of vision - ER is appropriate to evaluate this.  Urgent care and our clinic do not have all diagnostic tools or tests to further evaluate.    If your symptoms do not improve but do not worsen, please notify us, we would like to get you rechecked here or referred to an eye doctor to evaluate.  Meds ordered this encounter  Medications  . trimethoprim-polymyxin b (POLYTRIM) ophthalmic solution    Sig: Place 1 drop into the right eye every 4 (four) hours for 7 days.    Dispense:  10 mL    Refill:  0            . ketorolac (ACULAR) 0.5 % ophthalmic solution    Sig: Place 1 drop into the right eye 4 (four) times daily as needed (for eye irritation or pain).    Dispense:  10 mL    Refill:  0              Viral Conjunctivitis, Adult Viral conjunctivitis is an inflammation of the clear membrane that covers the white part of your eye and the inner surface of your eyelid (conjunctiva). The inflammation is caused by a viral infection. The blood vessels in the conjunctiva become inflamed, causing the eye to become red or pink, and often itchy. Viral conjunctivitis can be easily passed from one person to another (is contagious). This condition is often called pink eye. What are the causes? This condition is caused by a virus. A virus is a type of contagious germ. It can be spread by touching objects that have been contaminated with the virus, such as doorknobs or towels. It can also be passed through droplets, such as  from coughing or sneezing. What are the signs or symptoms? Symptoms of this condition include:  Eye redness.  Tearing or watery eyes.  Itchy and irritated eyes.  Burning feeling in the eyes.  Clear drainage from the eye.  Swollen eyelids.  A gritty feeling in the eye.  Light sensitivity.  This condition often occurs with other symptoms, such as a fever, nausea, or a rash. How is this diagnosed? This condition is diagnosed with a medical history and physical exam. If you have discharge from your eye, the discharge may be tested to rule out other causes of conjunctivitis. How is this treated? Viral conjunctivitis does not respond to medicines that kill bacteria (antibiotics). Treatment for viral conjunctivitis is directed at stopping a bacterial infection from developing in addition to the viral infection. Treatment also aims to relieve your symptoms, such as itching. This may be done with antihistamine drops or other eye medicines. Rarely, steroid eye drops or antiviral medicines may be prescribed. Follow these instructions at home: Medicines   Take or apply over-the-counter and prescription medicines only as told by your health care provider.  Be very careful to avoid touching the edge of the eyelid with the eye drop bottle or ointment tube when applying medicines to the affected eye.  Being careful this way will stop you from spreading the infection to the other eye or to other people. Eye care  Avoid touching or rubbing your eyes.  Apply a warm, wet, clean washcloth to your eye for 10-20 minutes, 3-4 times per day or as told by your health care provider.  If you wear contact lenses, do not wear them until the inflammation is gone and your health care provider says it is safe to wear them again. Ask your health care provider how to sterilize or replace your contact lenses before using them again. Wear glasses until you can resume wearing contacts.  Avoid wearing eye makeup  until the inflammation is gone. Throw away any old eye cosmetics that may be contaminated.  Gently wipe away any drainage from your eye with a warm, wet washcloth or a cotton ball. General instructions  Change or wash your pillowcase every day or as told by your health care provider.  Do not share towels, pillowcases, washcloths, eye makeup, makeup brushes, contact lenses, or glasses. This may spread the infection.  Wash your hands often with soap and water. Use paper towels to dry your hands. If soap and water are not available, use hand sanitizer.  Try to avoid contact with other people for one week or as told by your health care provider. Contact a health care provider if:  Your symptoms do not improve with treatment or they get worse.  You have increased pain.  Your vision becomes blurry.  You have a fever.  You have facial pain, redness, or swelling.  You have yellow or green drainage coming from your eye.  You have new symptoms. This information is not intended to replace advice given to you by your health care provider. Make sure you discuss any questions you have with your health care provider. Document Released: 09/17/2002 Document Revised: 01/23/2016 Document Reviewed: 01/12/2016 Elsevier Interactive Patient Education  [employer].

## 2018-02-16 ENCOUNTER — Ambulatory Visit: Payer: BLUE CROSS/BLUE SHIELD | Admitting: Family Medicine

## 2018-02-27 ENCOUNTER — Other Ambulatory Visit: Payer: Self-pay | Admitting: Family Medicine

## 2018-04-26 ENCOUNTER — Ambulatory Visit: Payer: BLUE CROSS/BLUE SHIELD | Admitting: Family Medicine

## 2018-04-26 ENCOUNTER — Encounter: Payer: Self-pay | Admitting: Family Medicine

## 2018-04-26 VITALS — BP 120/78 | HR 80 | Temp 98.2°F | Resp 18 | Ht 70.0 in | Wt 282.0 lb

## 2018-04-26 DIAGNOSIS — E118 Type 2 diabetes mellitus with unspecified complications: Secondary | ICD-10-CM

## 2018-04-26 DIAGNOSIS — Z794 Long term (current) use of insulin: Secondary | ICD-10-CM

## 2018-04-26 NOTE — Progress Notes (Signed)
Subjective:     Patient ID: Cody Curry, male   DOB: [birth year], 47 y.o.   MRN: [id number]  Medication Refill     03/31/17  He is here today for follow-up. Hemoglobin A1c in July was 6.9. Patient was started on metformin 500 mg by mouth twice a day and therapeutic lifestyle changes recommended including diet and weight loss. However hemoglobin A1c on the 18th was 13 indicating rapid progression to insulin-dependent diabetes.  He is currently on Levemir 25 units daily and is using rapid acting insulin 3-4 times a day based on a sliding scale. His blood sugars have been averaging between 250 and 300 despite being on 25 units of Levemir. Therefore he is taking an additional 18 units of rapid acting insulin as a corrective factor throughout the day. Overall he feels much better. He denies any further blurry vision, polyuria, polydipsia. He continues to have some fatigue. His muscle cramps are improved dramatically. He denies any chest pain shortness of breath or dyspnea on exertion.  At that time, my plan was: Increase Levemir to 40 units once daily. Continue Humalog every before meals and at bedtime based on sliding scale. Hopefully by increasing his basal insulin, the amount of Humalog he requires to be gradually diminished so that we can get him on a once daily insulin shot for convenience. May consider adding trulicity if he continues to  have postprandial hyperglycemia despite good fasting blood sugars but we will reassess this in one week.  04/10/17 Late last week levemir was increased to 60 units once daily as his sugars were averaging 200-250 and he was requiring 18 units of rapid insulin per day in addition to 40 untis of levemir.  Since switching his insulin dose, his fasting blood sugars have been just above 120 and his two-hour postprandial sugars are under 200. He would like to monitor them for an additional week prior to making any further changes. I believe it's reasonable. I saw the patient  originally in July for pleurisy after he had bronchitis. I suspected a rib injury. Patient symptoms had completely subsided. Yesterday he was coughing hard and the pain returned in the exact same spot. Pain is located in the axillary line on the right side over his night for 10th rib area. He is mildly tender to palpation in that area. He reports pleurisy. He denies any hemoptysis or shortness of breath or dyspnea on exertion.  At that time, my plan was: blood sugars almost sound controlled. Recheck in one week. At that point, if he is continuing to have episodes of hyperglycemia, rather than increase his insulin, I would add trulicity and try to remove fast acting insulin. He would therefore then be on basal levemir and trulicity.  I believe he has reinjured his ribs. I do not believe there is a fracture. Instead I believe is an intercostal strain. I recommended 2 weeks of no heavy lifting and I suspect that the pain will subside gradually on its own during that timeframe  11/17/17 Patient is here today for follow-up of his diabetes.  He is currently on Levemir 60 units every morning.  His fasting sugars in the morning up until 3 weeks ago have been well controlled between 101 130.  3 weeks ago, his sugars have risen from 1 20-1 60.  He is not checking sugars later in the day or postprandial sugars.  He is not taking fast acting insulin with meals.  He denies any polyuria, polydipsia, or blurry vision.  He is compliant with his fenofibrate and his metformin.  He is gained 20 pounds roughly since his last office visit.  He denies any chest pain shortness of breath or dyspnea on exertion.  Unfortunately today his blood pressure is extremely high 160/84.  Check hemoglobin A1c.  If greater than 7.5, I would recommending adding Trulicity to better manage his postprandial sugars.  However as I explained to the patient it is possible that his sugars may be extremely low later in the day causing him to feel weak and  tired.  Since he is not checking his sugars, we are uncertain of what is happening later in the day.  If his hemoglobin A1c is less than 6.5, we likely will be able to discontinue insulin and replace it with Trulicity.  Therefore I will await the results of the hemoglobin A1c.  I will also check a fasting lipid panel.  Patient should be on a statin given his diabetes.  If his triglycerides are well controlled, I would like to discontinue fenofibrate and replaced with statin therapy.  His blood pressure today is extremely high.  Therefore I will add losartan 50 mg p.o. daily and recheck blood pressure in 1 month.  12/22/17 HgA1c was found to be 6.7.  I gave the patient the following option:  "A1c is outstanding at 6.7. I would leave insulin alone and add nothing to it. I would stop fenofibrate and replace with lipitor 40 mg poqday and recheck labs in 3 months. If he wants to try trulicity I would reduce levemir to 25 units and add trulicity 4.58 mg weekly and recheck sugars in 4 weeks at ov (fbs and 2 hr pps)." Patient elected to decrease his Levemir to 25 units a day and take Trulicity 0.99 mg a day.  Despite reducing his insulin significantly, his fasting blood sugars have averaged between 100-130 and his 2-hour postprandial sugars are less than 180 per his report.  He denies any significant hypoglycemia.  He is tolerating the medication well with no nausea or vomiting.  Unfortunately he has not seen any weight loss but he readily admits that he has not changed his diet at all.  At that time, my plan was: Increase Trulicity 1.5 mg a day.  Continue to check fasting blood sugars daily.  Fasting blood sugars fall below 100, decrease Levemir by 5 units.  Continue to decrease Levemir by 5 units until fasting blood sugars are greater than 100 but less than 130.  Recheck fasting labs in 3 months after making this change.   04/26/18 Patient is here today for a follow-up of his diabetes.  He is currently on a  combination of Trulicity, Levemir 20 units a day, and metformin.  He has not been taking the metformin due to hypoglycemic episodes he was experiencing.  Off the metformin or taking it only once a day, his fasting blood sugars have been between 101 120.  He seldom has to use his Humalog to correct his sugars.  He denies any polyuria, polydipsia, or blurry vision.  He denies any chest pain or shortness of breath or dyspnea on exertion.  He is not fasting today and therefore I cannot check his fasting lipid panel.  His blood pressure is excellent.  He is due to check a urine microalbumin. Past Medical History:  Diagnosis Date  . Diabetes mellitus type 2 in obese ([place])   . Hypertriglyceridemia    Past Surgical History:  Procedure Laterality Date  . REPLACEMENT TOTAL  KNEE     Current Outpatient Medications on File Prior to Visit  Medication Sig Dispense Refill  . atorvastatin (LIPITOR) 40 MG tablet Take 1 tablet (40 mg total) by mouth daily. 90 tablet 3  . Dulaglutide (TRULICITY) 1.5 QQ/5.9DG SOPN Inject 1.5 mg into the skin once a week. 4 pen 11  . insulin lispro (HUMALOG) 100 UNIT/ML KwikPen Junior Inject 0-0.15 mLs (0-15 Units total) into the skin 3 (three) times daily. 3 pen 11  . Insulin Pen Needle 31G X 5 MM MISC To test TID 100 each 11  . LEVEMIR FLEXTOUCH 100 UNIT/ML Pen INJECT 60 UNITS INTO THE SKIN DAILY AT 10 PM. 15 pen 3  . losartan (COZAAR) 50 MG tablet Take 1 tablet (50 mg total) by mouth daily. 90 tablet 3  . metFORMIN (GLUCOPHAGE) 500 MG tablet TAKE 1 TABLET BY MOUTH TWICE A DAY WITH A MEAL 60 tablet 11  . omega-3 acid ethyl esters (LOVAZA) 1 g capsule TAKE 2 CAPSULES BY MOUTH 2 (TWO) TIMES DAILY. 120 capsule 5   No current facility-administered medications on file prior to visit.    No Known Allergies Social History   Socioeconomic History  . Marital status: Married    Spouse name: Not on file  . Number of children: Not on file  . Years of education: Not on file  . Highest  education level: Not on file  Occupational History  . Not on file  Social Needs  . Financial resource strain: Not on file  . Food insecurity:    Worry: Not on file    Inability: Not on file  . Transportation needs:    Medical: Not on file    Non-medical: Not on file  Tobacco Use  . Smoking status: Former Smoker    Packs/day: 1.50    Types: Cigarettes  . Smokeless tobacco: Never Used  Substance and Sexual Activity  . Alcohol use: No    Alcohol/week: 0.0 standard drinks    Comment: Very occasional  . Drug use: No    Comment: quit one year ago  . Sexual activity: Not on file  Lifestyle  . Physical activity:    Days per week: Not on file    Minutes per session: Not on file  . Stress: Not on file  Relationships  . Social connections:    Talks on phone: Not on file    Gets together: Not on file    Attends religious service: Not on file    Active member of club or organization: Not on file    Attends meetings of clubs or organizations: Not on file    Relationship status: Not on file  . Intimate partner violence:    Fear of current or ex partner: Not on file    Emotionally abused: Not on file    Physically abused: Not on file    Forced sexual activity: Not on file  Other Topics Concern  . Not on file  Social History Narrative  . Not on file     Review of Systems  All other systems reviewed and are negative.      Objective:   Physical Exam  Constitutional: He appears well-developed and well-nourished. No distress.  Cardiovascular: Normal rate, regular rhythm and normal heart sounds.  No murmur heard. Pulmonary/Chest: Effort normal. No respiratory distress. He has no wheezes. He has no rales. He exhibits no tenderness.  Abdominal: Soft. Bowel sounds are normal. He exhibits no distension. There is no tenderness. There is  no rebound and no guarding.  Musculoskeletal: He exhibits no edema.  Skin: He is not diaphoretic.  Vitals reviewed.      Assessment:  Type 2  diabetes mellitus with complication, with long-term current use of insulin (HCC) - Plan: Hemoglobin A1c, CBC with Differential/Platelet, COMPLETE METABOLIC PANEL WITH GFR, Microalbumin, urine    Plan:   Check hemoglobin A1c.  If hemoglobin A1c is less than 7, I would recommend discontinuing Levemir and then checking fasting blood sugars and 2-hour postprandial sugars in 1 week.  If not substantially elevated, he may no longer require insulin and can be managed with Trulicity and metformin alone.  If there is an increase, I believe we could successfully replace his insulin with either farxiga or Actos depending upon the patient's discretion

## 2018-04-27 LAB — HEMOGLOBIN A1C
Hgb A1c MFr Bld: 7.2 % of total Hgb — ABNORMAL HIGH (ref <5.7)
Mean Plasma Glucose: 160 (calc)
eAG (mmol/L): 8.9 (calc)

## 2018-04-27 LAB — CBC WITH DIFFERENTIAL/PLATELET
Basophils Absolute: 59 cells/uL (ref 0–200)
Basophils Relative: 0.5 %
Eosinophils Absolute: 176 cells/uL (ref 15–500)
Eosinophils Relative: 1.5 %
HCT: 44.5 % (ref 38.5–50.0)
Hemoglobin: 15.5 g/dL (ref 13.2–17.1)
Lymphs Abs: 4013 cells/uL — ABNORMAL HIGH (ref 850–3900)
MCH: 30.3 pg (ref 27.0–33.0)
MCHC: 34.8 g/dL (ref 32.0–36.0)
MCV: 86.9 fL (ref 80.0–100.0)
MPV: 10.4 fL (ref 7.5–12.5)
Monocytes Relative: 9.3 %
Neutro Abs: 6365 cells/uL (ref 1500–7800)
Neutrophils Relative %: 54.4 %
Platelets: 218 10*3/uL (ref 140–400)
RBC: 5.12 10*6/uL (ref 4.20–5.80)
RDW: 12.5 % (ref 11.0–15.0)
Total Lymphocyte: 34.3 %
WBC mixed population: 1088 cells/uL — ABNORMAL HIGH (ref 200–950)
WBC: 11.7 10*3/uL — ABNORMAL HIGH (ref 3.8–10.8)

## 2018-04-27 LAB — COMPLETE METABOLIC PANEL WITH GFR
AG Ratio: 1.7 (calc) (ref 1.0–2.5)
ALT: 37 U/L (ref 9–46)
AST: 30 U/L (ref 10–40)
Albumin: 4.7 g/dL (ref 3.6–5.1)
Alkaline phosphatase (APISO): 72 U/L (ref 40–115)
BUN: 12 mg/dL (ref 7–25)
CO2: 26 mmol/L (ref 20–32)
Calcium: 9.8 mg/dL (ref 8.6–10.3)
Chloride: 101 mmol/L (ref 98–110)
Creat: 1.04 mg/dL (ref 0.60–1.35)
GFR, Est African American: 98 mL/min/{1.73_m2} (ref >=60)
GFR, Est Non African American: 85 mL/min/{1.73_m2} (ref >=60)
Globulin: 2.7 g/dL (calc) (ref 1.9–3.7)
Glucose, Bld: 83 mg/dL (ref 65–99)
Potassium: 4.2 mmol/L (ref 3.5–5.3)
Sodium: 138 mmol/L (ref 135–146)
Total Bilirubin: 0.5 mg/dL (ref 0.2–1.2)
Total Protein: 7.4 g/dL (ref 6.1–8.1)

## 2018-04-27 LAB — MICROALBUMIN, URINE: Microalb, Ur: 1.3 mg/dL

## 2018-05-01 ENCOUNTER — Encounter: Payer: Self-pay | Admitting: *Deleted

## 2018-05-01 DIAGNOSIS — E118 Type 2 diabetes mellitus with unspecified complications: Secondary | ICD-10-CM

## 2018-05-01 DIAGNOSIS — Z794 Long term (current) use of insulin: Secondary | ICD-10-CM

## 2018-05-01 MED ORDER — PIOGLITAZONE HCL 30 MG PO TABS: 30.0000 mg | ORAL_TABLET | Freq: Every day | ORAL | 3 refills | Status: DC

## 2018-05-14 ENCOUNTER — Ambulatory Visit (INDEPENDENT_AMBULATORY_CARE_PROVIDER_SITE_OTHER): Payer: BLUE CROSS/BLUE SHIELD | Admitting: Family Medicine

## 2018-05-14 ENCOUNTER — Encounter: Payer: Self-pay | Admitting: Family Medicine

## 2018-05-14 VITALS — BP 126/78 | HR 78 | Temp 97.0°F | Resp 16 | Ht 70.0 in | Wt 280.0 lb

## 2018-05-14 DIAGNOSIS — Z794 Long term (current) use of insulin: Secondary | ICD-10-CM | POA: Diagnosis not present

## 2018-05-14 DIAGNOSIS — E118 Type 2 diabetes mellitus with unspecified complications: Secondary | ICD-10-CM | POA: Diagnosis not present

## 2018-05-14 MED ORDER — DAPAGLIFLOZIN PROPANEDIOL 10 MG PO TABS: 10.0000 mg | ORAL_TABLET | Freq: Every day | ORAL | 3 refills | Status: DC

## 2018-05-14 MED ORDER — SILDENAFIL CITRATE 100 MG PO TABS: 50.0000 mg | ORAL_TABLET | Freq: Every day | ORAL | 11 refills | Status: DC | PRN

## 2018-05-14 NOTE — Progress Notes (Signed)
Subjective:     Patient ID: Cody Curry, male   DOB: [birth year], 47 y.o.   MRN: [id number]  Medication Refill     03/31/17  He is here today for follow-up. Hemoglobin A1c in July was 6.9. Patient was started on metformin 500 mg by mouth twice a day and therapeutic lifestyle changes recommended including diet and weight loss. However hemoglobin A1c on the 18th was 13 indicating rapid progression to insulin-dependent diabetes.  He is currently on Levemir 25 units daily and is using rapid acting insulin 3-4 times a day based on a sliding scale. His blood sugars have been averaging between 250 and 300 despite being on 25 units of Levemir. Therefore he is taking an additional 18 units of rapid acting insulin as a corrective factor throughout the day. Overall he feels much better. He denies any further blurry vision, polyuria, polydipsia. He continues to have some fatigue. His muscle cramps are improved dramatically. He denies any chest pain shortness of breath or dyspnea on exertion.  At that time, my plan was: Increase Levemir to 40 units once daily. Continue Humalog every before meals and at bedtime based on sliding scale. Hopefully by increasing his basal insulin, the amount of Humalog he requires to be gradually diminished so that we can get him on a once daily insulin shot for convenience. May consider adding trulicity if he continues to  have postprandial hyperglycemia despite good fasting blood sugars but we will reassess this in one week.  04/10/17 Late last week levemir was increased to 60 units once daily as his sugars were averaging 200-250 and he was requiring 18 units of rapid insulin per day in addition to 40 untis of levemir.  Since switching his insulin dose, his fasting blood sugars have been just above 120 and his two-hour postprandial sugars are under 200. He would like to monitor them for an additional week prior to making any further changes. I believe it's reasonable. I saw the patient  originally in July for pleurisy after he had bronchitis. I suspected a rib injury. Patient symptoms had completely subsided. Yesterday he was coughing hard and the pain returned in the exact same spot. Pain is located in the axillary line on the right side over his night for 10th rib area. He is mildly tender to palpation in that area. He reports pleurisy. He denies any hemoptysis or shortness of breath or dyspnea on exertion.  At that time, my plan was: blood sugars almost sound controlled. Recheck in one week. At that point, if he is continuing to have episodes of hyperglycemia, rather than increase his insulin, I would add trulicity and try to remove fast acting insulin. He would therefore then be on basal levemir and trulicity.  I believe he has reinjured his ribs. I do not believe there is a fracture. Instead I believe is an intercostal strain. I recommended 2 weeks of no heavy lifting and I suspect that the pain will subside gradually on its own during that timeframe  11/17/17 Patient is here today for follow-up of his diabetes.  He is currently on Levemir 60 units every morning.  His fasting sugars in the morning up until 3 weeks ago have been well controlled between 101 130.  3 weeks ago, his sugars have risen from 1 20-1 60.  He is not checking sugars later in the day or postprandial sugars.  He is not taking fast acting insulin with meals.  He denies any polyuria, polydipsia, or blurry vision.  He is compliant with his fenofibrate and his metformin.  He is gained 20 pounds roughly since his last office visit.  He denies any chest pain shortness of breath or dyspnea on exertion.  Unfortunately today his blood pressure is extremely high 160/84.  Check hemoglobin A1c.  If greater than 7.5, I would recommending adding Trulicity to better manage his postprandial sugars.  However as I explained to the patient it is possible that his sugars may be extremely low later in the day causing him to feel weak and  tired.  Since he is not checking his sugars, we are uncertain of what is happening later in the day.  If his hemoglobin A1c is less than 6.5, we likely will be able to discontinue insulin and replace it with Trulicity.  Therefore I will await the results of the hemoglobin A1c.  I will also check a fasting lipid panel.  Patient should be on a statin given his diabetes.  If his triglycerides are well controlled, I would like to discontinue fenofibrate and replaced with statin therapy.  His blood pressure today is extremely high.  Therefore I will add losartan 50 mg p.o. daily and recheck blood pressure in 1 month.  12/22/17 HgA1c was found to be 6.7.  I gave the patient the following option:  "A1c is outstanding at 6.7. I would leave insulin alone and add nothing to it. I would stop fenofibrate and replace with lipitor 40 mg poqday and recheck labs in 3 months. If he wants to try trulicity I would reduce levemir to 25 units and add trulicity 8.18 mg weekly and recheck sugars in 4 weeks at ov (fbs and 2 hr pps)." Patient elected to decrease his Levemir to 25 units a day and take Trulicity 2.99 mg a day.  Despite reducing his insulin significantly, his fasting blood sugars have averaged between 100-130 and his 2-hour postprandial sugars are less than 180 per his report.  He denies any significant hypoglycemia.  He is tolerating the medication well with no nausea or vomiting.  Unfortunately he has not seen any weight loss but he readily admits that he has not changed his diet at all.  At that time, my plan was: Increase Trulicity 1.5 mg a day.  Continue to check fasting blood sugars daily.  Fasting blood sugars fall below 100, decrease Levemir by 5 units.  Continue to decrease Levemir by 5 units until fasting blood sugars are greater than 100 but less than 130.  Recheck fasting labs in 3 months after making this change.   04/26/18 Patient is here today for a follow-up of his diabetes.  He is currently on a  combination of Trulicity, Levemir 20 units a day, and metformin.  He has not been taking the metformin due to hypoglycemic episodes he was experiencing.  Off the metformin or taking it only once a day, his fasting blood sugars have been between 101 120.  He seldom has to use his Humalog to correct his sugars.  He denies any polyuria, polydipsia, or blurry vision.  He denies any chest pain or shortness of breath or dyspnea on exertion.  He is not fasting today and therefore I cannot check his fasting lipid panel.  His blood pressure is excellent.  He is due to check a urine microalbumin.  At that time, ,my plan was: Check hemoglobin A1c.  If hemoglobin A1c is less than 7, I would recommend discontinuing Levemir and then checking fasting blood sugars and 2-hour postprandial sugars  in 1 week.  If not substantially elevated, he may no longer require insulin and can be managed with Trulicity and metformin alone.  If there is an increase, I believe we could successfully replace his insulin with either farxiga or Actos depending upon the patient's discretion  05/14/18 Patient's hemoglobin A1c at last office visit was 7.2.  Patient elected to discontinue insulin and replace with Actos 30 mg a day.  However shortly after starting the medication he developed bilateral lower back pain.  He stopped the medication and the back pain went away after 3 to 4 days.  Therefore he attributes the back pain with the medication.  He has not restarted the medication and he has not had a recurrence of the back pain.  However he is also discontinue the insulin and again his hemoglobin A1c was 7.2 while taking insulin and therefore I do not believe he will be able to control his sugars without adding an additional medication to replace the insulin.  He reports erectile dysfunction Past Medical History:  Diagnosis Date  . Diabetes mellitus type 2 in obese ([place])   . Hypertriglyceridemia    Past Surgical History:  Procedure Laterality  Date  . REPLACEMENT TOTAL KNEE     Current Outpatient Medications on File Prior to Visit  Medication Sig Dispense Refill  . atorvastatin (LIPITOR) 40 MG tablet Take 1 tablet (40 mg total) by mouth daily. 90 tablet 3  . Dulaglutide (TRULICITY) 1.5 YQ/6.5HQ SOPN Inject 1.5 mg into the skin once a week. 4 pen 11  . insulin lispro (HUMALOG) 100 UNIT/ML KwikPen Junior Inject 0-0.15 mLs (0-15 Units total) into the skin 3 (three) times daily. 3 pen 11  . Insulin Pen Needle 31G X 5 MM MISC To test TID 100 each 11  . LEVEMIR FLEXTOUCH 100 UNIT/ML Pen INJECT 60 UNITS INTO THE SKIN DAILY AT 10 PM. 15 pen 3  . losartan (COZAAR) 50 MG tablet Take 1 tablet (50 mg total) by mouth daily. 90 tablet 3  . metFORMIN (GLUCOPHAGE) 500 MG tablet TAKE 1 TABLET BY MOUTH TWICE A DAY WITH A MEAL 60 tablet 11  . omega-3 acid ethyl esters (LOVAZA) 1 g capsule TAKE 2 CAPSULES BY MOUTH 2 (TWO) TIMES DAILY. 120 capsule 5  . pioglitazone (ACTOS) 30 MG tablet Take 1 tablet (30 mg total) by mouth daily. 30 tablet 3   No current facility-administered medications on file prior to visit.    No Known Allergies Social History   Socioeconomic History  . Marital status: Married    Spouse name: Not on file  . Number of children: Not on file  . Years of education: Not on file  . Highest education level: Not on file  Occupational History  . Not on file  Social Needs  . Financial resource strain: Not on file  . Food insecurity:    Worry: Not on file    Inability: Not on file  . Transportation needs:    Medical: Not on file    Non-medical: Not on file  Tobacco Use  . Smoking status: Former Smoker    Packs/day: 1.50    Types: Cigarettes  . Smokeless tobacco: Never Used  Substance and Sexual Activity  . Alcohol use: No    Alcohol/week: 0.0 standard drinks    Comment: Very occasional  . Drug use: No    Comment: quit one year ago  . Sexual activity: Not on file  Lifestyle  . Physical activity:    Days  per week: Not  on file    Minutes per session: Not on file  . Stress: Not on file  Relationships  . Social connections:    Talks on phone: Not on file    Gets together: Not on file    Attends religious service: Not on file    Active member of club or organization: Not on file    Attends meetings of clubs or organizations: Not on file    Relationship status: Not on file  . Intimate partner violence:    Fear of current or ex partner: Not on file    Emotionally abused: Not on file    Physically abused: Not on file    Forced sexual activity: Not on file  Other Topics Concern  . Not on file  Social History Narrative  . Not on file     Review of Systems  All other systems reviewed and are negative.      Objective:   Physical Exam  Constitutional: He appears well-developed and well-nourished. No distress.  Cardiovascular: Normal rate, regular rhythm and normal heart sounds.  No murmur heard. Pulmonary/Chest: Effort normal. No respiratory distress. He has no wheezes. He has no rales. He exhibits no tenderness.  Abdominal: Soft. Bowel sounds are normal. He exhibits no distension. There is no tenderness. There is no rebound and no guarding.  Musculoskeletal: He exhibits no edema.  Skin: He is not diaphoretic.  Vitals reviewed.      Assessment:  Type 2 diabetes mellitus with complication, with long-term current use of insulin (HCC)    Plan:   Stop insulin.  Discontinue Actos.  I do not believe Actos was causing his low back pain however the patient does not want to take medication due to weight gain and fluid retention.  Instead we will try replacing it with farxiga 10 mg a day and recheck hemoglobin A1c in 3 months.  He can also use Viagra 50 to 100 mg p.o. daily as needed erectile dysfunction

## 2018-05-15 ENCOUNTER — Telehealth: Payer: Self-pay | Admitting: *Deleted

## 2018-05-15 MED ORDER — DAPAGLIFLOZIN PROPANEDIOL 10 MG PO TABS: 10.0000 mg | ORAL_TABLET | Freq: Every day | ORAL | 3 refills | Status: DC

## 2018-05-15 NOTE — Telephone Encounter (Signed)
Received request from pharmacy for [place] on Farxiga.   PA submitted.   Dx: E11.65- DM with Hyperglycemia.

## 2018-05-15 NOTE — Telephone Encounter (Signed)
Received immediate approval from insurance.   PA approved effective from 05/15/2018 through 05/13/2021.

## 2018-06-21 ENCOUNTER — Encounter: Payer: Self-pay | Admitting: Family Medicine

## 2018-06-21 ENCOUNTER — Ambulatory Visit: Payer: BLUE CROSS/BLUE SHIELD | Admitting: Family Medicine

## 2018-06-21 VITALS — BP 120/80 | HR 75 | Temp 98.5°F | Resp 15 | Ht 70.0 in | Wt 282.2 lb

## 2018-06-21 DIAGNOSIS — Z20818 Contact with and (suspected) exposure to other bacterial communicable diseases: Secondary | ICD-10-CM | POA: Diagnosis not present

## 2018-06-21 DIAGNOSIS — J069 Acute upper respiratory infection, unspecified: Secondary | ICD-10-CM | POA: Diagnosis not present

## 2018-06-21 MED ORDER — AMOXICILLIN 500 MG PO CAPS: 500.0000 mg | ORAL_CAPSULE | Freq: Two times a day (BID) | ORAL | 0 refills | Status: AC

## 2018-06-21 NOTE — Progress Notes (Signed)
Patient ID: Cody Curry, male    DOB: Jun 28, 1971, 47 y.o.   MRN: [id number]  PCP: Susy Frizzle, MD  Chief Complaint  Patient presents with  . Sore Throat    Patient has c/o sore throat, has c/o nasal congestion, cough. Has been exposed to strep. Onset of symptoms 4 days ago.    Subjective:   Cody Curry is a 47 y.o. male, presents to clinic with CC of 4 days of uri sx, nasal discharge and congestion, sore scratchy and burning sore throat, mild intermittent productive cough - sputum is scant and clear.  He has associated body aches and decreased energy.  No fever, wheeze, SOB, night sweats, weight loss, head aches, rash.  Son was dx with strep throat yesterday, he would like to be tested.    Patient Active Problem List   Diagnosis Date Noted  . Hyperglycemia 03/28/2017  . Diarrhea 03/28/2017  . DKA, type 2 ([place]) 03/28/2017  . Uncontrolled type 2 diabetes mellitus with hyperglycemia, without long-term current use of insulin ([place]) 03/28/2017  . Diabetic ketoacidosis without coma associated with type 2 diabetes mellitus ([place])      Prior to Admission medications   Medication Sig Start Date End Date Taking? Authorizing Provider  atorvastatin (LIPITOR) 40 MG tablet Take 1 tablet (40 mg total) by mouth daily. 11/22/17  Yes Susy Frizzle, MD  dapagliflozin propanediol (FARXIGA) 10 MG TABS tablet Take 10 mg by mouth daily. 05/15/18  Yes Susy Frizzle, MD  Dulaglutide (TRULICITY) 1.5 WU/1.3KG SOPN Inject 1.5 mg into the skin once a week. 12/22/17  Yes Susy Frizzle, MD  insulin lispro (HUMALOG) 100 UNIT/ML KwikPen Junior Inject 0-0.15 mLs (0-15 Units total) into the skin 3 (three) times daily. 03/29/17  Yes Isaac Bliss, Rayford Halsted, MD  Insulin Pen Needle 31G X 5 MM MISC To test TID 03/29/17  Yes Isaac Bliss, Rayford Halsted, MD  losartan (COZAAR) 50 MG tablet Take 1 tablet (50 mg total) by mouth daily. 11/17/17  Yes Susy Frizzle, MD  metFORMIN (GLUCOPHAGE) 500 MG  tablet TAKE 1 TABLET BY MOUTH TWICE A DAY WITH A MEAL 01/30/18  Yes Susy Frizzle, MD  omega-3 acid ethyl esters (LOVAZA) 1 g capsule TAKE 2 CAPSULES BY MOUTH 2 (TWO) TIMES DAILY. 02/27/18  Yes Susy Frizzle, MD  sildenafil (VIAGRA) 100 MG tablet Take 0.5-1 tablets (50-100 mg total) by mouth daily as needed for erectile dysfunction. 05/14/18  Yes Susy Frizzle, MD     No Known Allergies   Family History  Problem Relation Age of Onset  . COPD Mother   . Stroke Mother   . Hyperlipidemia Father   . Hypertension Father   . Cancer Father        bladder  . Heart disease Father   . COPD Father      Social History   Socioeconomic History  . Marital status: Married    Spouse name: Not on file  . Number of children: Not on file  . Years of education: Not on file  . Highest education level: Not on file  Occupational History  . Not on file  Social Needs  . Financial resource strain: Not on file  . Food insecurity:    Worry: Not on file    Inability: Not on file  . Transportation needs:    Medical: Not on file    Non-medical: Not on file  Tobacco Use  . Smoking status: Former  Smoker    Packs/day: 1.50    Types: Cigarettes  . Smokeless tobacco: Never Used  Substance and Sexual Activity  . Alcohol use: No    Alcohol/week: 0.0 standard drinks    Comment: Very occasional  . Drug use: No    Comment: quit one year ago  . Sexual activity: Not on file  Lifestyle  . Physical activity:    Days per week: Not on file    Minutes per session: Not on file  . Stress: Not on file  Relationships  . Social connections:    Talks on phone: Not on file    Gets together: Not on file    Attends religious service: Not on file    Active member of club or organization: Not on file    Attends meetings of clubs or organizations: Not on file    Relationship status: Not on file  . Intimate partner violence:    Fear of current or ex partner: Not on file    Emotionally abused: Not on  file    Physically abused: Not on file    Forced sexual activity: Not on file  Other Topics Concern  . Not on file  Social History Narrative  . Not on file     Review of Systems  Constitutional: Negative.   HENT: Negative.   Eyes: Negative.   Respiratory: Negative.   Cardiovascular: Negative.   Gastrointestinal: Negative.   Endocrine: Negative.   Genitourinary: Negative.   Musculoskeletal: Negative.   Skin: Negative.   Allergic/Immunologic: Negative.   Neurological: Negative.   Hematological: Negative.   Psychiatric/Behavioral: Negative.   All other systems reviewed and are negative.      Objective:    Vitals:   06/21/18 0915  BP: 120/80  Pulse: 75  Resp: 15  Temp: 98.5 F (36.9 C)  TempSrc: Oral  SpO2: 97%  Weight: 282 lb 4 oz (128 kg)  Height: 5\' 10"  (1.778 m)      Physical Exam Vitals signs and nursing note reviewed.  Constitutional:      General: He is not in acute distress.    Appearance: Normal appearance. He is well-developed. He is obese. He is not ill-appearing, toxic-appearing or diaphoretic.  HENT:     Head: Normocephalic and atraumatic.     Jaw: No trismus.     Right Ear: Tympanic membrane, ear canal and external ear normal.     Left Ear: Tympanic membrane, ear canal and external ear normal.     Nose: Mucosal edema, congestion and rhinorrhea present.     Right Sinus: No maxillary sinus tenderness or frontal sinus tenderness.     Left Sinus: No maxillary sinus tenderness or frontal sinus tenderness.     Comments: Nasal mucosal edema and erythema with clear discharge, no sinus ttp    Mouth/Throat:     Mouth: Mucous membranes are moist. Mucous membranes are not pale, not dry and not cyanotic. No oral lesions.     Pharynx: Uvula midline. Posterior oropharyngeal erythema present. No pharyngeal swelling, oropharyngeal exudate or uvula swelling.     Tonsils: No tonsillar exudate or tonsillar abscesses. Swelling: 0 on the right. 0 on the left.    Eyes:     General: Lids are normal.        Right eye: No discharge.        Left eye: No discharge.     Conjunctiva/sclera: Conjunctivae normal.     Pupils: Pupils are equal, round, and reactive to light.  Neck:     Musculoskeletal: Normal range of motion and neck supple.     Trachea: Trachea and phonation normal. No tracheal deviation.  Cardiovascular:     Rate and Rhythm: Normal rate and regular rhythm.     Pulses:          Radial pulses are 2+ on the right side and 2+ on the left side.     Heart sounds: Normal heart sounds. No murmur. No friction rub. No gallop.   Pulmonary:     Effort: Pulmonary effort is normal. No tachypnea, accessory muscle usage or respiratory distress.     Breath sounds: Normal breath sounds. No stridor. No decreased breath sounds, wheezing, rhonchi or rales.  Abdominal:     General: Bowel sounds are normal. There is no distension.     Palpations: Abdomen is soft.     Tenderness: There is no abdominal tenderness.  Musculoskeletal: Normal range of motion.  Lymphadenopathy:     Cervical: No cervical adenopathy.  Skin:    General: Skin is warm and dry.     Capillary Refill: Capillary refill takes less than 2 seconds.     Coloration: Skin is not pale.     Findings: No rash.     Nails: There is no clubbing.   Neurological:     Mental Status: He is alert and oriented to person, place, and time.     Motor: No abnormal muscle tone.     Coordination: Coordination normal.     Gait: Gait normal.  Psychiatric:        Speech: Speech normal.        Behavior: Behavior normal. Behavior is cooperative.       Rapid strep negative - culture sent    Assessment & Plan:   47 y/o male with pmhx of DM, fairly well controlled, presents with 4 days URI sx and exposure to strep.  Rapid strep negative, exam more consistent with viral URI, no bronchitis sx - no wheeze, SOB.  Supportive and sx tx.  Amoxicillin rx printed for pt to hold pending strep culture.  Follow up  with any worsening.      ICD-10-CM   1. Upper respiratory tract infection, unspecified type J06.9 STREP GROUP A AG, W/REFLEX TO CULT  2. Exposure to strep throat Z20.818        Delsa Grana, PA-C 06/21/18 9:34 AM

## 2018-06-21 NOTE — Addendum Note (Signed)
Addended by: Launa Grill on: 06/21/2018 04:54 PM   Modules accepted: Orders

## 2018-06-23 LAB — CULTURE, GROUP A STREP
MICRO NUMBER:: 91489556
SOURCE:: 0
SPECIMEN QUALITY:: ADEQUATE

## 2018-06-23 LAB — STREP GROUP A AG, W/REFLEX TO CULT: Streptococcus, Group A Screen (Direct): NOT DETECTED

## 2018-07-02 ENCOUNTER — Encounter: Payer: Self-pay | Admitting: Family Medicine

## 2018-10-04 ENCOUNTER — Other Ambulatory Visit: Payer: Self-pay

## 2018-10-04 ENCOUNTER — Ambulatory Visit (INDEPENDENT_AMBULATORY_CARE_PROVIDER_SITE_OTHER): Payer: BLUE CROSS/BLUE SHIELD | Admitting: Family Medicine

## 2018-10-04 ENCOUNTER — Encounter: Payer: Self-pay | Admitting: Family Medicine

## 2018-10-04 VITALS — BP 120/84 | HR 79 | Temp 97.5°F | Resp 18 | Ht 70.5 in | Wt 291.2 lb

## 2018-10-04 DIAGNOSIS — E118 Type 2 diabetes mellitus with unspecified complications: Secondary | ICD-10-CM | POA: Diagnosis not present

## 2018-10-04 DIAGNOSIS — I10 Essential (primary) hypertension: Secondary | ICD-10-CM | POA: Diagnosis not present

## 2018-10-04 DIAGNOSIS — E781 Pure hyperglyceridemia: Secondary | ICD-10-CM

## 2018-10-04 DIAGNOSIS — Z6841 Body Mass Index (BMI) 40.0 and over, adult: Secondary | ICD-10-CM | POA: Diagnosis not present

## 2018-10-04 DIAGNOSIS — Z794 Long term (current) use of insulin: Secondary | ICD-10-CM | POA: Diagnosis not present

## 2018-10-04 MED ORDER — ATORVASTATIN CALCIUM 40 MG PO TABS: 40.0000 mg | ORAL_TABLET | Freq: Every day | ORAL | 3 refills | Status: DC

## 2018-10-04 MED ORDER — PIOGLITAZONE HCL 30 MG PO TABS: 30.0000 mg | ORAL_TABLET | Freq: Every day | ORAL | 3 refills | Status: DC

## 2018-10-04 MED ORDER — DULAGLUTIDE 1.5 MG/0.5ML ~~LOC~~ SOAJ: 1.5000 mg | SUBCUTANEOUS | 11 refills | Status: DC

## 2018-10-04 NOTE — Progress Notes (Signed)
Patient ID: Cody Curry, male    DOB: 07-Dec-1970, 48 y.o.   MRN: [id number]  PCP: Susy Frizzle, MD  Chief Complaint  Patient presents with  . Diabetes    Subjective:   Cody Curry is a 48 y.o. male, presents to clinic with CC of DM f/up and due for fasting labs. He is a patient of Susy Frizzle, MD Last visit for DM/HTN/HLD was 05/14/2018:  05/14/18 HPI: Patient's hemoglobin A1c at last office visit was 7.2.  Patient elected to discontinue insulin and replace with Actos 30 mg a day.  However shortly after starting the medication he developed bilateral lower back pain.  He stopped the medication and the back pain went away after 3 to 4 days.  Therefore he attributes the back pain with the medication.  He has not restarted the medication and he has not had a recurrence of the back pain.  However he is also discontinue the insulin and again his hemoglobin A1c was 7.2 while taking insulin and therefore I do not believe he will be able to control his sugars without adding an additional medication to replace the insulin.  He reports erectile dysfunction A&P: Assessment:  Type 2 diabetes mellitus with complication, with long-term current use of insulin (HCC)    Plan:   Stop insulin.  Discontinue Actos.  I do not believe Actos was causing his low back pain however the patient does not want to take medication due to weight gain and fluid retention.  Instead we will try replacing it with farxiga 10 mg a day and recheck hemoglobin A1c in 3 months.  He can also use Viagra 50 to 100 mg p.o. daily as needed erectile dysfunction  Today when I walk into exam room, he is currently on the phone, trying to contact the pharmacy to see if his med refills are available and at what location.  He is also a little confused on which med is for what condition, and he has printed med list in his hand and also has on his phone to review.  Blood sugars usually around 110-120 in am, only had one  high day after eating poorly the day before, brownies, etc, states is was 178.    He is a little confused on DM meds.  Michela Pitcher he is out of trulicity shot, having difficulty getting ahold of pharmacies to see where it is available, he does have several refills left.   actos is more affordable than other med he was switched to - farxiga, wants to go back to actos, previously reported back pain as SE was mechanical in nature. He is taking metformin as prescirbed to SE.  He's gained some weight, having trouble with limited foods/shopping etc.  He is unable to afford lovaza, is taking OTC now. He is taking losartan right now, admits he has missed a week or two recently, not monitoring BP - his cuff at home is not working.  Just started taking again this week.  He is taking atorvastatin - he thinks, but is a little confused on the medications.  He could not afford Lovaza  Patient Active Problem List   Diagnosis Date Noted  . Hyperglycemia 03/28/2017  . Diarrhea 03/28/2017  . DKA, type 2 ([place]) 03/28/2017  . Uncontrolled type 2 diabetes mellitus with hyperglycemia, without long-term current use of insulin ([place]) 03/28/2017  . Diabetic ketoacidosis without coma associated with type 2 diabetes mellitus ([place])     Current Meds  Medication Sig  . dapagliflozin propanediol (FARXIGA) 10 MG TABS tablet Take 10 mg by mouth daily.  . Dulaglutide (TRULICITY) 1.5 RX/5.4MG SOPN Inject 1.5 mg into the skin once a week.  . losartan (COZAAR) 50 MG tablet Take 1 tablet (50 mg total) by mouth daily.  . metFORMIN (GLUCOPHAGE) 500 MG tablet TAKE 1 TABLET BY MOUTH TWICE A DAY WITH A MEAL  . omega-3 acid ethyl esters (LOVAZA) 1 g capsule TAKE 2 CAPSULES BY MOUTH 2 (TWO) TIMES DAILY.  . sildenafil (VIAGRA) 100 MG tablet Take 0.5-1 tablets (50-100 mg total) by mouth daily as needed for erectile dysfunction.     Review of Systems  Constitutional: Negative.   HENT: Negative.   Eyes: Negative.   Respiratory: Negative.    Cardiovascular: Negative.   Gastrointestinal: Negative.   Endocrine: Negative.   Genitourinary: Negative.   Musculoskeletal: Negative.   Skin: Negative.   Allergic/Immunologic: Negative.   Neurological: Negative.   Hematological: Negative.   Psychiatric/Behavioral: Negative.   All other systems reviewed and are negative.      Objective:    Vitals:   10/04/18 1025  BP: 120/84  Pulse: 79  Resp: 18  Temp: (!) 97.5 F (36.4 C)  SpO2: 96%  Weight: 291 lb 3.2 oz (132.1 kg)  Height: 5' 10.5" (1.791 m)      Physical Exam Nursing note reviewed.  Constitutional:      General: He is not in acute distress.    Appearance: Normal appearance. He is well-developed. He is obese. He is not ill-appearing, toxic-appearing or diaphoretic.  HENT:     Head: Normocephalic and atraumatic.     Jaw: No trismus.     Right Ear: Tympanic membrane, ear canal and external ear normal.     Left Ear: Tympanic membrane, ear canal and external ear normal.     Nose: Nose normal. No mucosal edema or rhinorrhea.     Right Sinus: No maxillary sinus tenderness or frontal sinus tenderness.     Left Sinus: No maxillary sinus tenderness or frontal sinus tenderness.     Mouth/Throat:     Mouth: Mucous membranes are moist.     Pharynx: Uvula midline. No oropharyngeal exudate, posterior oropharyngeal erythema or uvula swelling.  Eyes:     General: Lids are normal. No scleral icterus.    Conjunctiva/sclera: Conjunctivae normal.     Pupils: Pupils are equal, round, and reactive to light.  Neck:     Musculoskeletal: Normal range of motion and neck supple.     Trachea: Trachea and phonation normal. No tracheal deviation.  Cardiovascular:     Rate and Rhythm: Normal rate and regular rhythm.     Pulses: Normal pulses.          Radial pulses are 2+ on the right side and 2+ on the left side.       Posterior tibial pulses are 2+ on the right side and 2+ on the left side.     Heart sounds: Normal heart sounds. No  murmur. No friction rub. No gallop.   Pulmonary:     Effort: Pulmonary effort is normal. No respiratory distress.     Breath sounds: Normal breath sounds. No stridor. No wheezing, rhonchi or rales.  Abdominal:     General: Bowel sounds are normal. There is no distension.     Palpations: Abdomen is soft.     Tenderness: There is no abdominal tenderness. There is no guarding or rebound.  Musculoskeletal: Normal range of motion.  Right lower leg: No edema.     Left lower leg: No edema.  Skin:    General: Skin is warm and dry.     Capillary Refill: Capillary refill takes less than 2 seconds.     Coloration: Skin is not jaundiced or pale.     Findings: No rash.  Neurological:     Mental Status: He is alert.     Gait: Gait normal.  Psychiatric:        Mood and Affect: Mood normal.        Speech: Speech normal.        Behavior: Behavior normal.           Assessment & Plan:   Here for 3 month DM recheck - needs refills and needs some clarification on medications - pt of Dr. Dennard Schaumann.  Problem List Items Addressed This Visit      Cardiovascular and Mediastinum   HTN (hypertension)    Intermittent compliance with losartan Just started taking again this week BP at goal today ARB for renal protection in setting of DM Check CMP      Relevant Medications   atorvastatin (LIPITOR) 40 MG tablet     Endocrine   Controlled diabetes mellitus type 2 with complications ([place]) - Primary    Compliant with meds - but would like to change back to actos - due to cost.  Also on trulicity and metformin - compliant Blood sugar reported controlled, off insulin  Recheck labs - due for fasting per Dr. Dennard Schaumann CMP, lipid, A1C  A1C was around ~7, sounds like he is well controlled and can continue oral meds and trulicity and stay off insulin.  Taking ARB and statin       Relevant Medications   Dulaglutide (TRULICITY) 1.5 AS/3.4HD SOPN   atorvastatin (LIPITOR) 40 MG tablet   pioglitazone  (ACTOS) 30 MG tablet   Other Relevant Orders   CBC with Differential/Platelet (Completed)   COMPLETE METABOLIC PANEL WITH GFR (Completed)   Lipid panel (Completed)   Hemoglobin A1c (Completed)     Other   Hypertriglyceridemia    Taking lipitor, no SE Fasting lipid panel today Having difficulty with diet and exercise Lovaza too expensive - using generic  FLP and CMP      Relevant Medications   atorvastatin (LIPITOR) 40 MG tablet   Other Relevant Orders   CBC with Differential/Platelet (Completed)   COMPLETE METABOLIC PANEL WITH GFR (Completed)   Lipid panel (Completed)   Hemoglobin A1c (Completed)   Class 3 severe obesity without serious comorbidity with body mass index (BMI) of 40.0 to 44.9 in adult [facility])    He is worried about weight - change in available foods Encouraged to stay positive, exercise, try to pick the healthy options from available foods, but don't worry during this pandemic, can worry about diet another time, once things calm down.        Relevant Medications   Dulaglutide (TRULICITY) 1.5 QQ/2.2LN SOPN   pioglitazone (ACTOS) 30 MG tablet   Other Relevant Orders   CBC with Differential/Platelet (Completed)   COMPLETE METABOLIC PANEL WITH GFR (Completed)   Lipid panel (Completed)   Hemoglobin A1c (Completed)      3 month f/up with his PCP Trulicity sample given here - contacted his pharmacy and they do have trulicity in stock now    Delsa Grana, PA-C 10/04/18 10:33 AM

## 2018-10-04 NOTE — Patient Instructions (Signed)
Will call you with labs  For Diabetes-   Taking Metformin, trulicity, and actos  For cholesterol -  taking atorvastatin and supplement  Blood pressure-  Losartan

## 2018-10-05 ENCOUNTER — Encounter: Payer: Self-pay | Admitting: Family Medicine

## 2018-10-05 DIAGNOSIS — E781 Pure hyperglyceridemia: Secondary | ICD-10-CM | POA: Insufficient documentation

## 2018-10-05 DIAGNOSIS — Z6841 Body Mass Index (BMI) 40.0 and over, adult: Secondary | ICD-10-CM

## 2018-10-05 DIAGNOSIS — E66813 Obesity, class 3: Secondary | ICD-10-CM | POA: Insufficient documentation

## 2018-10-05 DIAGNOSIS — Z794 Long term (current) use of insulin: Secondary | ICD-10-CM

## 2018-10-05 DIAGNOSIS — I10 Essential (primary) hypertension: Secondary | ICD-10-CM | POA: Insufficient documentation

## 2018-10-05 DIAGNOSIS — E118 Type 2 diabetes mellitus with unspecified complications: Secondary | ICD-10-CM

## 2018-10-05 DIAGNOSIS — E1169 Type 2 diabetes mellitus with other specified complication: Secondary | ICD-10-CM | POA: Insufficient documentation

## 2018-10-05 DIAGNOSIS — E669 Obesity, unspecified: Secondary | ICD-10-CM | POA: Insufficient documentation

## 2018-10-05 LAB — LIPID PANEL
Cholesterol: 169 mg/dL (ref <200)
HDL: 43 mg/dL (ref >=40)
LDL Cholesterol (Calc): 93 mg/dL (calc)
Non-HDL Cholesterol (Calc): 126 mg/dL (calc) (ref <130)
Total CHOL/HDL Ratio: 3.9 (calc) (ref <5.0)
Triglycerides: 241 mg/dL — ABNORMAL HIGH (ref <150)

## 2018-10-05 LAB — COMPLETE METABOLIC PANEL WITH GFR
AG Ratio: 1.7 (calc) (ref 1.0–2.5)
ALT: 20 U/L (ref 9–46)
AST: 19 U/L (ref 10–40)
Albumin: 4.5 g/dL (ref 3.6–5.1)
Alkaline phosphatase (APISO): 94 U/L (ref 36–130)
BUN: 16 mg/dL (ref 7–25)
CO2: 24 mmol/L (ref 20–32)
Calcium: 9.4 mg/dL (ref 8.6–10.3)
Chloride: 108 mmol/L (ref 98–110)
Creat: 0.98 mg/dL (ref 0.60–1.35)
GFR, Est African American: 105 mL/min/{1.73_m2} (ref >=60)
GFR, Est Non African American: 91 mL/min/{1.73_m2} (ref >=60)
Globulin: 2.7 g/dL (calc) (ref 1.9–3.7)
Glucose, Bld: 103 mg/dL — ABNORMAL HIGH (ref 65–99)
Potassium: 4.4 mmol/L (ref 3.5–5.3)
Sodium: 141 mmol/L (ref 135–146)
Total Bilirubin: 0.5 mg/dL (ref 0.2–1.2)
Total Protein: 7.2 g/dL (ref 6.1–8.1)

## 2018-10-05 LAB — CBC WITH DIFFERENTIAL/PLATELET
Absolute Monocytes: 810 cells/uL (ref 200–950)
Basophils Absolute: 44 cells/uL (ref 0–200)
Basophils Relative: 0.5 %
Eosinophils Absolute: 88 cells/uL (ref 15–500)
Eosinophils Relative: 1 %
HCT: 45 % (ref 38.5–50.0)
Hemoglobin: 15.4 g/dL (ref 13.2–17.1)
Lymphs Abs: 2878 cells/uL (ref 850–3900)
MCH: 30.1 pg (ref 27.0–33.0)
MCHC: 34.2 g/dL (ref 32.0–36.0)
MCV: 87.9 fL (ref 80.0–100.0)
MPV: 11 fL (ref 7.5–12.5)
Monocytes Relative: 9.2 %
Neutro Abs: 4981 cells/uL (ref 1500–7800)
Neutrophils Relative %: 56.6 %
Platelets: 208 10*3/uL (ref 140–400)
RBC: 5.12 10*6/uL (ref 4.20–5.80)
RDW: 12.6 % (ref 11.0–15.0)
Total Lymphocyte: 32.7 %
WBC: 8.8 10*3/uL (ref 3.8–10.8)

## 2018-10-05 LAB — HEMOGLOBIN A1C
Hgb A1c MFr Bld: 7.2 % of total Hgb — ABNORMAL HIGH (ref <5.7)
Mean Plasma Glucose: 160 (calc)
eAG (mmol/L): 8.9 (calc)

## 2018-10-05 NOTE — Assessment & Plan Note (Signed)
Taking lipitor, no SE Fasting lipid panel today Having difficulty with diet and exercise Lovaza too expensive - using generic  FLP and CMP

## 2018-10-05 NOTE — Assessment & Plan Note (Signed)
He is worried about weight - change in available foods Encouraged to stay positive, exercise, try to pick the healthy options from available foods, but don't worry during this pandemic, can worry about diet another time, once things calm down.

## 2018-10-05 NOTE — Assessment & Plan Note (Signed)
Intermittent compliance with losartan Just started taking again this week BP at goal today ARB for renal protection in setting of DM Check CMP

## 2018-10-05 NOTE — Assessment & Plan Note (Addendum)
Compliant with meds - but would like to change back to actos - due to cost.  Also on trulicity and metformin - compliant Blood sugar reported controlled, off insulin  Recheck labs - due for fasting per Dr. Dennard Schaumann CMP, lipid, A1C  A1C was around ~7, sounds like he is well controlled and can continue oral meds and trulicity and stay off insulin.  Taking ARB and statin

## 2018-10-22 ENCOUNTER — Ambulatory Visit: Payer: Self-pay | Admitting: *Deleted

## 2018-10-22 NOTE — Telephone Encounter (Signed)
Pt called COVID line stating that his nephew was with a friend who tested positive on 10/21/2018; the pt was in contact with his nephew 10/21/2018; nurse triage initiated and recommendations made per protocol; he verbalized understanding; the pt normally sees Dr Dennard Schaumann, [facility], and the pt will notify him.   Reason for Disposition . [1] COVID-19 EXPOSURE (Close Contact) within last 14 days AND [2] NO cough, fever, or breathing difficulty  Answer Assessment - Initial Assessment Questions 1. CLOSE CONTACT: "Who is the person with the confirmed or suspected COVID-19 infection that you were exposed to?"     Pt's nephew 2. PLACE of CONTACT: "Where were you when you were exposed to COVID-19?" (e.g., home, school, medical waiting room; which city?)    home 3. TYPE of CONTACT: "How much contact was there?" (e.g., sitting next to, live in same house, work in same office, same building)     Same house 4. DURATION of CONTACT: "How long were you in contact with the COVID-19 patient?" (e.g., a few seconds, passed by person, a few minutes, live with the patient)     **10-15 min 5. DATE of CONTACT: "When did you have contact with a COVID-19 patient?" (e.g., how many days ago)     10/21/2018 6. TRAVEL: "Have you traveled out of the country recently?" If so, "When and where?"     * Also ask about out-of-state travel, since the CDC has identified some high risk cities for community spread in the [place].     * Note: Travel becomes less relevant if there is widespread community transmission where the patient lives.     no 7. COMMUNITY SPREAD: "Are there lots of cases or COVID-19 (community spread) where you live?" (See public health department website, if unsure)   * MAJOR community spread: high number of cases; numbers of cases are increasing; many people hospitalized.   * MINOR community spread: low number of cases; not increasing; few or no people hospitalized  major 8. SYMPTOMS: "Do you have  any symptoms?" (e.g., fever, cough, breathing difficulty)     no 9. PREGNANCY OR POSTPARTUM: "Is there any chance you are pregnant?" "When was your last menstrual period?" "Did you deliver in the last 2 weeks?"     n/a 10. HIGH RISK: "Do you have any heart or lung problems? Do you have a weak immune system?" (e.g., CHF, COPD, asthma, HIV positive, chemotherapy, renal failure, diabetes mellitus, sickle cell anemia)       diabetic  Protocols used: CORONAVIRUS (COVID-19) EXPOSURE-A-AH

## 2018-10-25 ENCOUNTER — Ambulatory Visit (INDEPENDENT_AMBULATORY_CARE_PROVIDER_SITE_OTHER): Payer: BLUE CROSS/BLUE SHIELD | Admitting: Family Medicine

## 2018-10-25 ENCOUNTER — Other Ambulatory Visit: Payer: Self-pay

## 2018-10-25 DIAGNOSIS — Z20822 Contact with and (suspected) exposure to covid-19: Secondary | ICD-10-CM

## 2018-10-25 DIAGNOSIS — Z20828 Contact with and (suspected) exposure to other viral communicable diseases: Secondary | ICD-10-CM

## 2018-10-25 NOTE — Progress Notes (Signed)
Subjective:    Patient ID: Cody Curry, male    DOB: 09-May-1971, 48 y.o.   MRN: [id number]  HPI Patient is being seen today as a telephone visit.  Patient is at home.  Phone call began at 1033.  Phone call ended at 1041.  Patient's nephew was at a birthday party on Saturday.  At the birthday party, there was another individual there who had been sick earlier in the week.  That person had a recovered by the time of the party.  However they have been tested earlier in the week and Sunday they were notified that they were positive for coronavirus.  Therefore the patient's nephew potentially was exposed to someone who had coronavirus.  On Sunday the patient's nephew visited him for a short period of time.  The patient's nephew is asymptomatic.  He had been exposed less than 24 hours prior.  It was not close contact.  Therefore I believe the likelihood that he had contracted the virus is low.  Furthermore I believe the incubation period was too short for him to actually then be spreading the virus.  He is still asymptomatic.  However the patient is concerned that he should be quarantined at home.  He is completely asymptomatic Past Medical History:  Diagnosis Date  . Diabetes mellitus type 2 in obese ([place])   . Diabetic ketoacidosis without coma associated with type 2 diabetes mellitus ([place])   . DKA, type 2 ([place]) 03/28/2017  . Hypertriglyceridemia    Past Surgical History:  Procedure Laterality Date  . REPLACEMENT TOTAL KNEE     Current Outpatient Medications on File Prior to Visit  Medication Sig Dispense Refill  . atorvastatin (LIPITOR) 40 MG tablet Take 1 tablet (40 mg total) by mouth daily. 90 tablet 3  . Dulaglutide (TRULICITY) 1.5 NL/8.9QJ SOPN Inject 1.5 mg into the skin once a week. 4 pen 11  . losartan (COZAAR) 50 MG tablet Take 1 tablet (50 mg total) by mouth daily. 90 tablet 3  . metFORMIN (GLUCOPHAGE) 500 MG tablet TAKE 1 TABLET BY MOUTH TWICE A DAY WITH A MEAL 60 tablet 11  .  omega-3 acid ethyl esters (LOVAZA) 1 g capsule TAKE 2 CAPSULES BY MOUTH 2 (TWO) TIMES DAILY. 120 capsule 5  . pioglitazone (ACTOS) 30 MG tablet Take 1 tablet (30 mg total) by mouth daily. 30 tablet 3  . sildenafil (VIAGRA) 100 MG tablet Take 0.5-1 tablets (50-100 mg total) by mouth daily as needed for erectile dysfunction. 5 tablet 11   No current facility-administered medications on file prior to visit.    No Known Allergies Social History   Socioeconomic History  . Marital status: Married    Spouse name: Not on file  . Number of children: Not on file  . Years of education: Not on file  . Highest education level: Not on file  Occupational History  . Not on file  Social Needs  . Financial resource strain: Not on file  . Food insecurity:    Worry: Not on file    Inability: Not on file  . Transportation needs:    Medical: Not on file    Non-medical: Not on file  Tobacco Use  . Smoking status: Former Smoker    Packs/day: 1.50    Types: Cigarettes  . Smokeless tobacco: Never Used  Substance and Sexual Activity  . Alcohol use: No    Alcohol/week: 0.0 standard drinks    Comment: Very occasional  . Drug use: No  Comment: quit one year ago  . Sexual activity: Not on file  Lifestyle  . Physical activity:    Days per week: Not on file    Minutes per session: Not on file  . Stress: Not on file  Relationships  . Social connections:    Talks on phone: Not on file    Gets together: Not on file    Attends religious service: Not on file    Active member of club or organization: Not on file    Attends meetings of clubs or organizations: Not on file    Relationship status: Not on file  . Intimate partner violence:    Fear of current or ex partner: Not on file    Emotionally abused: Not on file    Physically abused: Not on file    Forced sexual activity: Not on file  Other Topics Concern  . Not on file  Social History Narrative  . Not on file      Review of Systems  All  other systems reviewed and are negative.      Objective:   Physical Exam  No physical exam was performed today as this was a telephone call      Assessment & Plan:  Patient has potential exposure to coronavirus secondhand.  I believe the likelihood that he has been exposed to the virus is very very low and therefore I believe the patient is very low risk to have a virus at the present time.  However as I explained to the patient I cannot be 989% certain.  Therefore I am willing to agree that the patient should be quarantined for more for 14 days from exposure on Sunday.  He could therefore then returned to work on April 27.

## 2018-11-02 ENCOUNTER — Encounter: Payer: Self-pay | Admitting: Family Medicine

## 2018-11-19 ENCOUNTER — Other Ambulatory Visit: Payer: Self-pay

## 2018-11-19 ENCOUNTER — Ambulatory Visit: Payer: BLUE CROSS/BLUE SHIELD | Admitting: Family Medicine

## 2018-11-19 ENCOUNTER — Encounter: Payer: Self-pay | Admitting: Family Medicine

## 2018-11-19 VITALS — BP 110/70 | HR 86 | Temp 98.7°F | Resp 18 | Ht 70.0 in | Wt 293.0 lb

## 2018-11-19 DIAGNOSIS — M25561 Pain in right knee: Secondary | ICD-10-CM | POA: Diagnosis not present

## 2018-11-19 DIAGNOSIS — M7121 Synovial cyst of popliteal space [Baker], right knee: Secondary | ICD-10-CM | POA: Diagnosis not present

## 2018-11-19 MED ORDER — MELOXICAM 15 MG PO TABS: 15.0000 mg | ORAL_TABLET | Freq: Every day | ORAL | 0 refills | Status: DC

## 2018-11-19 NOTE — Progress Notes (Signed)
Subjective:    Patient ID: Cody Curry, male    DOB: 12-17-1970, 48 y.o.   MRN: [id number]  HPI Patient is being seen with a one-month history of pain and stiffness in his right knee.  The pain and stiffness is located in the popliteal fossa primarily on the lateral aspect.  Patient has swelling behind the knee consistent with a Baker's cyst.  He also has a positive Apley grind causing pain on the lateral aspect of his knee.  There is no laxity to varus or valgus stress.  He has a negative anterior and posterior drawer sign.  There is no significant anterior effusion.  However he does have pain with flexion greater than 90 degrees.  The patient has full extension.  He denies any significant injury.  He does not recall when the pain started but is been gradually getting worse over the last month.  He has not taken any regular anti-inflammatory for this yet Past Medical History:  Diagnosis Date  . Diabetes mellitus type 2 in obese ([place])   . Diabetic ketoacidosis without coma associated with type 2 diabetes mellitus ([place])   . DKA, type 2 ([place]) 03/28/2017  . Hypertriglyceridemia    Past Surgical History:  Procedure Laterality Date  . REPLACEMENT TOTAL KNEE     Current Outpatient Medications on File Prior to Visit  Medication Sig Dispense Refill  . atorvastatin (LIPITOR) 40 MG tablet Take 1 tablet (40 mg total) by mouth daily. 90 tablet 3  . Dulaglutide (TRULICITY) 1.5 YT/0.1SW SOPN Inject 1.5 mg into the skin once a week. 4 pen 11  . losartan (COZAAR) 50 MG tablet Take 1 tablet (50 mg total) by mouth daily. 90 tablet 3  . metFORMIN (GLUCOPHAGE) 500 MG tablet TAKE 1 TABLET BY MOUTH TWICE A DAY WITH A MEAL 60 tablet 11  . omega-3 acid ethyl esters (LOVAZA) 1 g capsule TAKE 2 CAPSULES BY MOUTH 2 (TWO) TIMES DAILY. 120 capsule 5  . pioglitazone (ACTOS) 30 MG tablet Take 1 tablet (30 mg total) by mouth daily. 30 tablet 3  . sildenafil (VIAGRA) 100 MG tablet Take 0.5-1 tablets (50-100 mg total) by  mouth daily as needed for erectile dysfunction. 5 tablet 11   No current facility-administered medications on file prior to visit.    No Known Allergies Social History   Socioeconomic History  . Marital status: Married    Spouse name: Not on file  . Number of children: Not on file  . Years of education: Not on file  . Highest education level: Not on file  Occupational History  . Not on file  Social Needs  . Financial resource strain: Not on file  . Food insecurity:    Worry: Not on file    Inability: Not on file  . Transportation needs:    Medical: Not on file    Non-medical: Not on file  Tobacco Use  . Smoking status: Former Smoker    Packs/day: 1.50    Types: Cigarettes  . Smokeless tobacco: Never Used  Substance and Sexual Activity  . Alcohol use: No    Alcohol/week: 0.0 standard drinks    Comment: Very occasional  . Drug use: No    Comment: quit one year ago  . Sexual activity: Not on file  Lifestyle  . Physical activity:    Days per week: Not on file    Minutes per session: Not on file  . Stress: Not on file  Relationships  . Social  connections:    Talks on phone: Not on file    Gets together: Not on file    Attends religious service: Not on file    Active member of club or organization: Not on file    Attends meetings of clubs or organizations: Not on file    Relationship status: Not on file  . Intimate partner violence:    Fear of current or ex partner: Not on file    Emotionally abused: Not on file    Physically abused: Not on file    Forced sexual activity: Not on file  Other Topics Concern  . Not on file  Social History Narrative  . Not on file      Review of Systems  All other systems reviewed and are negative.      Objective:   Physical Exam Vitals signs reviewed.  Constitutional:      Appearance: He is obese.  Cardiovascular:     Rate and Rhythm: Normal rate and regular rhythm.     Pulses: Normal pulses.     Heart sounds: Normal  heart sounds.  Pulmonary:     Effort: Pulmonary effort is normal.     Breath sounds: Normal breath sounds.  Musculoskeletal:     Right knee: He exhibits decreased range of motion, swelling, effusion and abnormal meniscus. No medial joint line, no lateral joint line, no MCL and no LCL tenderness noted.  Neurological:     Mental Status: He is alert.           Assessment & Plan:  Acute pain of right knee - Plan: meloxicam (MOBIC) 15 MG tablet  Baker cyst, right  I believe the patient has stiffness and swelling behind his right knee due to a Baker's cyst most likely due to a lateral meniscus tear.  Also suspect some underlying osteoarthritis.  We will start the patient on meloxicam 15 mg a day and see how this does.  If no improvement is seen over the next 2 weeks the next that would be a cortisone injection and/or consideration for an MRI of the knee to evaluate further.  Patient will notify me via telephone in 1 to 2 weeks to give me an update on how the pain is doing on the meloxicam.  He is due to recheck a hemoglobin A1c in July.

## 2018-12-11 ENCOUNTER — Other Ambulatory Visit: Payer: Self-pay | Admitting: Family Medicine

## 2018-12-11 DIAGNOSIS — M25561 Pain in right knee: Secondary | ICD-10-CM

## 2018-12-11 NOTE — Telephone Encounter (Signed)
Requested Prescriptions   Pending Prescriptions Disp Refills  . meloxicam (MOBIC) 15 MG tablet [Pharmacy Med Name: MELOXICAM 15 MG TABLET] 30 tablet 0    Sig: TAKE 1 TABLET BY MOUTH EVERY DAY   Last OV 11/19/2018 Last written 11/19/2018

## 2018-12-20 ENCOUNTER — Encounter: Payer: Self-pay | Admitting: Family Medicine

## 2018-12-20 ENCOUNTER — Ambulatory Visit: Payer: BLUE CROSS/BLUE SHIELD | Admitting: Family Medicine

## 2018-12-20 ENCOUNTER — Other Ambulatory Visit: Payer: Self-pay

## 2018-12-20 VITALS — BP 130/72 | HR 88 | Temp 98.7°F | Resp 16 | Ht 70.0 in | Wt 292.0 lb

## 2018-12-20 DIAGNOSIS — M7121 Synovial cyst of popliteal space [Baker], right knee: Secondary | ICD-10-CM

## 2018-12-20 DIAGNOSIS — M25561 Pain in right knee: Secondary | ICD-10-CM

## 2018-12-20 NOTE — Progress Notes (Signed)
Subjective:    Patient ID: Cody Curry, male    DOB: Apr 12, 1971, 48 y.o.   MRN: [id number]  HPI  11/19/18 Patient is being seen with a one-month history of pain and stiffness in his right knee.  The pain and stiffness is located in the popliteal fossa primarily on the lateral aspect.  Patient has swelling behind the knee consistent with a Baker's cyst.  He also has a positive Apley grind causing pain on the lateral aspect of his knee.  There is no laxity to varus or valgus stress.  He has a negative anterior and posterior drawer sign.  There is no significant anterior effusion.  However he does have pain with flexion greater than 90 degrees.  The patient has full extension.  He denies any significant injury.  He does not recall when the pain started but is been gradually getting worse over the last month.  He has not taken any regular anti-inflammatory for this yet.  At that time, my plan was: I believe the patient has stiffness and swelling behind his right knee due to a Baker's cyst most likely due to a lateral meniscus tear.  Also suspect some underlying osteoarthritis.  We will start the patient on meloxicam 15 mg a day and see how this does.  If no improvement is seen over the next 2 weeks the next that would be a cortisone injection and/or consideration for an MRI of the knee to evaluate further.  Patient will notify me via telephone in 1 to 2 weeks to give me an update on how the pain is doing on the meloxicam.  He is due to recheck a hemoglobin A1c in July.  12/20/18 Pain is no better.  Knee is swollen with a small Baker's cyst.  Now having medial joint line pain and pain in the posterolateral joint line.  Past Medical History:  Diagnosis Date  . Diabetes mellitus type 2 in obese ([place])   . Diabetic ketoacidosis without coma associated with type 2 diabetes mellitus ([place])   . DKA, type 2 ([place]) 03/28/2017  . Hypertriglyceridemia    Past Surgical History:  Procedure Laterality Date  .  REPLACEMENT TOTAL KNEE     Current Outpatient Medications on File Prior to Visit  Medication Sig Dispense Refill  . atorvastatin (LIPITOR) 40 MG tablet Take 1 tablet (40 mg total) by mouth daily. 90 tablet 3  . Dulaglutide (TRULICITY) 1.5 SW/5.4OE SOPN Inject 1.5 mg into the skin once a week. 4 pen 11  . losartan (COZAAR) 50 MG tablet Take 1 tablet (50 mg total) by mouth daily. 90 tablet 3  . meloxicam (MOBIC) 15 MG tablet TAKE 1 TABLET BY MOUTH EVERY DAY 30 tablet 3  . metFORMIN (GLUCOPHAGE) 500 MG tablet TAKE 1 TABLET BY MOUTH TWICE A DAY WITH A MEAL 60 tablet 11  . omega-3 acid ethyl esters (LOVAZA) 1 g capsule TAKE 2 CAPSULES BY MOUTH 2 (TWO) TIMES DAILY. 120 capsule 5  . pioglitazone (ACTOS) 30 MG tablet Take 1 tablet (30 mg total) by mouth daily. 30 tablet 3  . sildenafil (VIAGRA) 100 MG tablet Take 0.5-1 tablets (50-100 mg total) by mouth daily as needed for erectile dysfunction. 5 tablet 11   No current facility-administered medications on file prior to visit.    No Known Allergies Social History   Socioeconomic History  . Marital status: Married    Spouse name: Not on file  . Number of children: Not on file  . Years  of education: Not on file  . Highest education level: Not on file  Occupational History  . Not on file  Social Needs  . Financial resource strain: Not on file  . Food insecurity    Worry: Not on file    Inability: Not on file  . Transportation needs    Medical: Not on file    Non-medical: Not on file  Tobacco Use  . Smoking status: Former Smoker    Packs/day: 1.50    Types: Cigarettes  . Smokeless tobacco: Never Used  Substance and Sexual Activity  . Alcohol use: No    Alcohol/week: 0.0 standard drinks    Comment: Very occasional  . Drug use: No    Comment: quit one year ago  . Sexual activity: Not on file  Lifestyle  . Physical activity    Days per week: Not on file    Minutes per session: Not on file  . Stress: Not on file  Relationships  .  Social Herbalist on phone: Not on file    Gets together: Not on file    Attends religious service: Not on file    Active member of club or organization: Not on file    Attends meetings of clubs or organizations: Not on file    Relationship status: Not on file  . Intimate partner violence    Fear of current or ex partner: Not on file    Emotionally abused: Not on file    Physically abused: Not on file    Forced sexual activity: Not on file  Other Topics Concern  . Not on file  Social History Narrative  . Not on file      Review of Systems  All other systems reviewed and are negative.      Objective:   Physical Exam Vitals signs reviewed.  Constitutional:      Appearance: He is obese.  Cardiovascular:     Rate and Rhythm: Normal rate and regular rhythm.     Pulses: Normal pulses.     Heart sounds: Normal heart sounds.  Pulmonary:     Effort: Pulmonary effort is normal.     Breath sounds: Normal breath sounds.  Musculoskeletal:     Right knee: He exhibits decreased range of motion, swelling, effusion and abnormal meniscus. Tenderness found. Medial joint line tenderness noted. No lateral joint line, no MCL and no LCL tenderness noted.  Neurological:     Mental Status: He is alert.           Assessment & Plan:  1. Acute pain of right knee  2. Baker cyst, right  Suspect meniscal tear vs arthritis.  Using sterile technique, injected 2 cc of lidocaine, 2 cc of marcaine, and 2 cc of 40 mg/ml kenalog.  Tolerated procedure well. If no better, recommend mri to evaluate for meniscal tear.

## 2018-12-26 ENCOUNTER — Other Ambulatory Visit: Payer: Self-pay | Admitting: Family Medicine

## 2018-12-26 DIAGNOSIS — Z79899 Other long term (current) drug therapy: Secondary | ICD-10-CM

## 2018-12-26 DIAGNOSIS — E118 Type 2 diabetes mellitus with unspecified complications: Secondary | ICD-10-CM

## 2018-12-26 DIAGNOSIS — Z6841 Body Mass Index (BMI) 40.0 and over, adult: Secondary | ICD-10-CM

## 2018-12-26 DIAGNOSIS — I10 Essential (primary) hypertension: Secondary | ICD-10-CM

## 2018-12-26 DIAGNOSIS — E781 Pure hyperglyceridemia: Secondary | ICD-10-CM

## 2018-12-27 ENCOUNTER — Other Ambulatory Visit: Payer: Self-pay | Admitting: Family Medicine

## 2019-01-03 ENCOUNTER — Other Ambulatory Visit: Payer: Self-pay

## 2019-01-03 ENCOUNTER — Other Ambulatory Visit: Payer: BC Managed Care – PPO

## 2019-01-03 DIAGNOSIS — I10 Essential (primary) hypertension: Secondary | ICD-10-CM | POA: Diagnosis not present

## 2019-01-03 DIAGNOSIS — E118 Type 2 diabetes mellitus with unspecified complications: Secondary | ICD-10-CM

## 2019-01-03 DIAGNOSIS — E781 Pure hyperglyceridemia: Secondary | ICD-10-CM | POA: Diagnosis not present

## 2019-01-03 DIAGNOSIS — Z79899 Other long term (current) drug therapy: Secondary | ICD-10-CM

## 2019-01-03 DIAGNOSIS — Z6841 Body Mass Index (BMI) 40.0 and over, adult: Secondary | ICD-10-CM | POA: Diagnosis not present

## 2019-01-04 ENCOUNTER — Other Ambulatory Visit: Payer: BC Managed Care – PPO

## 2019-01-04 LAB — LIPID PANEL
Cholesterol: 157 mg/dL (ref <200)
HDL: 54 mg/dL (ref >=40)
LDL Cholesterol (Calc): 82 mg/dL (calc)
Non-HDL Cholesterol (Calc): 103 mg/dL (calc) (ref <130)
Total CHOL/HDL Ratio: 2.9 (calc) (ref <5.0)
Triglycerides: 115 mg/dL (ref <150)

## 2019-01-04 LAB — COMPREHENSIVE METABOLIC PANEL
AG Ratio: 1.6 (calc) (ref 1.0–2.5)
ALT: 15 U/L (ref 9–46)
AST: 17 U/L (ref 10–40)
Albumin: 4.5 g/dL (ref 3.6–5.1)
Alkaline phosphatase (APISO): 79 U/L (ref 36–130)
BUN: 22 mg/dL (ref 7–25)
CO2: 25 mmol/L (ref 20–32)
Calcium: 9.2 mg/dL (ref 8.6–10.3)
Chloride: 104 mmol/L (ref 98–110)
Creat: 1.03 mg/dL (ref 0.60–1.35)
Globulin: 2.8 g/dL (calc) (ref 1.9–3.7)
Glucose, Bld: 101 mg/dL — ABNORMAL HIGH (ref 65–99)
Potassium: 4.6 mmol/L (ref 3.5–5.3)
Sodium: 138 mmol/L (ref 135–146)
Total Bilirubin: 0.4 mg/dL (ref 0.2–1.2)
Total Protein: 7.3 g/dL (ref 6.1–8.1)

## 2019-01-04 LAB — CBC WITH DIFFERENTIAL/PLATELET
Absolute Monocytes: 798 cells/uL (ref 200–950)
Basophils Absolute: 53 cells/uL (ref 0–200)
Basophils Relative: 0.5 %
Eosinophils Absolute: 95 cells/uL (ref 15–500)
Eosinophils Relative: 0.9 %
HCT: 44 % (ref 38.5–50.0)
Hemoglobin: 14.9 g/dL (ref 13.2–17.1)
Lymphs Abs: 2940 cells/uL (ref 850–3900)
MCH: 31 pg (ref 27.0–33.0)
MCHC: 33.9 g/dL (ref 32.0–36.0)
MCV: 91.5 fL (ref 80.0–100.0)
MPV: 10.2 fL (ref 7.5–12.5)
Monocytes Relative: 7.6 %
Neutro Abs: 6615 cells/uL (ref 1500–7800)
Neutrophils Relative %: 63 %
Platelets: 201 10*3/uL (ref 140–400)
RBC: 4.81 10*6/uL (ref 4.20–5.80)
RDW: 12.6 % (ref 11.0–15.0)
Total Lymphocyte: 28 %
WBC: 10.5 10*3/uL (ref 3.8–10.8)

## 2019-01-04 LAB — HEMOGLOBIN A1C
Hgb A1c MFr Bld: 6.4 % of total Hgb — ABNORMAL HIGH (ref <5.7)
Mean Plasma Glucose: 137 (calc)
eAG (mmol/L): 7.6 (calc)

## 2019-01-07 ENCOUNTER — Ambulatory Visit: Payer: BLUE CROSS/BLUE SHIELD | Admitting: Family Medicine

## 2019-01-14 ENCOUNTER — Ambulatory Visit (INDEPENDENT_AMBULATORY_CARE_PROVIDER_SITE_OTHER): Payer: BC Managed Care – PPO | Admitting: Family Medicine

## 2019-01-14 ENCOUNTER — Other Ambulatory Visit: Payer: Self-pay

## 2019-01-14 ENCOUNTER — Encounter: Payer: Self-pay | Admitting: Family Medicine

## 2019-01-14 VITALS — BP 110/74 | HR 78 | Temp 98.6°F | Resp 16 | Ht 70.0 in | Wt 295.0 lb

## 2019-01-14 DIAGNOSIS — E118 Type 2 diabetes mellitus with unspecified complications: Secondary | ICD-10-CM | POA: Diagnosis not present

## 2019-01-14 DIAGNOSIS — E781 Pure hyperglyceridemia: Secondary | ICD-10-CM

## 2019-01-14 DIAGNOSIS — I10 Essential (primary) hypertension: Secondary | ICD-10-CM | POA: Diagnosis not present

## 2019-01-14 DIAGNOSIS — Z6841 Body Mass Index (BMI) 40.0 and over, adult: Secondary | ICD-10-CM

## 2019-01-14 DIAGNOSIS — M25561 Pain in right knee: Secondary | ICD-10-CM

## 2019-01-14 NOTE — Progress Notes (Signed)
Subjective:    Patient ID: Cody Curry, male    DOB: April 20, 1971, 48 y.o.   MRN: [id number]  HPI  11/19/18 Patient is being seen with a one-month history of pain and stiffness in his right knee.  The pain and stiffness is located in the popliteal fossa primarily on the lateral aspect.  Patient has swelling behind the knee consistent with a Baker's cyst.  He also has a positive Apley grind causing pain on the lateral aspect of his knee.  There is no laxity to varus or valgus stress.  He has a negative anterior and posterior drawer sign.  There is no significant anterior effusion.  However he does have pain with flexion greater than 90 degrees.  The patient has full extension.  He denies any significant injury.  He does not recall when the pain started but is been gradually getting worse over the last month.  He has not taken any regular anti-inflammatory for this yet.  At that time, my plan was: I believe the patient has stiffness and swelling behind his right knee due to a Baker's cyst most likely due to a lateral meniscus tear.  Also suspect some underlying osteoarthritis.  We will start the patient on meloxicam 15 mg a day and see how this does.  If no improvement is seen over the next 2 weeks the next that would be a cortisone injection and/or consideration for an MRI of the knee to evaluate further.  Patient will notify me via telephone in 1 to 2 weeks to give me an update on how the pain is doing on the meloxicam.  He is due to recheck a hemoglobin A1c in July.  12/20/18 Pain is no better.  Knee is swollen with a small Baker's cyst.  Now having medial joint line pain and pain in the posterolateral joint line. At that time, my plan was: 1. Acute pain of right knee  2. Baker cyst, right  Suspect meniscal tear vs arthritis.  Using sterile technique, injected 2 cc of lidocaine, 2 cc of marcaine, and 2 cc of 40 mg/ml kenalog.  Tolerated procedure well. If no better, recommend mri to evaluate for  meniscal tear.     01/14/19 Patient is here today for follow-up.  He saw no benefit from the cortisone injection in his right knee.  He continues to report periodic effusions with prolonged standing.  He continues to have pain over the medial joint line however the majority of his pain is anterior rather than posterior.  He continues to have an occasional Baker's cyst that seems to wax and wane depending upon his level of activity.  His primary reason for being here today is a follow-up of his diabetes.  In March he was started on Actos.  At that time his hemoglobin A1c was 7.2.  It has since fallen to 6.4!.  His LDL cholesterol has dropped approximately 10 points and is in the 80s.  His triglycerides fell over 100 points and is now well controlled.  His HDL cholesterol also has risen approximately 10 points.  Please see the lab work below.  Patient has gained 4 pounds but this does not appear to be due to the Actos as he has no pitting edema.  The majority of his weight gain has come from inactivity and diet due to his knee pain.  He denies any hypoglycemia.  He denies any chest pain or shortness of breath or dyspnea on exertion Appointment on 01/03/2019  Component  Date Value Ref Range Status   WBC 01/03/2019 10.5  3.8 - 10.8 Thousand/uL Final   RBC 01/03/2019 4.81  4.20 - 5.80 Million/uL Final   Hemoglobin 01/03/2019 14.9  13.2 - 17.1 g/dL Final   HCT 01/03/2019 44.0  38.5 - 50.0 % Final   MCV 01/03/2019 91.5  80.0 - 100.0 fL Final   MCH 01/03/2019 31.0  27.0 - 33.0 pg Final   MCHC 01/03/2019 33.9  32.0 - 36.0 g/dL Final   RDW 01/03/2019 12.6  11.0 - 15.0 % Final   Platelets 01/03/2019 201  140 - 400 Thousand/uL Final   MPV 01/03/2019 10.2  7.5 - 12.5 fL Final   Neutro Abs 01/03/2019 6,615  1,500 - 7,800 cells/uL Final   Lymphs Abs 01/03/2019 2,940  850 - 3,900 cells/uL Final   Absolute Monocytes 01/03/2019 798  200 - 950 cells/uL Final   Eosinophils Absolute 01/03/2019 95  15 - 500  cells/uL Final   Basophils Absolute 01/03/2019 53  0 - 200 cells/uL Final   Neutrophils Relative % 01/03/2019 63  % Final   Total Lymphocyte 01/03/2019 28.0  % Final   Monocytes Relative 01/03/2019 7.6  % Final   Eosinophils Relative 01/03/2019 0.9  % Final   Basophils Relative 01/03/2019 0.5  % Final   Glucose, Bld 01/03/2019 101* 65 - 99 mg/dL Final   Comment: .            Fasting reference interval . For someone without known diabetes, a glucose value between 100 and 125 mg/dL is consistent with prediabetes and should be confirmed with a follow-up test. .    BUN 01/03/2019 22  7 - 25 mg/dL Final   Creat 01/03/2019 1.03  0.60 - 1.35 mg/dL Final   BUN/Creatinine Ratio 18/29/9371 NOT APPLICABLE  6 - 22 (calc) Final   Sodium 01/03/2019 138  135 - 146 mmol/L Final   Potassium 01/03/2019 4.6  3.5 - 5.3 mmol/L Final   Chloride 01/03/2019 104  98 - 110 mmol/L Final   CO2 01/03/2019 25  20 - 32 mmol/L Final   Calcium 01/03/2019 9.2  8.6 - 10.3 mg/dL Final   Total Protein 01/03/2019 7.3  6.1 - 8.1 g/dL Final   Albumin 01/03/2019 4.5  3.6 - 5.1 g/dL Final   Globulin 01/03/2019 2.8  1.9 - 3.7 g/dL (calc) Final   AG Ratio 01/03/2019 1.6  1.0 - 2.5 (calc) Final   Total Bilirubin 01/03/2019 0.4  0.2 - 1.2 mg/dL Final   Alkaline phosphatase (APISO) 01/03/2019 79  36 - 130 U/L Final   AST 01/03/2019 17  10 - 40 U/L Final   ALT 01/03/2019 15  9 - 46 U/L Final   Hgb A1c MFr Bld 01/03/2019 6.4* <5.7 % of total Hgb Final   Comment: For someone without known diabetes, a hemoglobin  A1c value between 5.7% and 6.4% is consistent with prediabetes and should be confirmed with a  follow-up test. . For someone with known diabetes, a value <7% indicates that their diabetes is well controlled. A1c targets should be individualized based on duration of diabetes, age, comorbid conditions, and other considerations. . This assay result is consistent with an increased risk of  diabetes. . Currently, no consensus exists regarding use of hemoglobin A1c for diagnosis of diabetes for children. .    Mean Plasma Glucose 01/03/2019 137  (calc) Final   eAG (mmol/L) 01/03/2019 7.6  (calc) Final   Cholesterol 01/03/2019 157  <200 mg/dL Final  HDL 01/03/2019 54  > OR = 40 mg/dL Final   Triglycerides 01/03/2019 115  <150 mg/dL Final   LDL Cholesterol (Calc) 01/03/2019 82  mg/dL (calc) Final   Comment: Reference range: <100 . Desirable range <100 mg/dL for primary prevention;   <70 mg/dL for patients with CHD or diabetic patients  with > or = 2 CHD risk factors. Marland Kitchen LDL-C is now calculated using the Martin-Hopkins  calculation, which is a validated novel method providing  better accuracy than the Friedewald equation in the  estimation of LDL-C.  Cresenciano Genre et al. Annamaria Helling. 2841;324(40): 2061-2068  (http://education.[facility].com/faq/FAQ164)    Total CHOL/HDL Ratio 01/03/2019 2.9  <5.0 (calc) Final   Non-HDL Cholesterol (Calc) 01/03/2019 103  <130 mg/dL (calc) Final   Comment: For patients with diabetes plus 1 major ASCVD risk  factor, treating to a non-HDL-C goal of <100 mg/dL  (LDL-C of <70 mg/dL) is considered a therapeutic  option.     Past Medical History:  Diagnosis Date   Diabetes mellitus type 2 in obese [facility])    Diabetic ketoacidosis without coma associated with type 2 diabetes mellitus ([place])    DKA, type 2 ([place]) 03/28/2017   Hypertriglyceridemia    Past Surgical History:  Procedure Laterality Date   REPLACEMENT TOTAL KNEE     Current Outpatient Medications on File Prior to Visit  Medication Sig Dispense Refill   atorvastatin (LIPITOR) 40 MG tablet Take 1 tablet (40 mg total) by mouth daily. 90 tablet 3   Dulaglutide (TRULICITY) 1.5 NU/2.7OZ SOPN Inject 1.5 mg into the skin once a week. 4 pen 11   losartan (COZAAR) 50 MG tablet Take 1 tablet (50 mg total) by mouth daily. 90 tablet 3   meloxicam (MOBIC) 15 MG tablet TAKE 1  TABLET BY MOUTH EVERY DAY 30 tablet 3   metFORMIN (GLUCOPHAGE) 500 MG tablet TAKE 1 TABLET BY MOUTH TWICE A DAY WITH A MEAL 60 tablet 11   omega-3 acid ethyl esters (LOVAZA) 1 g capsule TAKE 2 CAPSULES BY MOUTH 2 (TWO) TIMES DAILY. 120 capsule 5   pioglitazone (ACTOS) 30 MG tablet Take 1 tablet (30 mg total) by mouth daily. 30 tablet 3   sildenafil (VIAGRA) 100 MG tablet Take 0.5-1 tablets (50-100 mg total) by mouth daily as needed for erectile dysfunction. 5 tablet 11   No current facility-administered medications on file prior to visit.    No Known Allergies Social History   Socioeconomic History   Marital status: Married    Spouse name: Not on file   Number of children: Not on file   Years of education: Not on file   Highest education level: Not on file  Occupational History   Not on file  Social Needs   Financial resource strain: Not on file   Food insecurity    Worry: Not on file    Inability: Not on file   Transportation needs    Medical: Not on file    Non-medical: Not on file  Tobacco Use   Smoking status: Former Smoker    Packs/day: 1.50    Types: Cigarettes   Smokeless tobacco: Never Used  Substance and Sexual Activity   Alcohol use: No    Alcohol/week: 0.0 standard drinks    Comment: Very occasional   Drug use: No    Comment: quit one year ago   Sexual activity: Not on file  Lifestyle   Physical activity    Days per week: Not on file    Minutes  per session: Not on file   Stress: Not on file  Relationships   Social connections    Talks on phone: Not on file    Gets together: Not on file    Attends religious service: Not on file    Active member of club or organization: Not on file    Attends meetings of clubs or organizations: Not on file    Relationship status: Not on file   Intimate partner violence    Fear of current or ex partner: Not on file    Emotionally abused: Not on file    Physically abused: Not on file    Forced sexual  activity: Not on file  Other Topics Concern   Not on file  Social History Narrative   Not on file      Review of Systems  All other systems reviewed and are negative.      Objective:   Physical Exam Vitals signs reviewed.  Constitutional:      Appearance: He is obese.  Cardiovascular:     Rate and Rhythm: Normal rate and regular rhythm.     Pulses: Normal pulses.     Heart sounds: Normal heart sounds.  Pulmonary:     Effort: Pulmonary effort is normal.     Breath sounds: Normal breath sounds.  Musculoskeletal:     Right knee: He exhibits decreased range of motion, swelling, effusion and abnormal meniscus. Tenderness found. Medial joint line tenderness noted. No lateral joint line, no MCL and no LCL tenderness noted.  Neurological:     Mental Status: He is alert.           Assessment & Plan:  1. Controlled diabetes mellitus type 2 with complications, unspecified whether long term insulin use (HCC) I am very happy with the patient's hemoglobin A1c.  His diabetes is now well controlled.  I would like to recheck his A1c in 6 months.  Recommended diet exercise and weight loss. - Hemoglobin A1c - CBC with Differential/Platelet - COMPLETE METABOLIC PANEL WITH GFR - Lipid panel - Microalbumin, urine  2. Hypertriglyceridemia Triglycerides are now well controlled and the LDL cholesterol is well below his goal of 100.  HDL cholesterol has also risen now to a safer level  3. Hypertension, unspecified type Pressures well controlled  4. Class 3 severe obesity without serious comorbidity with body mass index (BMI) of 40.0 to 44.9 in adult, unspecified obesity type ([place]) Recommended exercise and weight loss.  5. Acute pain of right knee Recommended an MRI for presumed meniscal tear.  Patient is unable to forward an MRI but would like to see an orthopedic surgeon for second opinion - Ambulatory referral to Orthopedic Surgery

## 2019-01-22 ENCOUNTER — Ambulatory Visit: Payer: BLUE CROSS/BLUE SHIELD | Admitting: Family Medicine

## 2019-01-26 ENCOUNTER — Other Ambulatory Visit: Payer: Self-pay | Admitting: Family Medicine

## 2019-01-26 DIAGNOSIS — E118 Type 2 diabetes mellitus with unspecified complications: Secondary | ICD-10-CM

## 2019-02-02 IMAGING — CR DG CHEST 2V
2 series · 2 of 2 positions shown · non-contrast
Comparison: 06/01/2010

CLINICAL DATA: Shortness of breath for several days

EXAM:
CHEST  2 VIEW

[w chest pa]
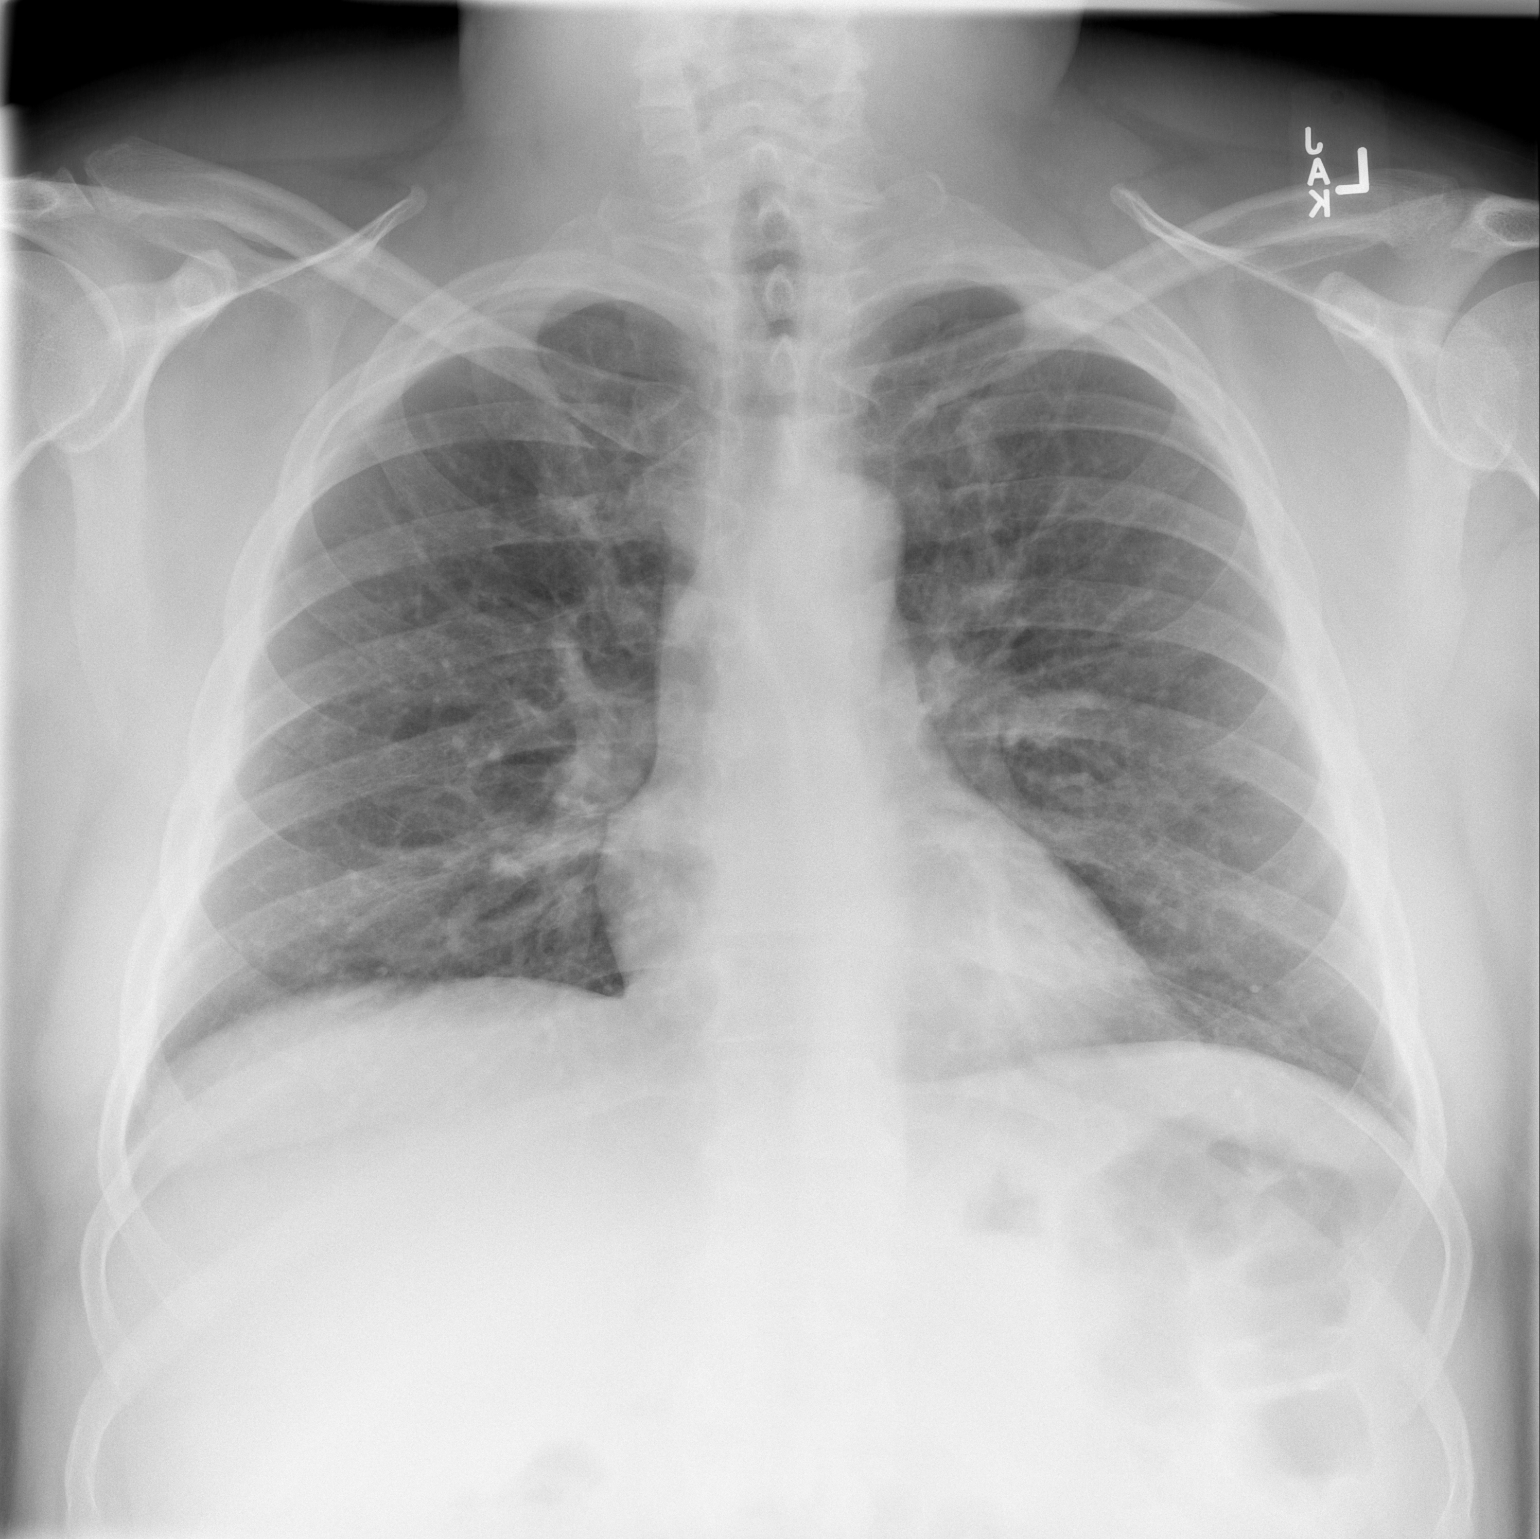

[w chest lat]
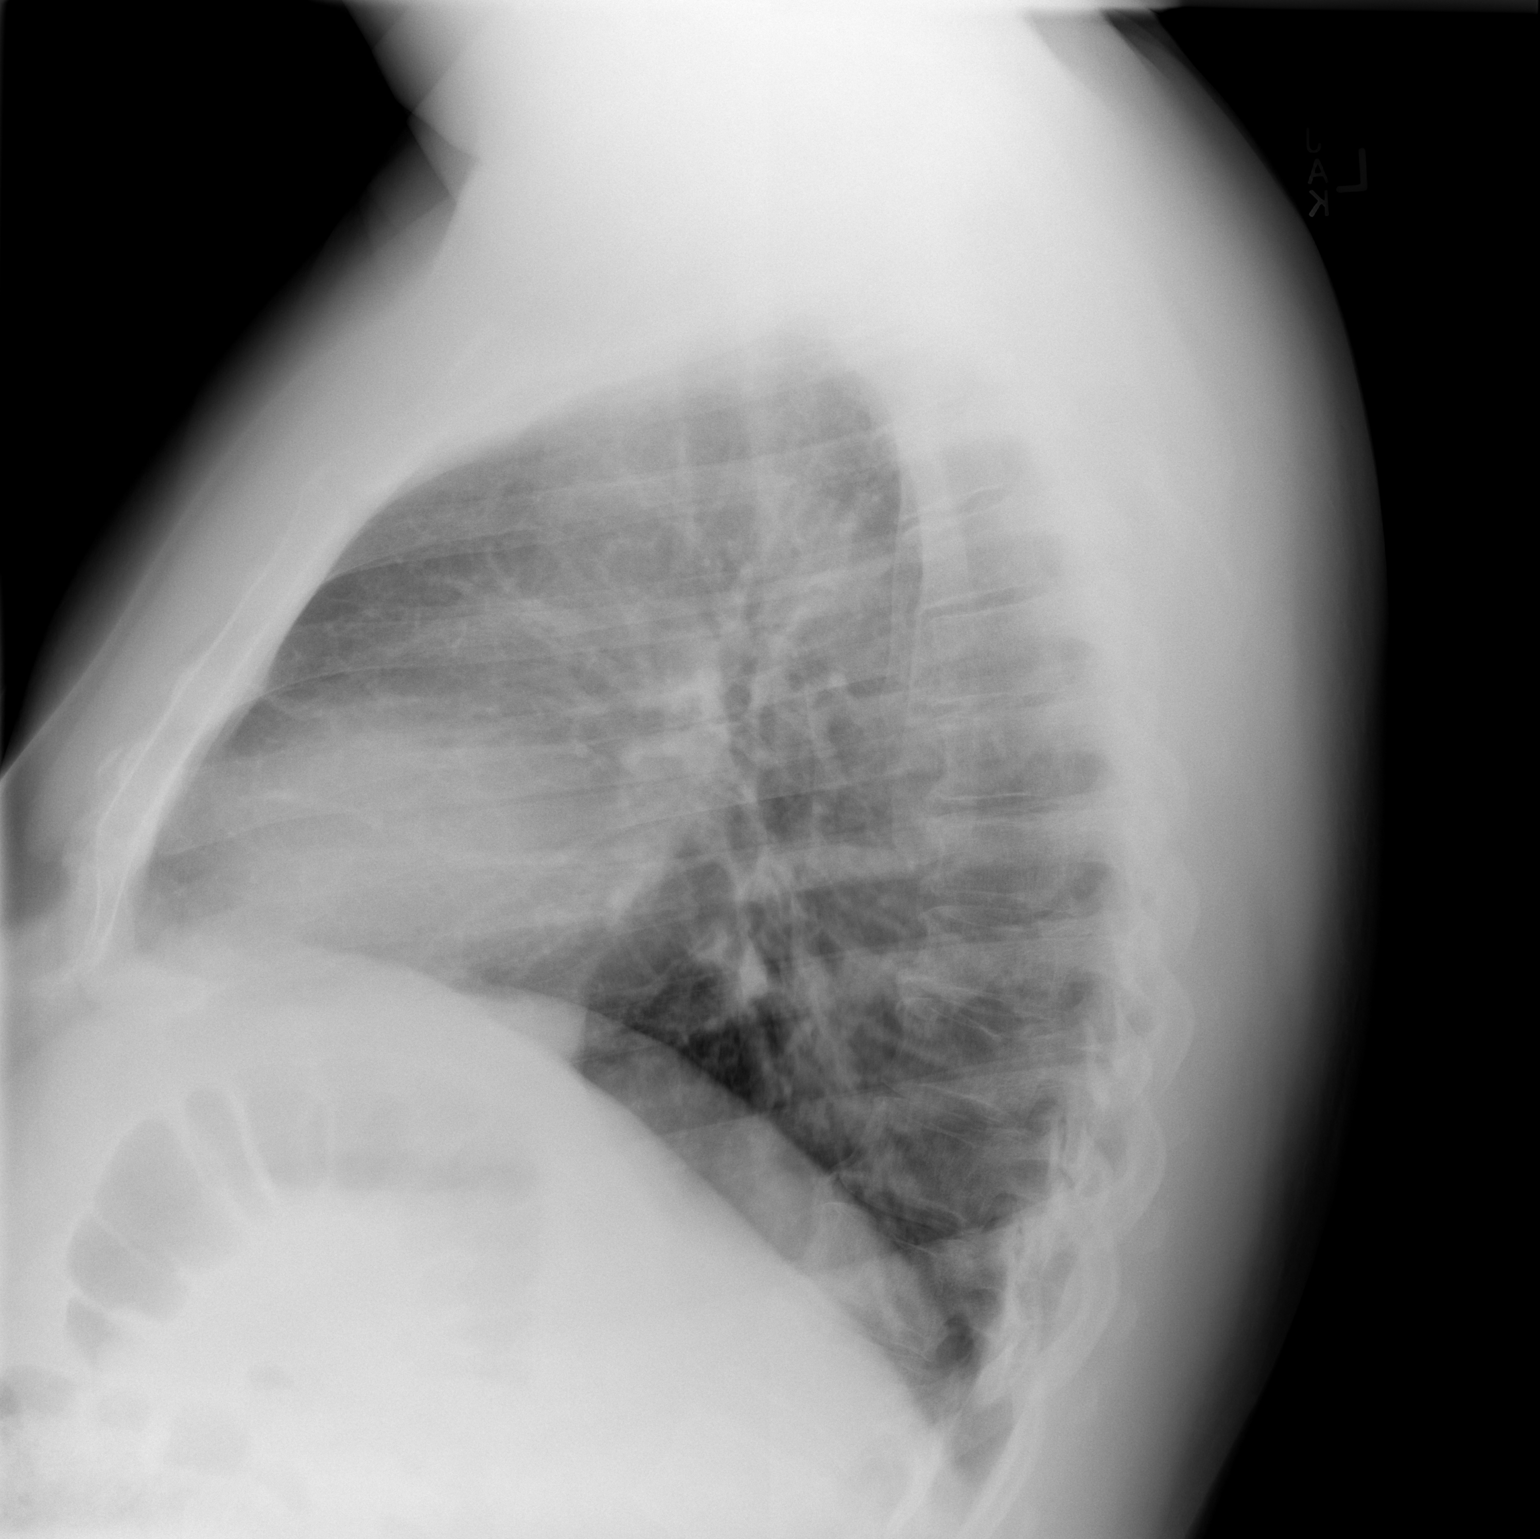

[2 of 2 positions shown; findings below may reference images not displayed]

FINDINGS: The heart size and mediastinal contours are within normal limits.
Both lungs are clear. The visualized skeletal structures are
unremarkable.
IMPRESSION: No active cardiopulmonary disease.

## 2019-02-05 ENCOUNTER — Encounter: Payer: Self-pay | Admitting: Orthopaedic Surgery

## 2019-02-05 ENCOUNTER — Other Ambulatory Visit: Payer: Self-pay

## 2019-02-05 ENCOUNTER — Ambulatory Visit (INDEPENDENT_AMBULATORY_CARE_PROVIDER_SITE_OTHER): Payer: BC Managed Care – PPO | Admitting: Orthopaedic Surgery

## 2019-02-05 VITALS — BP 121/78 | HR 74 | Temp 97.9°F | Ht 70.0 in | Wt 292.0 lb

## 2019-02-05 DIAGNOSIS — M25561 Pain in right knee: Secondary | ICD-10-CM | POA: Diagnosis not present

## 2019-02-05 DIAGNOSIS — E669 Obesity, unspecified: Secondary | ICD-10-CM

## 2019-02-05 DIAGNOSIS — Z6841 Body Mass Index (BMI) 40.0 and over, adult: Secondary | ICD-10-CM

## 2019-02-05 DIAGNOSIS — G8929 Other chronic pain: Secondary | ICD-10-CM | POA: Diagnosis not present

## 2019-02-05 DIAGNOSIS — E1169 Type 2 diabetes mellitus with other specified complication: Secondary | ICD-10-CM | POA: Diagnosis not present

## 2019-02-05 NOTE — Progress Notes (Signed)
Subjective:    Patient ID: Cody Curry, male    DOB: [birth year], 48 y.o.   MRN: [id number]  HPI He has had pain and swelling in the right knee over the last three months.  He has been seen by Dr. Dennard Schaumann at [facility].  I have reviewed the notes.  The patient has swelling and giving way.  He has taken Mobic with limited success.  He has had injection which did not help.  It was mentioned to have a MRI but he has a very high deductible and is not wanting to do that.  His giving way is getting worse.  He is not getting better.  He has no redness.  He has no distal edema.  He is a diabetic but his A1C is 6.4.  He is trying to lose weight.  He has been doing it slowly.  He is post infection of the left knee and post total knee by Dr. Collie Siad years ago.  He has no problem with the left knee.   Review of Systems  Constitutional: Positive for activity change.  Musculoskeletal: Positive for arthralgias, gait problem and joint swelling.  All other systems reviewed and are negative.  For Review of Systems, all other systems reviewed and are negative.  The following is a summary of the past history medically, past history surgically, known current medicines, social history and family history.  This information is gathered electronically by the computer from prior information and documentation.  I review this each visit and have found including this information at this point in the chart is beneficial and informative.   Past Medical History:  Diagnosis Date  . Diabetes mellitus type 2 in obese ([place])   . Diabetic ketoacidosis without coma associated with type 2 diabetes mellitus ([place])   . DKA, type 2 ([place]) 03/28/2017  . Hypertriglyceridemia     Past Surgical History:  Procedure Laterality Date  . REPLACEMENT TOTAL KNEE      Current Outpatient Medications on File Prior to Visit  Medication Sig Dispense Refill  . atorvastatin (LIPITOR) 40 MG tablet Take 1 tablet (40 mg total)  by mouth daily. 90 tablet 3  . Dulaglutide (TRULICITY) 1.5 HD/6.2IW SOPN Inject 1.5 mg into the skin once a week. 4 pen 11  . losartan (COZAAR) 50 MG tablet Take 1 tablet (50 mg total) by mouth daily. 90 tablet 3  . meloxicam (MOBIC) 15 MG tablet TAKE 1 TABLET BY MOUTH EVERY DAY 30 tablet 3  . metFORMIN (GLUCOPHAGE) 500 MG tablet TAKE 1 TABLET BY MOUTH TWICE A DAY WITH A MEAL 60 tablet 11  . omega-3 acid ethyl esters (LOVAZA) 1 g capsule TAKE 2 CAPSULES BY MOUTH 2 (TWO) TIMES DAILY. 120 capsule 5  . pioglitazone (ACTOS) 30 MG tablet TAKE 1 TABLET BY MOUTH EVERY DAY 90 tablet 1  . sildenafil (VIAGRA) 100 MG tablet Take 0.5-1 tablets (50-100 mg total) by mouth daily as needed for erectile dysfunction. 5 tablet 11   No current facility-administered medications on file prior to visit.     Social History   Socioeconomic History  . Marital status: Single    Spouse name: Not on file  . Number of children: Not on file  . Years of education: Not on file  . Highest education level: Not on file  Occupational History  . Not on file  Social Needs  . Financial resource strain: Not on file  . Food insecurity    Worry: Not  on file    Inability: Not on file  . Transportation needs    Medical: Not on file    Non-medical: Not on file  Tobacco Use  . Smoking status: Former Smoker    Packs/day: 1.50    Types: Cigarettes  . Smokeless tobacco: Never Used  Substance and Sexual Activity  . Alcohol use: No    Alcohol/week: 0.0 standard drinks    Comment: Very occasional  . Drug use: No    Comment: quit one year ago  . Sexual activity: Not on file  Lifestyle  . Physical activity    Days per week: Not on file    Minutes per session: Not on file  . Stress: Not on file  Relationships  . Social Herbalist on phone: Not on file    Gets together: Not on file    Attends religious service: Not on file    Active member of club or organization: Not on file    Attends meetings of clubs or  organizations: Not on file    Relationship status: Not on file  . Intimate partner violence    Fear of current or ex partner: Not on file    Emotionally abused: Not on file    Physically abused: Not on file    Forced sexual activity: Not on file  Other Topics Concern  . Not on file  Social History Narrative  . Not on file    Family History  Problem Relation Age of Onset  . COPD Mother   . Stroke Mother   . Hyperlipidemia Father   . Hypertension Father   . Cancer Father        bladder  . Heart disease Father   . COPD Father     BP 121/78   Pulse 74   Temp 97.9 F (36.6 C)   Ht 5\' 10"  (1.778 m)   Wt 292 lb (132.5 kg)   BMI 41.90 kg/m   Body mass index is 41.9 kg/m.  The patient meets the AMA guidelines for Morbid (severe) obesity with a BMI > 40.0 and I have recommended weight loss.       Objective:   Physical Exam Vitals signs reviewed.  Constitutional:      Appearance: He is well-developed.  HENT:     Head: Normocephalic and atraumatic.  Eyes:     Conjunctiva/sclera: Conjunctivae normal.     Pupils: Pupils are equal, round, and reactive to light.  Neck:     Musculoskeletal: Normal range of motion and neck supple.  Cardiovascular:     Rate and Rhythm: Normal rate and regular rhythm.  Pulmonary:     Effort: Pulmonary effort is normal.  Abdominal:     Palpations: Abdomen is soft.  Musculoskeletal:     Right knee: He exhibits decreased range of motion and effusion. Tenderness found. Medial joint line tenderness noted.       Legs:  Skin:    General: Skin is warm and dry.  Neurological:     Mental Status: He is alert and oriented to person, place, and time.     Cranial Nerves: No cranial nerve deficit.     Motor: No abnormal muscle tone.     Coordination: Coordination normal.     Deep Tendon Reflexes: Reflexes are normal and symmetric. Reflexes normal.  Psychiatric:        Behavior: Behavior normal.        Thought Content: Thought content normal.  Judgment: Judgment normal.           Assessment & Plan:   Encounter Diagnoses  Name Primary?  . Chronic pain of right knee Yes  . Body mass index 40.0-44.9, adult ([place])   . Morbid obesity ([place])   . Diabetes mellitus type 2 in obese ([place])    I will inject the knee today and see if it helps.  He will check with his insurance and the hospital about costs for MRI.  I am concerned he has a meniscus tear and will need arthroscopy.  PROCEDURE NOTE:  The patient request injection, verbal consent was obtained.  The right knee was prepped appropriately after time out was performed.   Sterile technique was observed and anesthesia was provided by ethyl chloride and a 20-gauge needle was used to inject the knee area.  A 16-gauge needle was then used to aspirate the knee.  Color of fluid aspirated was straw  Total cc's aspirated was 42.    Injection of 1 cc of Depo-Medrol 40 mg with several cc's of plain xylocaine was then performed.  A band aid dressing was applied.  The patient was advised to apply ice later today and tomorrow to the injection sight as needed.  I will begin Naprosyn. I will give sample of Aleve.  Return in two weeks.  Call if any problem.  Precautions discussed.   Electronically Signed Sanjuana Kava, MD 7/28/202012:08 PM

## 2019-02-07 ENCOUNTER — Encounter: Payer: Self-pay | Admitting: Family Medicine

## 2019-02-07 ENCOUNTER — Other Ambulatory Visit: Payer: Self-pay

## 2019-02-07 ENCOUNTER — Ambulatory Visit (INDEPENDENT_AMBULATORY_CARE_PROVIDER_SITE_OTHER): Payer: BC Managed Care – PPO | Admitting: Family Medicine

## 2019-02-07 VITALS — BP 118/68 | HR 88 | Temp 98.4°F | Resp 16 | Ht 70.0 in | Wt 292.0 lb

## 2019-02-07 DIAGNOSIS — E118 Type 2 diabetes mellitus with unspecified complications: Secondary | ICD-10-CM | POA: Diagnosis not present

## 2019-02-07 NOTE — Progress Notes (Signed)
Subjective:    Patient ID: Cody Curry, male    DOB: August 18, 1970, 48 y.o.   MRN: [id number]  HPI  11/19/18 Patient is being seen with a one-month history of pain and stiffness in his right knee.  The pain and stiffness is located in the popliteal fossa primarily on the lateral aspect.  Patient has swelling behind the knee consistent with a Baker's cyst.  He also has a positive Apley grind causing pain on the lateral aspect of his knee.  There is no laxity to varus or valgus stress.  He has a negative anterior and posterior drawer sign.  There is no significant anterior effusion.  However he does have pain with flexion greater than 90 degrees.  The patient has full extension.  He denies any significant injury.  He does not recall when the pain started but is been gradually getting worse over the last month.  He has not taken any regular anti-inflammatory for this yet.  At that time, my plan was: I believe the patient has stiffness and swelling behind his right knee due to a Baker's cyst most likely due to a lateral meniscus tear.  Also suspect some underlying osteoarthritis.  We will start the patient on meloxicam 15 mg a day and see how this does.  If no improvement is seen over the next 2 weeks the next that would be a cortisone injection and/or consideration for an MRI of the knee to evaluate further.  Patient will notify me via telephone in 1 to 2 weeks to give me an update on how the pain is doing on the meloxicam.  He is due to recheck a hemoglobin A1c in July.  12/20/18 Pain is no better.  Knee is swollen with a small Baker's cyst.  Now having medial joint line pain and pain in the posterolateral joint line. At that time, my plan was: 1. Acute pain of right knee  2. Baker cyst, right  Suspect meniscal tear vs arthritis.  Using sterile technique, injected 2 cc of lidocaine, 2 cc of marcaine, and 2 cc of 40 mg/ml kenalog.  Tolerated procedure well. If no better, recommend mri to evaluate for  meniscal tear.     01/14/19 Patient is here today for follow-up.  He saw no benefit from the cortisone injection in his right knee.  He continues to report periodic effusions with prolonged standing.  He continues to have pain over the medial joint line however the majority of his pain is anterior rather than posterior.  He continues to have an occasional Baker's cyst that seems to wax and wane depending upon his level of activity.  His primary reason for being here today is a follow-up of his diabetes.  In March he was started on Actos.  At that time his hemoglobin A1c was 7.2.  It has since fallen to 6.4!.  His LDL cholesterol has dropped approximately 10 points and is in the 80s.  His triglycerides fell over 100 points and is now well controlled.  His HDL cholesterol also has risen approximately 10 points.  Please see the lab work below.  Patient has gained 4 pounds but this does not appear to be due to the Actos as he has no pitting edema.  The majority of his weight gain has come from inactivity and diet due to his knee pain.  He denies any hypoglycemia.  He denies any chest pain or shortness of breath or dyspnea on exertion.  At that time, my  plan was: 1. Controlled diabetes mellitus type 2 with complications, unspecified whether long term insulin use (HCC) I am very happy with the patient's hemoglobin A1c.  His diabetes is now well controlled.  I would like to recheck his A1c in 6 months.  Recommended diet exercise and weight loss. - Hemoglobin A1c - CBC with Differential/Platelet - COMPLETE METABOLIC PANEL WITH GFR - Lipid panel - Microalbumin, urine  2. Hypertriglyceridemia Triglycerides are now well controlled and the LDL cholesterol is well below his goal of 100.  HDL cholesterol has also risen now to a safer level  3. Hypertension, unspecified type Pressures well controlled  4. Class 3 severe obesity without serious comorbidity with body mass index (BMI) of 40.0 to 44.9 in adult,  unspecified obesity type ([place]) Recommended exercise and weight loss.  5. Acute pain of right knee Recommended an MRI for presumed meniscal tear.  Patient is unable to forward an MRI but would like to see an orthopedic surgeon for second opinion - Ambulatory referral to Orthopedic Surgery  02/07/19 Patient is here today for a diabetic foot exam and for referral for a diabetic eye exam.  He does have some numbness and tingling on the lateral aspect of the plantar aspect of his left foot.  He has a callus formation on the medial first MTP joint of the right foot.  He has diminished sensation to 10 g monofilament on the tips of the second third and fourth toes on the right foot and on all the toes of the left foot.  He has strong 2/4 dorsalis pedis and posterior tibialis pulses in both feet.  There are varicose veins prominent on the anterior shins of both legs. No visits with results within 2 Week(s) from this visit.  Latest known visit with results is:  Appointment on 01/03/2019  Component Date Value Ref Range Status  . WBC 01/03/2019 10.5  3.8 - 10.8 Thousand/uL Final  . RBC 01/03/2019 4.81  4.20 - 5.80 Million/uL Final  . Hemoglobin 01/03/2019 14.9  13.2 - 17.1 g/dL Final  . HCT 01/03/2019 44.0  38.5 - 50.0 % Final  . MCV 01/03/2019 91.5  80.0 - 100.0 fL Final  . MCH 01/03/2019 31.0  27.0 - 33.0 pg Final  . MCHC 01/03/2019 33.9  32.0 - 36.0 g/dL Final  . RDW 01/03/2019 12.6  11.0 - 15.0 % Final  . Platelets 01/03/2019 201  140 - 400 Thousand/uL Final  . MPV 01/03/2019 10.2  7.5 - 12.5 fL Final  . Neutro Abs 01/03/2019 6,615  1,500 - 7,800 cells/uL Final  . Lymphs Abs 01/03/2019 2,940  850 - 3,900 cells/uL Final  . Absolute Monocytes 01/03/2019 798  200 - 950 cells/uL Final  . Eosinophils Absolute 01/03/2019 95  15 - 500 cells/uL Final  . Basophils Absolute 01/03/2019 53  0 - 200 cells/uL Final  . Neutrophils Relative % 01/03/2019 63  % Final  . Total Lymphocyte 01/03/2019 28.0  % Final   . Monocytes Relative 01/03/2019 7.6  % Final  . Eosinophils Relative 01/03/2019 0.9  % Final  . Basophils Relative 01/03/2019 0.5  % Final  . Glucose, Bld 01/03/2019 101* 65 - 99 mg/dL Final   Comment: .            Fasting reference interval . For someone without known diabetes, a glucose value between 100 and 125 mg/dL is consistent with prediabetes and should be confirmed with a follow-up test. .   . BUN 01/03/2019 22  7 -  25 mg/dL Final  . Creat 01/03/2019 1.03  0.60 - 1.35 mg/dL Final  . BUN/Creatinine Ratio 38/18/2993 NOT APPLICABLE  6 - 22 (calc) Final  . Sodium 01/03/2019 138  135 - 146 mmol/L Final  . Potassium 01/03/2019 4.6  3.5 - 5.3 mmol/L Final  . Chloride 01/03/2019 104  98 - 110 mmol/L Final  . CO2 01/03/2019 25  20 - 32 mmol/L Final  . Calcium 01/03/2019 9.2  8.6 - 10.3 mg/dL Final  . Total Protein 01/03/2019 7.3  6.1 - 8.1 g/dL Final  . Albumin 01/03/2019 4.5  3.6 - 5.1 g/dL Final  . Globulin 01/03/2019 2.8  1.9 - 3.7 g/dL (calc) Final  . AG Ratio 01/03/2019 1.6  1.0 - 2.5 (calc) Final  . Total Bilirubin 01/03/2019 0.4  0.2 - 1.2 mg/dL Final  . Alkaline phosphatase (APISO) 01/03/2019 79  36 - 130 U/L Final  . AST 01/03/2019 17  10 - 40 U/L Final  . ALT 01/03/2019 15  9 - 46 U/L Final  . Hgb A1c MFr Bld 01/03/2019 6.4* <5.7 % of total Hgb Final   Comment: For someone without known diabetes, a hemoglobin  A1c value between 5.7% and 6.4% is consistent with prediabetes and should be confirmed with a  follow-up test. . For someone with known diabetes, a value <7% indicates that their diabetes is well controlled. A1c targets should be individualized based on duration of diabetes, age, comorbid conditions, and other considerations. . This assay result is consistent with an increased risk of diabetes. . Currently, no consensus exists regarding use of hemoglobin A1c for diagnosis of diabetes for children. .   . Mean Plasma Glucose 01/03/2019 137  (calc) Final   . eAG (mmol/L) 01/03/2019 7.6  (calc) Final  . Cholesterol 01/03/2019 157  <200 mg/dL Final  . HDL 01/03/2019 54  > OR = 40 mg/dL Final  . Triglycerides 01/03/2019 115  <150 mg/dL Final  . LDL Cholesterol (Calc) 01/03/2019 82  mg/dL (calc) Final   Comment: Reference range: <100 . Desirable range <100 mg/dL for primary prevention;   <70 mg/dL for patients with CHD or diabetic patients  with > or = 2 CHD risk factors. Marland Kitchen LDL-C is now calculated using the Martin-Hopkins  calculation, which is a validated novel method providing  better accuracy than the Friedewald equation in the  estimation of LDL-C.  Cresenciano Genre et al. Annamaria Helling. 7169;678(93): 2061-2068  (http://education.[facility].com/faq/FAQ164)   . Total CHOL/HDL Ratio 01/03/2019 2.9  <5.0 (calc) Final  . Non-HDL Cholesterol (Calc) 01/03/2019 103  <130 mg/dL (calc) Final   Comment: For patients with diabetes plus 1 major ASCVD risk  factor, treating to a non-HDL-C goal of <100 mg/dL  (LDL-C of <70 mg/dL) is considered a therapeutic  option.     Past Medical History:  Diagnosis Date  . Diabetes mellitus type 2 in obese ([place])   . Diabetic ketoacidosis without coma associated with type 2 diabetes mellitus ([place])   . DKA, type 2 ([place]) 03/28/2017  . Hypertriglyceridemia    Past Surgical History:  Procedure Laterality Date  . REPLACEMENT TOTAL KNEE     Current Outpatient Medications on File Prior to Visit  Medication Sig Dispense Refill  . atorvastatin (LIPITOR) 40 MG tablet Take 1 tablet (40 mg total) by mouth daily. 90 tablet 3  . Dulaglutide (TRULICITY) 1.5 YB/0.1BP SOPN Inject 1.5 mg into the skin once a week. 4 pen 11  . losartan (COZAAR) 50 MG tablet Take 1 tablet (50 mg  total) by mouth daily. 90 tablet 3  . meloxicam (MOBIC) 15 MG tablet TAKE 1 TABLET BY MOUTH EVERY DAY 30 tablet 3  . metFORMIN (GLUCOPHAGE) 500 MG tablet TAKE 1 TABLET BY MOUTH TWICE A DAY WITH A MEAL 60 tablet 11  . omega-3 acid ethyl esters (LOVAZA) 1 g  capsule TAKE 2 CAPSULES BY MOUTH 2 (TWO) TIMES DAILY. 120 capsule 5  . pioglitazone (ACTOS) 30 MG tablet TAKE 1 TABLET BY MOUTH EVERY DAY 90 tablet 1  . sildenafil (VIAGRA) 100 MG tablet Take 0.5-1 tablets (50-100 mg total) by mouth daily as needed for erectile dysfunction. 5 tablet 11   No current facility-administered medications on file prior to visit.    No Known Allergies Social History   Socioeconomic History  . Marital status: Single    Spouse name: Not on file  . Number of children: Not on file  . Years of education: Not on file  . Highest education level: Not on file  Occupational History  . Not on file  Social Needs  . Financial resource strain: Not on file  . Food insecurity    Worry: Not on file    Inability: Not on file  . Transportation needs    Medical: Not on file    Non-medical: Not on file  Tobacco Use  . Smoking status: Former Smoker    Packs/day: 1.50    Types: Cigarettes  . Smokeless tobacco: Never Used  Substance and Sexual Activity  . Alcohol use: No    Alcohol/week: 0.0 standard drinks    Comment: Very occasional  . Drug use: No    Comment: quit one year ago  . Sexual activity: Not on file  Lifestyle  . Physical activity    Days per week: Not on file    Minutes per session: Not on file  . Stress: Not on file  Relationships  . Social Herbalist on phone: Not on file    Gets together: Not on file    Attends religious service: Not on file    Active member of club or organization: Not on file    Attends meetings of clubs or organizations: Not on file    Relationship status: Not on file  . Intimate partner violence    Fear of current or ex partner: Not on file    Emotionally abused: Not on file    Physically abused: Not on file    Forced sexual activity: Not on file  Other Topics Concern  . Not on file  Social History Narrative  . Not on file      Review of Systems  All other systems reviewed and are negative.       Objective:   Physical Exam Vitals signs reviewed.  Constitutional:      Appearance: He is obese.  Cardiovascular:     Rate and Rhythm: Normal rate and regular rhythm.     Pulses: Normal pulses.     Heart sounds: Normal heart sounds.  Pulmonary:     Effort: Pulmonary effort is normal.     Breath sounds: Normal breath sounds.  Neurological:     Mental Status: He is alert.           Assessment & Plan:  The encounter diagnosis was Controlled diabetes mellitus type 2 with complications, unspecified whether long term insulin use ([place]). Diabetic foot exam is abnormal and does suggest mild underlying neuropathy particularly in the distal toes.  He has normal  pulses in both feet.  Recommended daily foot checks to evaluate for injuries that could become infected.  Also recommended that he use an emery board to file down the thick callus skin on the medial surface of the right first MTP joint and apply Vaseline to keep the skin soft and supple to prevent ulcer formation.  Refer for ophthalmology.

## 2019-02-08 ENCOUNTER — Other Ambulatory Visit: Payer: Self-pay | Admitting: Family Medicine

## 2019-02-14 ENCOUNTER — Other Ambulatory Visit: Payer: Self-pay | Admitting: Family Medicine

## 2019-02-14 DIAGNOSIS — Z794 Long term (current) use of insulin: Secondary | ICD-10-CM

## 2019-02-14 DIAGNOSIS — E118 Type 2 diabetes mellitus with unspecified complications: Secondary | ICD-10-CM

## 2019-02-14 MED ORDER — LOSARTAN POTASSIUM 50 MG PO TABS: 50.0000 mg | ORAL_TABLET | Freq: Every day | ORAL | 3 refills | Status: DC

## 2019-02-19 ENCOUNTER — Encounter: Payer: Self-pay | Admitting: Orthopaedic Surgery

## 2019-02-19 ENCOUNTER — Other Ambulatory Visit: Payer: Self-pay

## 2019-02-19 ENCOUNTER — Ambulatory Visit: Payer: BC Managed Care – PPO | Admitting: Orthopaedic Surgery

## 2019-02-19 VITALS — BP 134/91 | HR 72 | Temp 98.4°F | Ht 70.0 in | Wt 296.0 lb

## 2019-02-19 DIAGNOSIS — Z6841 Body Mass Index (BMI) 40.0 and over, adult: Secondary | ICD-10-CM | POA: Diagnosis not present

## 2019-02-19 DIAGNOSIS — E1169 Type 2 diabetes mellitus with other specified complication: Secondary | ICD-10-CM | POA: Diagnosis not present

## 2019-02-19 DIAGNOSIS — M25561 Pain in right knee: Secondary | ICD-10-CM

## 2019-02-19 DIAGNOSIS — G8929 Other chronic pain: Secondary | ICD-10-CM

## 2019-02-19 DIAGNOSIS — E669 Obesity, unspecified: Secondary | ICD-10-CM

## 2019-02-19 NOTE — Progress Notes (Signed)
Patient Cody Curry, male DOB:Dec 03, 1970, 48 y.o. GDJ:[id number]  Chief Complaint  Patient presents with  . Knee Pain    R/better today/wkend was swollen and stiff    HPI  Cody Curry is a 48 y.o. male who has right knee pain.  He has less swelling and less pain.  He still has some swelling.  He is a little better and walking better but has pain after a long day.  He has no new trauma.  He prefers to come as needed.  He is checking on MRI prices.   Body mass index is 42.47 kg/m.  The patient meets the AMA guidelines for Morbid (severe) obesity with a BMI > 40.0 and I have recommended weight loss.   ROS  Review of Systems  Constitutional: Positive for activity change.  Musculoskeletal: Positive for arthralgias, gait problem and joint swelling.  All other systems reviewed and are negative.   All other systems reviewed and are negative.  The following is a summary of the past history medically, past history surgically, known current medicines, social history and family history.  This information is gathered electronically by the computer from prior information and documentation.  I review this each visit and have found including this information at this point in the chart is beneficial and informative.    Past Medical History:  Diagnosis Date  . Diabetes mellitus type 2 in obese ([place])   . Diabetic ketoacidosis without coma associated with type 2 diabetes mellitus ([place])   . DKA, type 2 ([place]) 03/28/2017  . Hypertriglyceridemia     Past Surgical History:  Procedure Laterality Date  . REPLACEMENT TOTAL KNEE      Family History  Problem Relation Age of Onset  . COPD Mother   . Stroke Mother   . Hyperlipidemia Father   . Hypertension Father   . Cancer Father        bladder  . Heart disease Father   . COPD Father     Social History Social History   Tobacco Use  . Smoking status: Former Smoker    Packs/day: 1.50    Types: Cigarettes  . Smokeless tobacco:  Never Used  Substance Use Topics  . Alcohol use: No    Alcohol/week: 0.0 standard drinks    Comment: Very occasional  . Drug use: No    Comment: quit one year ago    No Known Allergies  Current Outpatient Medications  Medication Sig Dispense Refill  . atorvastatin (LIPITOR) 40 MG tablet Take 1 tablet (40 mg total) by mouth daily. 90 tablet 3  . Dulaglutide (TRULICITY) 1.5 QQ/2.2LN SOPN Inject 1.5 mg into the skin once a week. 4 pen 11  . losartan (COZAAR) 50 MG tablet Take 1 tablet (50 mg total) by mouth daily. 90 tablet 3  . meloxicam (MOBIC) 15 MG tablet TAKE 1 TABLET BY MOUTH EVERY DAY (Patient not taking: Reported on 02/19/2019) 30 tablet 3  . metFORMIN (GLUCOPHAGE) 500 MG tablet TAKE 1 TABLET BY MOUTH TWICE A DAY WITH A MEAL 180 tablet 3  . omega-3 acid ethyl esters (LOVAZA) 1 g capsule TAKE 2 CAPSULES BY MOUTH 2 (TWO) TIMES DAILY. 120 capsule 5  . pioglitazone (ACTOS) 30 MG tablet TAKE 1 TABLET BY MOUTH EVERY DAY 90 tablet 1  . sildenafil (VIAGRA) 100 MG tablet Take 0.5-1 tablets (50-100 mg total) by mouth daily as needed for erectile dysfunction. 5 tablet 11   No current facility-administered medications for this visit.  Physical Exam  Blood pressure (!) 134/91, pulse 72, temperature 98.4 F (36.9 C), height 5\' 10"  (1.778 m), weight 296 lb (134.3 kg).  Constitutional: overall normal hygiene, normal nutrition, well developed, normal grooming, normal body habitus. Assistive device:none  Musculoskeletal: gait and station Limp right, muscle tone and strength are normal, no tremors or atrophy is present.  .  Neurological: coordination overall normal.  Deep tendon reflex/nerve stretch intact.  Sensation normal.  Cranial nerves II-XII intact.   Skin:   Normal overall no scars, lesions, ulcers or rashes. No psoriasis.  Psychiatric: Alert and oriented x 3.  Recent memory intact, remote memory unclear.  Normal mood and affect. Well groomed.  Good eye  contact.  Cardiovascular: overall no swelling, no varicosities, no edema bilaterally, normal temperatures of the legs and arms, no clubbing, cyanosis and good capillary refill.  Lymphatic: palpation is normal.  Right knee has slight effusion, crepitus, ROM 0 to 110, slight limp right.  NV intact. All other systems reviewed and are negative   The patient has been educated about the nature of the problem(s) and counseled on treatment options.  The patient appeared to understand what I have discussed and is in agreement with it.  Encounter Diagnoses  Name Primary?  . Chronic pain of right knee Yes  . Body mass index 40.0-44.9, adult ([place])   . Morbid obesity ([place])   . Diabetes mellitus type 2 in obese [facility])     PLAN Call if any problems.  Precautions discussed.  Continue current medications.   Return to clinic prn   Electronically Signed Sanjuana Kava, MD 8/11/20208:50 AM

## 2019-02-25 ENCOUNTER — Encounter: Payer: Self-pay | Admitting: Family Medicine

## 2019-02-25 ENCOUNTER — Ambulatory Visit (INDEPENDENT_AMBULATORY_CARE_PROVIDER_SITE_OTHER): Payer: BC Managed Care – PPO | Admitting: Family Medicine

## 2019-02-25 ENCOUNTER — Other Ambulatory Visit: Payer: Self-pay

## 2019-02-25 VITALS — BP 130/82 | HR 102 | Temp 98.8°F | Resp 16 | Ht 70.0 in | Wt 290.0 lb

## 2019-02-25 DIAGNOSIS — R768 Other specified abnormal immunological findings in serum: Secondary | ICD-10-CM | POA: Diagnosis not present

## 2019-02-25 DIAGNOSIS — Z202 Contact with and (suspected) exposure to infections with a predominantly sexual mode of transmission: Secondary | ICD-10-CM | POA: Diagnosis not present

## 2019-02-26 LAB — RPR: RPR Ser Ql: NONREACTIVE

## 2019-02-26 NOTE — Progress Notes (Signed)
Subjective:    Patient ID: Cody Curry, male    DOB: [birth year], 48 y.o.   MRN: [id number]  HPI  Patient recently went to donate blood.  Apparently he had a "positive test for syphilis however a confirmatory test was negative".  He denies any history of syphilis.  He denies any history of an ulcer or rash.  He denies any history of sexual partners who have tested positive for syphilis.  He is here today to discuss screening test.  He did not have the specific lab work that was obtained.  Rather we can only go by his report.  Past Medical History:  Diagnosis Date  . Diabetes mellitus type 2 in obese ([place])   . Diabetic ketoacidosis without coma associated with type 2 diabetes mellitus ([place])   . DKA, type 2 ([place]) 03/28/2017  . Hypertriglyceridemia    Past Surgical History:  Procedure Laterality Date  . REPLACEMENT TOTAL KNEE     Current Outpatient Medications on File Prior to Visit  Medication Sig Dispense Refill  . atorvastatin (LIPITOR) 40 MG tablet Take 1 tablet (40 mg total) by mouth daily. 90 tablet 3  . Dulaglutide (TRULICITY) 1.5 QQ/5.9DG SOPN Inject 1.5 mg into the skin once a week. 4 pen 11  . losartan (COZAAR) 50 MG tablet Take 1 tablet (50 mg total) by mouth daily. 90 tablet 3  . metFORMIN (GLUCOPHAGE) 500 MG tablet TAKE 1 TABLET BY MOUTH TWICE A DAY WITH A MEAL 180 tablet 3  . omega-3 acid ethyl esters (LOVAZA) 1 g capsule TAKE 2 CAPSULES BY MOUTH 2 (TWO) TIMES DAILY. 120 capsule 5  . pioglitazone (ACTOS) 30 MG tablet TAKE 1 TABLET BY MOUTH EVERY DAY 90 tablet 1  . sildenafil (VIAGRA) 100 MG tablet Take 0.5-1 tablets (50-100 mg total) by mouth daily as needed for erectile dysfunction. 5 tablet 11   No current facility-administered medications on file prior to visit.    No Known Allergies Social History   Socioeconomic History  . Marital status: Single    Spouse name: Not on file  . Number of children: Not on file  . Years of education: Not on file  . Highest  education level: Not on file  Occupational History  . Not on file  Social Needs  . Financial resource strain: Not on file  . Food insecurity    Worry: Not on file    Inability: Not on file  . Transportation needs    Medical: Not on file    Non-medical: Not on file  Tobacco Use  . Smoking status: Former Smoker    Packs/day: 1.50    Types: Cigarettes  . Smokeless tobacco: Never Used  Substance and Sexual Activity  . Alcohol use: No    Alcohol/week: 0.0 standard drinks    Comment: Very occasional  . Drug use: No    Comment: quit one year ago  . Sexual activity: Not on file  Lifestyle  . Physical activity    Days per week: Not on file    Minutes per session: Not on file  . Stress: Not on file  Relationships  . Social Herbalist on phone: Not on file    Gets together: Not on file    Attends religious service: Not on file    Active member of club or organization: Not on file    Attends meetings of clubs or organizations: Not on file    Relationship status: Not on file  .  Intimate partner violence    Fear of current or ex partner: Not on file    Emotionally abused: Not on file    Physically abused: Not on file    Forced sexual activity: Not on file  Other Topics Concern  . Not on file  Social History Narrative  . Not on file      Review of Systems  All other systems reviewed and are negative.      Objective:   Physical Exam Vitals signs reviewed.  Constitutional:      Appearance: He is obese.  Cardiovascular:     Rate and Rhythm: Normal rate and regular rhythm.     Pulses: Normal pulses.     Heart sounds: Normal heart sounds.  Pulmonary:     Effort: Pulmonary effort is normal.     Breath sounds: Normal breath sounds.  Neurological:     Mental Status: He is alert.   Patient denies any genital rash or lesions        Assessment & Plan:  The encounter diagnosis was False positive serological test for syphilis. I will begin by obtaining a  non-treponemal test, an RPR.  If positive, I will obtain a treponemal specific test FTA-ABS.  If both of these are positive the patient will be diagnosed with latent syphilis and treated accordingly.  However I suspect that his RPR will be negative.  I suspect that the patient has a false positive test given his lack of history.

## 2019-06-12 ENCOUNTER — Other Ambulatory Visit: Payer: Self-pay

## 2019-06-12 DIAGNOSIS — Z20822 Contact with and (suspected) exposure to covid-19: Secondary | ICD-10-CM

## 2019-06-14 LAB — NOVEL CORONAVIRUS, NAA: SARS-CoV-2, NAA: DETECTED — AB

## 2019-06-15 ENCOUNTER — Telehealth: Payer: Self-pay | Admitting: Unknown Physician Specialty

## 2019-06-15 NOTE — Telephone Encounter (Signed)
Discussed with patient about Covid symptoms and the use of bamlanivimab, a monoclonal antibody infusion for those with mild to moderate Covid symptoms and at a high risk of hospitalization.  Pt is qualified for this infusion at the [facility] infusion center due to diabetes which were addressed with the patient and are actively being managed by a [facility] provider.    After discussing the infusion's costs, potential benefits and side effects, the patient has decided to accept treatment with monoclonal antibodies but concerned with cost

## 2019-06-15 NOTE — Telephone Encounter (Signed)
Scheduled for Tuesday 12/8 at [place].  He will contact [facility] Monday to verify payment for administration.

## 2019-06-17 ENCOUNTER — Encounter: Payer: Self-pay | Admitting: Family Medicine

## 2019-06-18 ENCOUNTER — Ambulatory Visit (HOSPITAL_COMMUNITY)
Admission: RE | Admit: 2019-06-18 | Discharge: 2019-06-18 | Disposition: A | Discharge status: Home / Self Care | Payer: BC Managed Care – PPO | Source: Ambulatory Visit | Attending: Pulmonary Disease | Admitting: Pulmonary Disease

## 2019-06-18 ENCOUNTER — Other Ambulatory Visit: Payer: Self-pay | Admitting: Unknown Physician Specialty

## 2019-06-18 DIAGNOSIS — U071 COVID-19: Secondary | ICD-10-CM | POA: Diagnosis not present

## 2019-06-18 MED ORDER — METHYLPREDNISOLONE SODIUM SUCC 125 MG IJ SOLR: 125.0000 mg | Freq: Once | INTRAMUSCULAR | Status: DC | PRN

## 2019-06-18 MED ORDER — EPINEPHRINE 0.3 MG/0.3ML IJ SOAJ: 0.3000 mg | Freq: Once | INTRAMUSCULAR | Status: DC | PRN

## 2019-06-18 MED ORDER — ALBUTEROL SULFATE HFA 108 (90 BASE) MCG/ACT IN AERS: 2.0000 | INHALATION_SPRAY | Freq: Once | RESPIRATORY_TRACT | Status: DC | PRN

## 2019-06-18 MED ORDER — SODIUM CHLORIDE 0.9 % IV SOLN
700.0000 mg | Freq: Once | INTRAVENOUS | Status: AC
  Administered 2019-06-18: 700 mg via INTRAVENOUS
  Filled 2019-06-18: qty 20

## 2019-06-18 MED ORDER — SODIUM CHLORIDE 0.9 % IV SOLN: INTRAVENOUS | Status: DC | PRN

## 2019-06-18 MED ORDER — DIPHENHYDRAMINE HCL 50 MG/ML IJ SOLN: 50.0000 mg | Freq: Once | INTRAMUSCULAR | Status: DC | PRN

## 2019-06-18 MED ORDER — FAMOTIDINE IN NACL 20-0.9 MG/50ML-% IV SOLN: 20.0000 mg | Freq: Once | INTRAVENOUS | Status: DC | PRN

## 2019-06-18 NOTE — Progress Notes (Addendum)
  Diagnosis: COVID-19  Physician: Dr. Joya Gaskins  Procedure: Covid Infusion Clinic Med: bamlanivimab infusion - Provided patient with bamlanimivab fact sheet for patients, parents and caregivers prior to infusion.  Complications: No immediate complications noted.  Discharge: Discharged home   [place], [place] 06/18/2019

## 2019-06-19 ENCOUNTER — Telehealth: Payer: Self-pay | Admitting: Family Medicine

## 2019-06-19 NOTE — Telephone Encounter (Signed)
Please call pt and check on him  Last night he called after getting infusion of EUA monoclonal antibiotic for COVID-19, he had temp 101.20F and chills, this was listed on the SE profile. He was advised to call PCP if he had symptoms Otherwise no SOB, no swelling, no rash  Advised to take tyenol and monitor symptoms  I did call ER, no protocal, states they would treat the same for "infusion reaction"  Pt knows to report symptoms per protocal to the FDA per the infusion site instructions

## 2019-06-19 NOTE — Telephone Encounter (Signed)
Called and LMOVM on cell number - home number has been dc'd also sent mychart message.

## 2019-06-19 NOTE — Telephone Encounter (Signed)
Pt called back and fever has gone away and other symptoms have also subsided. Informed pt that if he starts to have any related COVID symptoms with SOB to seek medical attention asap.

## 2019-06-19 NOTE — Telephone Encounter (Signed)
Noted  Send to PCP for [facility]

## 2019-06-24 ENCOUNTER — Ambulatory Visit (INDEPENDENT_AMBULATORY_CARE_PROVIDER_SITE_OTHER): Payer: BC Managed Care – PPO | Admitting: Family Medicine

## 2019-06-24 ENCOUNTER — Other Ambulatory Visit: Payer: Self-pay

## 2019-06-24 ENCOUNTER — Encounter: Payer: Self-pay | Admitting: Family Medicine

## 2019-06-24 DIAGNOSIS — U071 COVID-19: Secondary | ICD-10-CM

## 2019-06-24 NOTE — Progress Notes (Signed)
Subjective:    Patient ID: Cody Curry, male    DOB: April 28, 1971, 48 y.o.   MRN: [id number]  HPI Patient is being seen today as a telephone visit.  Phone call began at 910.  Phone call concluded at 919.  Patient was exposed to COVID-19 over Thanksgiving.  Patient developed symptoms of an upper respiratory infection including rhinorrhea and cough on November 30.  He was tested for COVID-19 on December 2 and tested positive.  He received convalescent plasma on December 8.  His last fever was on December 8.  Over the last 6 days he has remained afebrile.  His cough is steadily improved and he is now asymptomatic with no fever, no chills, no cough, no shortness of breath.  He has completed a 14-day quarantine and is requesting to return to work. Past Medical History:  Diagnosis Date  . Diabetes mellitus type 2 in obese ([place])   . Diabetic ketoacidosis without coma associated with type 2 diabetes mellitus ([place])   . DKA, type 2 ([place]) 03/28/2017  . Hypertriglyceridemia    Past Surgical History:  Procedure Laterality Date  . REPLACEMENT TOTAL KNEE     Current Outpatient Medications on File Prior to Visit  Medication Sig Dispense Refill  . atorvastatin (LIPITOR) 40 MG tablet Take 1 tablet (40 mg total) by mouth daily. 90 tablet 3  . Dulaglutide (TRULICITY) 1.5 0000000 SOPN Inject 1.5 mg into the skin once a week. 4 pen 11  . losartan (COZAAR) 50 MG tablet Take 1 tablet (50 mg total) by mouth daily. 90 tablet 3  . metFORMIN (GLUCOPHAGE) 500 MG tablet TAKE 1 TABLET BY MOUTH TWICE A DAY WITH A MEAL 180 tablet 3  . omega-3 acid ethyl esters (LOVAZA) 1 g capsule TAKE 2 CAPSULES BY MOUTH 2 (TWO) TIMES DAILY. 120 capsule 5  . pioglitazone (ACTOS) 30 MG tablet TAKE 1 TABLET BY MOUTH EVERY DAY 90 tablet 1  . sildenafil (VIAGRA) 100 MG tablet Take 0.5-1 tablets (50-100 mg total) by mouth daily as needed for erectile dysfunction. 5 tablet 11   No current facility-administered medications on file prior to  visit.   No Known Allergies Social History   Socioeconomic History  . Marital status: Single    Spouse name: Not on file  . Number of children: Not on file  . Years of education: Not on file  . Highest education level: Not on file  Occupational History  . Not on file  Tobacco Use  . Smoking status: Former Smoker    Packs/day: 1.50    Types: Cigarettes  . Smokeless tobacco: Never Used  Substance and Sexual Activity  . Alcohol use: No    Alcohol/week: 0.0 standard drinks    Comment: Very occasional  . Drug use: No    Comment: quit one year ago  . Sexual activity: Not on file  Other Topics Concern  . Not on file  Social History Narrative  . Not on file   Social Determinants of Health   Financial Resource Strain:   . Difficulty of Paying Living Expenses: Not on file  Food Insecurity:   . Worried About Charity fundraiser in the Last Year: Not on file  . Ran Out of Food in the Last Year: Not on file  Transportation Needs:   . Lack of Transportation (Medical): Not on file  . Lack of Transportation (Non-Medical): Not on file  Physical Activity:   . Days of Exercise per Week: Not on file  .  Minutes of Exercise per Session: Not on file  Stress:   . Feeling of Stress : Not on file  Social Connections:   . Frequency of Communication with Friends and Family: Not on file  . Frequency of Social Gatherings with Friends and Family: Not on file  . Attends Religious Services: Not on file  . Active Member of Clubs or Organizations: Not on file  . Attends Archivist Meetings: Not on file  . Marital Status: Not on file  Intimate Partner Violence:   . Fear of Current or Ex-Partner: Not on file  . Emotionally Abused: Not on file  . Physically Abused: Not on file  . Sexually Abused: Not on file      Review of Systems  All other systems reviewed and are negative.      Objective:   Physical Exam Patient is speaking full and complete sentences over the telephone  with no respiratory distress.  He is afebrile and asymptomatic.       Assessment & Plan:  COVID-19  Patient is now asymptomatic and afebrile.  He has been quarantined for over 14 days since the development of symptoms.  Patient can return to work on December 16.  I drafted a letter and fax it to the patient's employer.

## 2019-07-23 ENCOUNTER — Encounter: Payer: Self-pay | Admitting: Family Medicine

## 2019-07-23 ENCOUNTER — Other Ambulatory Visit: Payer: Self-pay

## 2019-07-23 ENCOUNTER — Ambulatory Visit (INDEPENDENT_AMBULATORY_CARE_PROVIDER_SITE_OTHER): Payer: BC Managed Care – PPO | Admitting: Family Medicine

## 2019-07-23 VITALS — BP 128/68 | HR 90 | Temp 97.5°F | Resp 18 | Ht 70.0 in | Wt 290.0 lb

## 2019-07-23 DIAGNOSIS — E781 Pure hyperglyceridemia: Secondary | ICD-10-CM

## 2019-07-23 DIAGNOSIS — Z794 Long term (current) use of insulin: Secondary | ICD-10-CM | POA: Diagnosis not present

## 2019-07-23 DIAGNOSIS — E118 Type 2 diabetes mellitus with unspecified complications: Secondary | ICD-10-CM

## 2019-07-23 DIAGNOSIS — Z6841 Body Mass Index (BMI) 40.0 and over, adult: Secondary | ICD-10-CM

## 2019-07-23 DIAGNOSIS — I10 Essential (primary) hypertension: Secondary | ICD-10-CM | POA: Diagnosis not present

## 2019-07-23 NOTE — Progress Notes (Signed)
Subjective:    Patient ID: Cody Curry, male    DOB: [birth year], 49 y.o.   MRN: [id number]  HPI  Patient is here today for a follow-up of his diabetes.  He denies any hypoglycemic episodes.  Recently the patient had COVID-19.  He has recovered fully from this.  He denies any chest pain or shortness of breath or dyspnea on exertion.  He denies any fatigue.  He has experienced weight gain and he admits that he is eating poorly.  Unfortunately he has recently left his wife.  They are going through divorce.  His blood pressure today is well controlled at 128/68.  He continues to have neuropathy in his feet.  He reports numbness in his toes.  Diabetic foot exam was performed today and the patient has no sensation to 10 g monofilament in his first through third toes on both feet.  He also has diminished sensation on the balls of his feet and even in the arches.  He has normal pulses however there is no ulceration or calluses.  He states that his sugars are doing relatively well.  He denies seeing any blood sugar over 200 and he denies any hypoglycemic episodes.  His flu shot is up-to-date however he has not had Pneumovax 23.  We discussed this at length with the patient declines this today given his recent illness but would reconsider this again in the future.  He is not taking an aspirin due to the fact he takes Aleve 2 times a day for chronic knee pain and he is concerned about GI upset and bleeding.  He is on a statin.  He denies any myalgias right upper quadrant pain on statin. Past Medical History:  Diagnosis Date  . Diabetes mellitus type 2 in obese ([place])   . Diabetic ketoacidosis without coma associated with type 2 diabetes mellitus ([place])   . DKA, type 2 ([place]) 03/28/2017  . Hypertriglyceridemia    Past Surgical History:  Procedure Laterality Date  . REPLACEMENT TOTAL KNEE     Current Outpatient Medications on File Prior to Visit  Medication Sig Dispense Refill  . atorvastatin (LIPITOR) 40 MG  tablet Take 1 tablet (40 mg total) by mouth daily. 90 tablet 3  . Dulaglutide (TRULICITY) 1.5 0000000 SOPN Inject 1.5 mg into the skin once a week. 4 pen 11  . losartan (COZAAR) 50 MG tablet Take 1 tablet (50 mg total) by mouth daily. 90 tablet 3  . metFORMIN (GLUCOPHAGE) 500 MG tablet TAKE 1 TABLET BY MOUTH TWICE A DAY WITH A MEAL 180 tablet 3  . omega-3 acid ethyl esters (LOVAZA) 1 g capsule TAKE 2 CAPSULES BY MOUTH 2 (TWO) TIMES DAILY. 120 capsule 5  . pioglitazone (ACTOS) 30 MG tablet TAKE 1 TABLET BY MOUTH EVERY DAY 90 tablet 1  . sildenafil (VIAGRA) 100 MG tablet Take 0.5-1 tablets (50-100 mg total) by mouth daily as needed for erectile dysfunction. 5 tablet 11   No current facility-administered medications on file prior to visit.   No Known Allergies Social History   Socioeconomic History  . Marital status: Single    Spouse name: Not on file  . Number of children: Not on file  . Years of education: Not on file  . Highest education level: Not on file  Occupational History  . Not on file  Tobacco Use  . Smoking status: Former Smoker    Packs/day: 1.50    Types: Cigarettes  . Smokeless tobacco: Never Used  Substance  and Sexual Activity  . Alcohol use: No    Alcohol/week: 0.0 standard drinks    Comment: Very occasional  . Drug use: No    Comment: quit one year ago  . Sexual activity: Not on file  Other Topics Concern  . Not on file  Social History Narrative  . Not on file   Social Determinants of Health   Financial Resource Strain:   . Difficulty of Paying Living Expenses: Not on file  Food Insecurity:   . Worried About Charity fundraiser in the Last Year: Not on file  . Ran Out of Food in the Last Year: Not on file  Transportation Needs:   . Lack of Transportation (Medical): Not on file  . Lack of Transportation (Non-Medical): Not on file  Physical Activity:   . Days of Exercise per Week: Not on file  . Minutes of Exercise per Session: Not on file  Stress:     . Feeling of Stress : Not on file  Social Connections:   . Frequency of Communication with Friends and Family: Not on file  . Frequency of Social Gatherings with Friends and Family: Not on file  . Attends Religious Services: Not on file  . Active Member of Clubs or Organizations: Not on file  . Attends Archivist Meetings: Not on file  . Marital Status: Not on file  Intimate Partner Violence:   . Fear of Current or Ex-Partner: Not on file  . Emotionally Abused: Not on file  . Physically Abused: Not on file  . Sexually Abused: Not on file      Review of Systems  All other systems reviewed and are negative.      Objective:   Physical Exam Vitals reviewed.  Constitutional:      Appearance: He is obese.  Cardiovascular:     Rate and Rhythm: Normal rate and regular rhythm.     Pulses: Normal pulses.     Heart sounds: Normal heart sounds.  Pulmonary:     Effort: Pulmonary effort is normal.     Breath sounds: Normal breath sounds.  Neurological:     Mental Status: He is alert.           Assessment & Plan:  Type 2 diabetes mellitus with complication, with long-term current use of insulin (HCC) - Plan: Hemoglobin A1c, CBC with Differential, COMPLETE METABOLIC PANEL WITH GFR, Lipid Panel, Microalbumin, urine  Hypertriglyceridemia  Hypertension, unspecified type  Class 3 severe obesity without serious comorbidity with body mass index (BMI) of 40.0 to 44.9 in adult, unspecified obesity type ([place])  Patient's blood pressure today is well controlled.  I will check a urine microalbumin.  Also check a hemoglobin A1c.  Goal hemoglobin A1c is less than 7.  Check a fasting lipid panel.  Goal LDL cholesterol is less than 100.  I would like to see his triglycerides less than 150.  I have encouraged diet exercise and weight loss.  Offered the patient Pneumovax 23 but he politely declined today.  He can receive this at any time if he prefers.

## 2019-07-24 ENCOUNTER — Other Ambulatory Visit: Payer: Self-pay | Admitting: Family Medicine

## 2019-07-24 DIAGNOSIS — E118 Type 2 diabetes mellitus with unspecified complications: Secondary | ICD-10-CM

## 2019-07-24 LAB — HEMOGLOBIN A1C
Hgb A1c MFr Bld: 6.3 % of total Hgb — ABNORMAL HIGH (ref <5.7)
Mean Plasma Glucose: 134 (calc)
eAG (mmol/L): 7.4 (calc)

## 2019-07-24 LAB — CBC WITH DIFFERENTIAL/PLATELET
Absolute Monocytes: 731 cells/uL (ref 200–950)
Basophils Absolute: 61 cells/uL (ref 0–200)
Basophils Relative: 0.7 %
Eosinophils Absolute: 139 cells/uL (ref 15–500)
Eosinophils Relative: 1.6 %
HCT: 44.2 % (ref 38.5–50.0)
Hemoglobin: 14.8 g/dL (ref 13.2–17.1)
Lymphs Abs: 2462 cells/uL (ref 850–3900)
MCH: 29.7 pg (ref 27.0–33.0)
MCHC: 33.5 g/dL (ref 32.0–36.0)
MCV: 88.6 fL (ref 80.0–100.0)
MPV: 10.3 fL (ref 7.5–12.5)
Monocytes Relative: 8.4 %
Neutro Abs: 5307 cells/uL (ref 1500–7800)
Neutrophils Relative %: 61 %
Platelets: 201 10*3/uL (ref 140–400)
RBC: 4.99 10*6/uL (ref 4.20–5.80)
RDW: 13.1 % (ref 11.0–15.0)
Total Lymphocyte: 28.3 %
WBC: 8.7 10*3/uL (ref 3.8–10.8)

## 2019-07-24 LAB — MICROALBUMIN, URINE: Microalb, Ur: 1.1 mg/dL

## 2019-07-24 LAB — COMPLETE METABOLIC PANEL WITH GFR
AG Ratio: 1.6 (calc) (ref 1.0–2.5)
ALT: 17 U/L (ref 9–46)
AST: 20 U/L (ref 10–40)
Albumin: 4.6 g/dL (ref 3.6–5.1)
Alkaline phosphatase (APISO): 78 U/L (ref 36–130)
BUN: 17 mg/dL (ref 7–25)
CO2: 27 mmol/L (ref 20–32)
Calcium: 9.3 mg/dL (ref 8.6–10.3)
Chloride: 105 mmol/L (ref 98–110)
Creat: 0.92 mg/dL (ref 0.60–1.35)
GFR, Est African American: 113 mL/min/{1.73_m2} (ref >=60)
GFR, Est Non African American: 97 mL/min/{1.73_m2} (ref >=60)
Globulin: 2.8 g/dL (calc) (ref 1.9–3.7)
Glucose, Bld: 89 mg/dL (ref 65–99)
Potassium: 4.4 mmol/L (ref 3.5–5.3)
Sodium: 140 mmol/L (ref 135–146)
Total Bilirubin: 0.4 mg/dL (ref 0.2–1.2)
Total Protein: 7.4 g/dL (ref 6.1–8.1)

## 2019-07-24 LAB — LIPID PANEL
Cholesterol: 139 mg/dL (ref <200)
HDL: 44 mg/dL (ref >=40)
LDL Cholesterol (Calc): 74 mg/dL (calc)
Non-HDL Cholesterol (Calc): 95 mg/dL (calc) (ref <130)
Total CHOL/HDL Ratio: 3.2 (calc) (ref <5.0)
Triglycerides: 125 mg/dL (ref <150)

## 2019-08-05 ENCOUNTER — Other Ambulatory Visit: Payer: Self-pay

## 2019-08-05 ENCOUNTER — Ambulatory Visit (INDEPENDENT_AMBULATORY_CARE_PROVIDER_SITE_OTHER): Payer: BC Managed Care – PPO | Admitting: Family Medicine

## 2019-08-05 ENCOUNTER — Encounter: Payer: Self-pay | Admitting: Family Medicine

## 2019-08-05 VITALS — BP 110/84 | HR 86 | Temp 96.9°F | Resp 18 | Ht 70.0 in | Wt 290.0 lb

## 2019-08-05 DIAGNOSIS — R110 Nausea: Secondary | ICD-10-CM | POA: Diagnosis not present

## 2019-08-05 DIAGNOSIS — R42 Dizziness and giddiness: Secondary | ICD-10-CM

## 2019-08-05 DIAGNOSIS — I10 Essential (primary) hypertension: Secondary | ICD-10-CM

## 2019-08-05 NOTE — Progress Notes (Signed)
Established Patient Office Visit  Subjective:  Patient ID: Cody Curry, male    DOB: 11-10-1970  Age: 49 y.o. MRN: [id number]  CC:  Chief Complaint  Patient presents with  . Dizziness    HPI TYRONN ONNEN is a 49 y.o Caucasian male who presents today with c/o occasional dizziness and nausea episodes that are independent of one another and started approximately 1-2 months ago. He describes the dizziness as the sudden drop in stomach as when going down a roller coaster.  The sxs are not made worse by any one thing. When he has an episode he relaxes for a moment and the sxs subsides. He has used peroxide flushes in both ears thinking that he could have an impaction. Pt denied CP/CT, diaphoresis, GU/GI sxs other than stated, palpitations, pain, or SOB.Dix Hallpike negative. VSS today. Pt does not take VS at home. He reported being married late October, having COVID since then, and separated from new wife and living with one of his daughters and raising his grandson. EKG normal, orthostatics normal.   Past Medical History:  Diagnosis Date  . Diabetes mellitus type 2 in obese ([place])   . Diabetic ketoacidosis without coma associated with type 2 diabetes mellitus ([place])   . DKA, type 2 ([place]) 03/28/2017  . Hypertriglyceridemia     Past Surgical History:  Procedure Laterality Date  . REPLACEMENT TOTAL KNEE      Family History  Problem Relation Age of Onset  . COPD Mother   . Stroke Mother   . Hyperlipidemia Father   . Hypertension Father   . Cancer Father        bladder  . Heart disease Father   . COPD Father     Social History   Socioeconomic History  . Marital status: Single    Spouse name: Not on file  . Number of children: Not on file  . Years of education: Not on file  . Highest education level: Not on file  Occupational History  . Not on file  Tobacco Use  . Smoking status: Former Smoker    Packs/day: 1.50    Types: Cigarettes  . Smokeless tobacco: Never Used    Substance and Sexual Activity  . Alcohol use: No    Alcohol/week: 0.0 standard drinks    Comment: Very occasional  . Drug use: No    Comment: quit one year ago  . Sexual activity: Not on file  Other Topics Concern  . Not on file  Social History Narrative  . Not on file   Social Determinants of Health   Financial Resource Strain:   . Difficulty of Paying Living Expenses: Not on file  Food Insecurity:   . Worried About Charity fundraiser in the Last Year: Not on file  . Ran Out of Food in the Last Year: Not on file  Transportation Needs:   . Lack of Transportation (Medical): Not on file  . Lack of Transportation (Non-Medical): Not on file  Physical Activity:   . Days of Exercise per Week: Not on file  . Minutes of Exercise per Session: Not on file  Stress:   . Feeling of Stress : Not on file  Social Connections:   . Frequency of Communication with Friends and Family: Not on file  . Frequency of Social Gatherings with Friends and Family: Not on file  . Attends Religious Services: Not on file  . Active Member of Clubs or Organizations: Not on file  .  Attends Archivist Meetings: Not on file  . Marital Status: Not on file  Intimate Partner Violence:   . Fear of Current or Ex-Partner: Not on file  . Emotionally Abused: Not on file  . Physically Abused: Not on file  . Sexually Abused: Not on file    Outpatient Medications Prior to Visit  Medication Sig Dispense Refill  . atorvastatin (LIPITOR) 40 MG tablet Take 1 tablet (40 mg total) by mouth daily. 90 tablet 3  . Dulaglutide (TRULICITY) 1.5 0000000 SOPN Inject 1.5 mg into the skin once a week. 4 pen 11  . losartan (COZAAR) 50 MG tablet Take 1 tablet (50 mg total) by mouth daily. 90 tablet 3  . metFORMIN (GLUCOPHAGE) 500 MG tablet TAKE 1 TABLET BY MOUTH TWICE A DAY WITH A MEAL 180 tablet 3  . omega-3 acid ethyl esters (LOVAZA) 1 g capsule TAKE 2 CAPSULES BY MOUTH 2 (TWO) TIMES DAILY. 120 capsule 5  .  pioglitazone (ACTOS) 30 MG tablet TAKE 1 TABLET BY MOUTH EVERY DAY 90 tablet 1  . sildenafil (VIAGRA) 100 MG tablet Take 0.5-1 tablets (50-100 mg total) by mouth daily as needed for erectile dysfunction. 5 tablet 11   No facility-administered medications prior to visit.    No Known Allergies  ROS Review of Systems  All other systems reviewed and are negative.     Objective:    Physical Exam  Constitutional: He is oriented to person, place, and time. He appears well-developed and well-nourished.  HENT:  Head: Normocephalic.  Right Ear: Hearing, tympanic membrane, external ear and ear canal normal.  Left Ear: Hearing and external ear normal.  Ears:  Nose: Nose normal.  Eyes: Pupils are equal, round, and reactive to light. Conjunctivae, EOM and lids are normal. Lids are everted and swept, no foreign bodies found.  Neck: Trachea normal.  Cardiovascular: Normal rate, regular rhythm, S1 normal, S2 normal and normal heart sounds.  Pulses:      Carotid pulses are 2+ on the right side and 2+ on the left side. Pulmonary/Chest: Effort normal and breath sounds normal.  Abdominal: Soft. Bowel sounds are normal.  Musculoskeletal:     Cervical back: Normal range of motion and neck supple.  Neurological: He is alert and oriented to person, place, and time.  Skin: Skin is warm and dry.  Psychiatric: He has a normal mood and affect. His speech is normal and behavior is normal. Judgment and thought content normal. Cognition and memory are normal.  Vitals reviewed.   BP 110/84   Pulse 86   Temp (!) 96.9 F (36.1 C) (Other (Comment))   Resp 18   Ht 5\' 10"  (1.778 m)   Wt 290 lb (131.5 kg)   SpO2 98%   BMI 41.61 kg/m  Wt Readings from Last 3 Encounters:  08/05/19 290 lb (131.5 kg)  07/23/19 290 lb (131.5 kg)  02/25/19 290 lb (131.5 kg)     Health Maintenance Due  Topic Date Due  . PNEUMOCOCCAL POLYSACCHARIDE VACCINE AGE 72-64 HIGH RISK  11/06/1971  . OPHTHALMOLOGY EXAM  11/06/1979     There are no preventive care reminders to display for this patient.  No results found for: TSH Lab Results  Component Value Date   WBC 8.7 07/23/2019   HGB 14.8 07/23/2019   HCT 44.2 07/23/2019   MCV 88.6 07/23/2019   PLT 201 07/23/2019   Lab Results  Component Value Date   NA 140 07/23/2019   K 4.4 07/23/2019  CO2 27 07/23/2019   GLUCOSE 89 07/23/2019   BUN 17 07/23/2019   CREATININE 0.92 07/23/2019   BILITOT 0.4 07/23/2019   ALKPHOS 168 (H) 03/27/2017   AST 20 07/23/2019   ALT 17 07/23/2019   PROT 7.4 07/23/2019   ALBUMIN 4.4 03/27/2017   CALCIUM 9.3 07/23/2019   ANIONGAP 10 03/29/2017   Lab Results  Component Value Date   CHOL 139 07/23/2019   Lab Results  Component Value Date   HDL 44 07/23/2019   Lab Results  Component Value Date   LDLCALC 74 07/23/2019   Lab Results  Component Value Date   TRIG 125 07/23/2019   Lab Results  Component Value Date   CHOLHDL 3.2 07/23/2019   Lab Results  Component Value Date   HGBA1C 6.3 (H) 07/23/2019      Assessment & Plan:   Problem List Items Addressed This Visit      Cardiovascular and Mediastinum   HTN (hypertension)    Other Visit Diagnoses    Vertigo    -  Primary   Relevant Orders   EKG 12-Lead (Completed)   Nausea       Relevant Orders   EKG 12-Lead (Completed)      No orders of the defined types were placed in this encounter.   Follow-up: Return in 4 days (on 08/09/2019) for blood pressure follow up after stopping Losartan. Seen and agree with above.  Patient denies any vertigo and his Dix-Hallpike maneuver is negative.  He really complains of near syncope that occurs at random times unrelated to exercise or exertion.  The patient sounds like he may be having orthostatic hypotension although his blood pressure today is within normal range.  I have recommended that we temporarily discontinue losartan.  I will have the patient contact me later this week with 3 times daily blood pressure  readings.  If his symptoms are improving off the blood pressure medication I believe his "dizziness" is actually due to hypotension due to overmedication.  He denies any tachycardia or arrhythmias.  EKG today shows normal sinus rhythm with normal intervals and a normal axis with no evidence of ischemia or infarction.  Await the results of a trial off medication.  [employer]

## 2019-08-05 NOTE — Patient Instructions (Signed)
Take blood pressure at home at mealtime (3 times a day). Call in your blood pressure results before Friday. Stop Taking Losartan until further instructions. Take blood pressure when you have sxs/episode.

## 2019-09-19 ENCOUNTER — Ambulatory Visit: Payer: BC Managed Care – PPO | Attending: Internal Medicine

## 2019-09-19 DIAGNOSIS — Z23 Encounter for immunization: Secondary | ICD-10-CM

## 2019-09-19 NOTE — Progress Notes (Signed)
   Covid-19 Vaccination Clinic  Name:  Cody Curry    MRN: [id number] DOB: Aug 20, 1970  09/19/2019  Mr. Vanacker was observed post Covid-19 immunization for 15 minutes without incident. He was provided with Vaccine Information Sheet and instruction to access the V-Safe system.   Mr. Mcmillion was instructed to call 911 with any severe reactions post vaccine: Marland Kitchen Difficulty breathing  . Swelling of face and throat  . A fast heartbeat  . A bad rash all over body  . Dizziness and weakness   Immunizations Administered    Name Date Dose VIS Date Route   Pfizer COVID-19 Vaccine 09/19/2019 10:12 AM 0.3 mL 06/21/2019 Intramuscular   Manufacturer: [place]   Lot: [id number]   [place]: [id number]

## 2019-10-14 ENCOUNTER — Ambulatory Visit: Payer: BC Managed Care – PPO | Attending: Internal Medicine

## 2019-10-14 DIAGNOSIS — Z23 Encounter for immunization: Secondary | ICD-10-CM

## 2019-10-14 NOTE — Progress Notes (Signed)
   Covid-19 Vaccination Clinic  Name:  Cody Curry    MRN: [id number] DOB: 06-Jan-1971  10/14/2019  Mr. [name] was observed post Covid-19 immunization for 15 minutes without incident. He was provided with Vaccine Information Sheet and instruction to access the V-Safe system.   Mr. [name] was instructed to call 911 with any severe reactions post vaccine: Marland Kitchen Difficulty breathing  . Swelling of face and throat  . A fast heartbeat  . A bad rash all over body  . Dizziness and weakness   Immunizations Administered    Name Date Dose VIS Date Route   Pfizer COVID-19 Vaccine 10/14/2019 10:01 AM 0.3 mL 06/21/2019 Intramuscular   Manufacturer: [place]   Lot: [id number]   [place]: [id number]

## 2019-10-17 ENCOUNTER — Other Ambulatory Visit: Payer: Self-pay | Admitting: Family Medicine

## 2019-10-17 DIAGNOSIS — E118 Type 2 diabetes mellitus with unspecified complications: Secondary | ICD-10-CM

## 2020-01-09 ENCOUNTER — Other Ambulatory Visit: Payer: Self-pay | Admitting: Family Medicine

## 2020-01-09 DIAGNOSIS — E781 Pure hyperglyceridemia: Secondary | ICD-10-CM

## 2020-01-14 ENCOUNTER — Other Ambulatory Visit: Payer: Self-pay

## 2020-01-14 DIAGNOSIS — E781 Pure hyperglyceridemia: Secondary | ICD-10-CM

## 2020-01-14 MED ORDER — ATORVASTATIN CALCIUM 40 MG PO TABS: 40.0000 mg | ORAL_TABLET | Freq: Every day | ORAL | 3 refills | Status: DC

## 2020-01-17 ENCOUNTER — Ambulatory Visit (INDEPENDENT_AMBULATORY_CARE_PROVIDER_SITE_OTHER): Payer: BC Managed Care – PPO | Admitting: Family Medicine

## 2020-01-17 ENCOUNTER — Other Ambulatory Visit: Payer: Self-pay

## 2020-01-17 VITALS — BP 140/82 | HR 82 | Temp 97.9°F | Ht 70.0 in | Wt 289.0 lb

## 2020-01-17 DIAGNOSIS — E118 Type 2 diabetes mellitus with unspecified complications: Secondary | ICD-10-CM

## 2020-01-17 DIAGNOSIS — I10 Essential (primary) hypertension: Secondary | ICD-10-CM | POA: Diagnosis not present

## 2020-01-17 DIAGNOSIS — E781 Pure hyperglyceridemia: Secondary | ICD-10-CM

## 2020-01-17 DIAGNOSIS — Z794 Long term (current) use of insulin: Secondary | ICD-10-CM | POA: Diagnosis not present

## 2020-01-17 DIAGNOSIS — R6889 Other general symptoms and signs: Secondary | ICD-10-CM | POA: Diagnosis not present

## 2020-01-17 MED ORDER — GABAPENTIN 300 MG PO CAPS: 300.0000 mg | ORAL_CAPSULE | Freq: Three times a day (TID) | ORAL | 3 refills | Status: DC | PRN

## 2020-01-17 NOTE — Progress Notes (Signed)
Subjective:    Patient ID: Cody Curry, male    DOB: 04-01-1971, 49 y.o.   MRN: [id number]  HPI  Patient is here today for a follow-up of his diabetes.  He denies any hypoglycemic episodes.    He denies any chest pain or shortness of breath or dyspnea on exertion.  He denies any fatigue.  He continues to have neuropathy in his feet.  He reports numbness in his toes.  Diabetic foot exam was performed today and the patient has no sensation to 10 g monofilament in his first through third toes on both feet.  He also has diminished sensation on the balls of his feet and even in the arches.  He has normal pulses however there is no ulceration or calluses. Unfortunately he has now developed neuropathic pain in both feet. The pain is aching and throbbing. It is made worse by prolonged standing and walking. Therefore he cannot exercise. This is contributing to his weight gain. This is led to depression. The patient admits that he recently took a week off from work where he just stayed at home and slept. He is under tremendous amount of stress caring for his grandchild that he is having to be the primary caregiver for. All of this is contributed to his stress level and his depression. He denies any suicidal ideation however he admits that he was depressed. He is starting to feel some better. He is scheduled appointment to see a therapist starting next month. We did discuss starting antidepressants today and their indication and role however he is hesitant to want to do that at the present time. He would be interested in anything that could help with the severe neuropathic pain in his feet because he believes that if he can exercise and lose weight he would also start to feel better. Past Medical History:  Diagnosis Date  . Diabetes mellitus type 2 in obese ([place])   . Diabetic ketoacidosis without coma associated with type 2 diabetes mellitus ([place])   . DKA, type 2 ([place]) 03/28/2017  . Hypertriglyceridemia     Past Surgical History:  Procedure Laterality Date  . REPLACEMENT TOTAL KNEE     Current Outpatient Medications on File Prior to Visit  Medication Sig Dispense Refill  . atorvastatin (LIPITOR) 40 MG tablet Take 1 tablet (40 mg total) by mouth daily. 90 tablet 3  . losartan (COZAAR) 50 MG tablet Take 1 tablet (50 mg total) by mouth daily. 90 tablet 3  . metFORMIN (GLUCOPHAGE) 500 MG tablet TAKE 1 TABLET BY MOUTH TWICE A DAY WITH A MEAL 180 tablet 3  . pioglitazone (ACTOS) 30 MG tablet TAKE 1 TABLET BY MOUTH EVERY DAY 90 tablet 1  . sildenafil (VIAGRA) 100 MG tablet Take 0.5-1 tablets (50-100 mg total) by mouth daily as needed for erectile dysfunction. 5 tablet 11  . TRULICITY 1.5 IH/4.7QQ SOPN INJECT 1.5 MG INTO THE SKIN ONCE A WEEK. 4 pen 11  . omega-3 acid ethyl esters (LOVAZA) 1 g capsule TAKE 2 CAPSULES BY MOUTH 2 (TWO) TIMES DAILY. 120 capsule 5   No current facility-administered medications on file prior to visit.   No Known Allergies Social History   Socioeconomic History  . Marital status: Single    Spouse name: Not on file  . Number of children: Not on file  . Years of education: Not on file  . Highest education level: Not on file  Occupational History  . Not on file  Tobacco Use  .  Smoking status: Former Smoker    Packs/day: 1.50    Types: Cigarettes  . Smokeless tobacco: Never Used  Substance and Sexual Activity  . Alcohol use: No    Alcohol/week: 0.0 standard drinks    Comment: Very occasional  . Drug use: No    Comment: quit one year ago  . Sexual activity: Not on file  Other Topics Concern  . Not on file  Social History Narrative  . Not on file   Social Determinants of Health   Financial Resource Strain:   . Difficulty of Paying Living Expenses:   Food Insecurity:   . Worried About Charity fundraiser in the Last Year:   . Arboriculturist in the Last Year:   Transportation Needs:   . Film/video editor (Medical):   Marland Kitchen Lack of Transportation  (Non-Medical):   Physical Activity:   . Days of Exercise per Week:   . Minutes of Exercise per Session:   Stress:   . Feeling of Stress :   Social Connections:   . Frequency of Communication with Friends and Family:   . Frequency of Social Gatherings with Friends and Family:   . Attends Religious Services:   . Active Member of Clubs or Organizations:   . Attends Archivist Meetings:   Marland Kitchen Marital Status:   Intimate Partner Violence:   . Fear of Current or Ex-Partner:   . Emotionally Abused:   Marland Kitchen Physically Abused:   . Sexually Abused:       Review of Systems  All other systems reviewed and are negative.      Objective:   Physical Exam Vitals reviewed.  Constitutional:      Appearance: He is obese.  Cardiovascular:     Rate and Rhythm: Normal rate and regular rhythm.     Pulses: Normal pulses.     Heart sounds: Normal heart sounds.  Pulmonary:     Effort: Pulmonary effort is normal.     Breath sounds: Normal breath sounds.  Neurological:     Mental Status: He is alert.           Assessment & Plan:  Controlled type 2 diabetes mellitus with complication, without long-term current use of insulin (H[facility]) - Plan: Hemoglobin A1c, CBC with Differential/Platelet, COMPLETE METABOLIC PANEL WITH GFR, Lipid panel, Vitamin B12  Hypertriglyceridemia  Hypertension, unspecified type  Patient's blood pressure is adequately controlled at 140/82. I would like to see the patient lose weight and I would like to see him exercise on a daily basis. Therefore we discussed it and he would like to try gabapentin 300 mg p.o. 3 times daily as needed nerve pain. We discussed the risk of dizziness and sedation when he first starts the medication and therefore he was started once a day and gradually uptitrate as necessary and tolerated. I will check an A1c today. Goal hemoglobin A1c is less than 7. He admits that he is only been taking the Metformin once a day. Given the fact that he is on  Metformin, I will check a vitamin B12 level to see if he has B12 deficiency which can certainly contribute to his diabetic neuropathy as Metformin can contribute to B12 deficiency. I will also check a fasting lipid panel. His goal LDL cholesterol is less than 100. Spent 20 minutes today discussing treatment for depression with the patient and he declines starting an SSRI at the present time

## 2020-01-18 LAB — CBC WITH DIFFERENTIAL/PLATELET
Absolute Monocytes: 663 cells/uL (ref 200–950)
Basophils Absolute: 51 cells/uL (ref 0–200)
Basophils Relative: 0.6 %
Eosinophils Absolute: 179 cells/uL (ref 15–500)
Eosinophils Relative: 2.1 %
HCT: 44.4 % (ref 38.5–50.0)
Hemoglobin: 14.8 g/dL (ref 13.2–17.1)
Lymphs Abs: 2865 cells/uL (ref 850–3900)
MCH: 29.5 pg (ref 27.0–33.0)
MCHC: 33.3 g/dL (ref 32.0–36.0)
MCV: 88.6 fL (ref 80.0–100.0)
MPV: 10.7 fL (ref 7.5–12.5)
Monocytes Relative: 7.8 %
Neutro Abs: 4743 cells/uL (ref 1500–7800)
Neutrophils Relative %: 55.8 %
Platelets: 204 10*3/uL (ref 140–400)
RBC: 5.01 10*6/uL (ref 4.20–5.80)
RDW: 13.1 % (ref 11.0–15.0)
Total Lymphocyte: 33.7 %
WBC: 8.5 10*3/uL (ref 3.8–10.8)

## 2020-01-18 LAB — COMPLETE METABOLIC PANEL WITHOUT GFR
AG Ratio: 1.8 (calc) (ref 1.0–2.5)
ALT: 28 U/L (ref 9–46)
AST: 30 U/L (ref 10–35)
Albumin: 4.8 g/dL (ref 3.6–5.1)
Alkaline phosphatase (APISO): 73 U/L (ref 35–144)
BUN: 17 mg/dL (ref 7–25)
CO2: 25 mmol/L (ref 20–32)
Calcium: 9.9 mg/dL (ref 8.6–10.3)
Chloride: 104 mmol/L (ref 98–110)
Creat: 1.11 mg/dL (ref 0.70–1.33)
GFR, Est African American: 89 mL/min/1.73m2
GFR, Est Non African American: 77 mL/min/1.73m2
Globulin: 2.7 g/dL (ref 1.9–3.7)
Glucose, Bld: 88 mg/dL (ref 65–99)
Potassium: 4.1 mmol/L (ref 3.5–5.3)
Sodium: 139 mmol/L (ref 135–146)
Total Bilirubin: 0.6 mg/dL (ref 0.2–1.2)
Total Protein: 7.5 g/dL (ref 6.1–8.1)

## 2020-01-18 LAB — LIPID PANEL
Cholesterol: 216 mg/dL — ABNORMAL HIGH
HDL: 34 mg/dL — ABNORMAL LOW
LDL Cholesterol (Calc): 133 mg/dL — ABNORMAL HIGH
Non-HDL Cholesterol (Calc): 182 mg/dL — ABNORMAL HIGH
Total CHOL/HDL Ratio: 6.4 (calc) — ABNORMAL HIGH
Triglycerides: 340 mg/dL — ABNORMAL HIGH

## 2020-01-18 LAB — HEMOGLOBIN A1C
Hgb A1c MFr Bld: 6.7 %{Hb} — ABNORMAL HIGH
Mean Plasma Glucose: 146 (calc)
eAG (mmol/L): 8.1 (calc)

## 2020-01-18 LAB — VITAMIN B12: Vitamin B-12: 342 pg/mL (ref 200–1100)

## 2020-05-02 ENCOUNTER — Telehealth: Payer: Self-pay | Admitting: Family Medicine

## 2020-05-02 NOTE — Telephone Encounter (Signed)
Pharmacy:[facility] [address]  Medication: metFORMIN (GLUCOPHAGE) 500 MG tablet  Qty:180  UOR:VIFB 1 tablet by mouth twice a day with a meal

## 2020-05-04 ENCOUNTER — Other Ambulatory Visit: Payer: Self-pay

## 2020-05-04 MED ORDER — METFORMIN HCL 500 MG PO TABS: ORAL_TABLET | ORAL | 3 refills | Status: DC

## 2020-05-07 ENCOUNTER — Other Ambulatory Visit: Payer: Self-pay | Admitting: Family Medicine

## 2020-05-07 DIAGNOSIS — E118 Type 2 diabetes mellitus with unspecified complications: Secondary | ICD-10-CM

## 2020-05-08 ENCOUNTER — Other Ambulatory Visit: Payer: Self-pay

## 2020-06-27 ENCOUNTER — Other Ambulatory Visit: Payer: Self-pay | Admitting: Family Medicine

## 2020-08-07 ENCOUNTER — Other Ambulatory Visit: Payer: Self-pay

## 2020-08-07 ENCOUNTER — Ambulatory Visit: Payer: BC Managed Care – PPO | Admitting: Family Medicine

## 2020-08-07 VITALS — BP 132/82 | HR 73 | Temp 97.7°F | Resp 16 | Ht 70.0 in | Wt 283.0 lb

## 2020-08-07 DIAGNOSIS — E781 Pure hyperglyceridemia: Secondary | ICD-10-CM

## 2020-08-07 DIAGNOSIS — E118 Type 2 diabetes mellitus with unspecified complications: Secondary | ICD-10-CM | POA: Diagnosis not present

## 2020-08-07 DIAGNOSIS — E1169 Type 2 diabetes mellitus with other specified complication: Secondary | ICD-10-CM | POA: Diagnosis not present

## 2020-08-07 DIAGNOSIS — Z794 Long term (current) use of insulin: Secondary | ICD-10-CM | POA: Diagnosis not present

## 2020-08-07 DIAGNOSIS — I10 Essential (primary) hypertension: Secondary | ICD-10-CM

## 2020-08-07 DIAGNOSIS — Z6841 Body Mass Index (BMI) 40.0 and over, adult: Secondary | ICD-10-CM

## 2020-08-07 NOTE — Progress Notes (Signed)
Subjective:    Patient ID: Cody Curry, male    DOB: 10/30/1970, 50 y.o.   MRN: [id number]  HPI  Patient is here today for a follow-up of his diabetes.  He denies any hypoglycemic episodes.    He denies any chest pain or shortness of breath or dyspnea on exertion.  He denies any fatigue.  He continues to have neuropathy in his feet.  He reports numbness in his toes.  Diabetic foot exam was performed today and the patient has no sensation to 10 g monofilament.  He states that his lowest blood sugar has been 99.  His highest has been in the 120s however he admits that he is not checking it regularly.  He does report some occasional blurry vision.  He is long overdue for diabetic eye exam.  His blood pressure today is well controlled.  He denies any myopathy due to his statin.  He does complain of some numbness and tingling in both hands from his wrist to his fingertips.  He has been diagnosed with severe carpal tunnel syndrome however he has deferred surgery although he is reconsidering it at this time.  2 weeks ago he injured his lower back while working on a sled.  He reported burning pain in both gluteus muscles and burning stinging pain radiating down his right leg.  This is gradually improved on its own and is now almost 80% better Past Medical History:  Diagnosis Date  . Diabetes mellitus type 2 in obese ([place])   . Diabetic ketoacidosis without coma associated with type 2 diabetes mellitus ([place])   . DKA, type 2 ([place]) 03/28/2017  . Hypertriglyceridemia    Past Surgical History:  Procedure Laterality Date  . REPLACEMENT TOTAL KNEE     Current Outpatient Medications on File Prior to Visit  Medication Sig Dispense Refill  . atorvastatin (LIPITOR) 40 MG tablet Take 1 tablet (40 mg total) by mouth daily. 90 tablet 3  . gabapentin (NEURONTIN) 300 MG capsule TAKE 1 CAPSULE (300 MG TOTAL) BY MOUTH 3 (THREE) TIMES DAILY AS NEEDED. 90 capsule 3  . losartan (COZAAR) 50 MG tablet Take 1 tablet (50 mg  total) by mouth daily. 90 tablet 3  . metFORMIN (GLUCOPHAGE) 500 MG tablet TAKE 1 TABLET BY MOUTH TWICE A DAY WITH A MEAL 180 tablet 3  . pioglitazone (ACTOS) 30 MG tablet TAKE 1 TABLET BY MOUTH EVERY DAY 90 tablet 1  . sildenafil (VIAGRA) 100 MG tablet Take 0.5-1 tablets (50-100 mg total) by mouth daily as needed for erectile dysfunction. 5 tablet 11  . TRULICITY 1.5 NU/2.7OZ SOPN INJECT 1.5 MG INTO THE SKIN ONCE A WEEK. 4 pen 11  . omega-3 acid ethyl esters (LOVAZA) 1 g capsule TAKE 2 CAPSULES BY MOUTH 2 (TWO) TIMES DAILY. 120 capsule 5   No current facility-administered medications on file prior to visit.   No Known Allergies Social History   Socioeconomic History  . Marital status: Single    Spouse name: Not on file  . Number of children: Not on file  . Years of education: Not on file  . Highest education level: Not on file  Occupational History  . Not on file  Tobacco Use  . Smoking status: Former Smoker    Packs/day: 1.50    Types: Cigarettes  . Smokeless tobacco: Never Used  Substance and Sexual Activity  . Alcohol use: No    Alcohol/week: 0.0 standard drinks    Comment: Very occasional  . Drug  use: No    Comment: quit one year ago  . Sexual activity: Not on file  Other Topics Concern  . Not on file  Social History Narrative  . Not on file   Social Determinants of Health   Financial Resource Strain: Not on file  Food Insecurity: Not on file  Transportation Needs: Not on file  Physical Activity: Not on file  Stress: Not on file  Social Connections: Not on file  Intimate Partner Violence: Not on file      Review of Systems  Musculoskeletal: Positive for back pain.  All other systems reviewed and are negative.      Objective:   Physical Exam Vitals reviewed.  Constitutional:      Appearance: He is obese.  Cardiovascular:     Rate and Rhythm: Normal rate and regular rhythm.     Pulses: Normal pulses.     Heart sounds: Normal heart sounds.   Pulmonary:     Effort: Pulmonary effort is normal.     Breath sounds: Normal breath sounds.  Neurological:     Mental Status: He is alert.           Assessment & Plan:  Hypertriglyceridemia - Plan: CBC with Differential/Platelet, COMPLETE METABOLIC PANEL WITH GFR, Lipid panel, Microalbumin, urine, Hemoglobin A1c  Class 3 severe obesity without serious comorbidity with body mass index (BMI) of 40.0 to 44.9 in adult, unspecified obesity type ([place]) - Plan: CBC with Differential/Platelet, COMPLETE METABOLIC PANEL WITH GFR, Lipid panel, Microalbumin, urine, Hemoglobin A1c  Controlled type 2 diabetes mellitus with other specified complication, without long-term current use of insulin ([place]) - Plan: CBC with Differential/Platelet, COMPLETE METABOLIC PANEL WITH GFR, Lipid panel, Microalbumin, urine, Hemoglobin A1c, Ambulatory referral to Ophthalmology  Primary hypertension - Plan: CBC with Differential/Platelet, COMPLETE METABOLIC PANEL WITH GFR, Lipid panel, Microalbumin, urine, Hemoglobin A1c   Blood pressure today is adequately controlled.  I will check a CBC, CMP, lipid panel, microalbumin, and A1c.  Goal A1c is less than 6.5.  Goal albumin to creatinine ratio is less than 30.  Goal LDL cholesterol is less than 100.  We will schedule the patient to meet with an ophthalmologist.  Diabetic foot exam is up-to-date and shows significant neuropathy.  I believe the pain in his back was likely sciatica due to herniated disc however this is gradually improving on its own so no treatment is necessary at this time

## 2020-08-08 LAB — MICROALBUMIN, URINE: Microalb, Ur: 0.8 mg/dL

## 2020-08-08 LAB — CBC WITH DIFFERENTIAL/PLATELET
Absolute Monocytes: 904 cells/uL (ref 200–950)
Basophils Absolute: 45 cells/uL (ref 0–200)
Basophils Relative: 0.4 %
Eosinophils Absolute: 170 cells/uL (ref 15–500)
Eosinophils Relative: 1.5 %
HCT: 46.7 % (ref 38.5–50.0)
Hemoglobin: 15.8 g/dL (ref 13.2–17.1)
Lymphs Abs: 2882 cells/uL (ref 850–3900)
MCH: 30.7 pg (ref 27.0–33.0)
MCHC: 33.8 g/dL (ref 32.0–36.0)
MCV: 90.9 fL (ref 80.0–100.0)
MPV: 10.2 fL (ref 7.5–12.5)
Monocytes Relative: 8 %
Neutro Abs: 7300 cells/uL (ref 1500–7800)
Neutrophils Relative %: 64.6 %
Platelets: 223 10*3/uL (ref 140–400)
RBC: 5.14 10*6/uL (ref 4.20–5.80)
RDW: 12.3 % (ref 11.0–15.0)
Total Lymphocyte: 25.5 %
WBC: 11.3 10*3/uL — ABNORMAL HIGH (ref 3.8–10.8)

## 2020-08-08 LAB — LIPID PANEL
Cholesterol: 149 mg/dL
HDL: 43 mg/dL
LDL Cholesterol (Calc): 81 mg/dL
Non-HDL Cholesterol (Calc): 106 mg/dL
Total CHOL/HDL Ratio: 3.5 (calc)
Triglycerides: 146 mg/dL

## 2020-08-08 LAB — HEMOGLOBIN A1C
Hgb A1c MFr Bld: 6.2 % of total Hgb — ABNORMAL HIGH (ref <5.7)
Mean Plasma Glucose: 131 mg/dL
eAG (mmol/L): 7.3 mmol/L

## 2020-08-08 LAB — COMPLETE METABOLIC PANEL WITH GFR
AG Ratio: 1.6 (calc) (ref 1.0–2.5)
ALT: 21 U/L (ref 9–46)
AST: 24 U/L (ref 10–35)
Albumin: 4.6 g/dL (ref 3.6–5.1)
Alkaline phosphatase (APISO): 74 U/L (ref 35–144)
BUN: 19 mg/dL (ref 7–25)
CO2: 30 mmol/L (ref 20–32)
Calcium: 9.8 mg/dL (ref 8.6–10.3)
Chloride: 102 mmol/L (ref 98–110)
Creat: 1.1 mg/dL (ref 0.70–1.33)
GFR, Est African American: 90 mL/min/{1.73_m2} (ref >=60)
GFR, Est Non African American: 78 mL/min/{1.73_m2} (ref >=60)
Globulin: 2.9 g/dL (calc) (ref 1.9–3.7)
Glucose, Bld: 91 mg/dL (ref 65–99)
Potassium: 4.8 mmol/L (ref 3.5–5.3)
Sodium: 140 mmol/L (ref 135–146)
Total Bilirubin: 0.7 mg/dL (ref 0.2–1.2)
Total Protein: 7.5 g/dL (ref 6.1–8.1)

## 2020-09-14 ENCOUNTER — Encounter: Payer: Self-pay | Admitting: Family Medicine

## 2020-09-14 DIAGNOSIS — H2513 Age-related nuclear cataract, bilateral: Secondary | ICD-10-CM | POA: Diagnosis not present

## 2020-09-14 DIAGNOSIS — E119 Type 2 diabetes mellitus without complications: Secondary | ICD-10-CM | POA: Diagnosis not present

## 2020-09-14 LAB — HM DIABETES EYE EXAM

## 2020-10-01 ENCOUNTER — Ambulatory Visit: Payer: BC Managed Care – PPO | Admitting: Family Medicine

## 2020-10-05 ENCOUNTER — Encounter: Payer: Self-pay | Admitting: Family Medicine

## 2020-10-05 ENCOUNTER — Ambulatory Visit (INDEPENDENT_AMBULATORY_CARE_PROVIDER_SITE_OTHER): Payer: BC Managed Care – PPO | Admitting: Family Medicine

## 2020-10-05 ENCOUNTER — Other Ambulatory Visit: Payer: Self-pay

## 2020-10-05 VITALS — BP 128/72 | HR 78 | Temp 97.9°F | Resp 14 | Ht 70.0 in | Wt 284.0 lb

## 2020-10-05 DIAGNOSIS — E1169 Type 2 diabetes mellitus with other specified complication: Secondary | ICD-10-CM | POA: Diagnosis not present

## 2020-10-05 DIAGNOSIS — M79672 Pain in left foot: Secondary | ICD-10-CM | POA: Diagnosis not present

## 2020-10-05 NOTE — Progress Notes (Signed)
Subjective:    Patient ID: Cody Curry, male    DOB: [birth year], 50 y.o.   MRN: [id number]  Patient is a very pleasant 50 year old Caucasian male who presents today complaining of pain in his left foot.  He has a history of diabetes and diabetic neuropathy for which he takes gabapentin however this pain is different.  First he has a thick hyperkeratotic papule forming on the lateral aspect of the third digit just proximal to the IP joint.  It feels like a corn.  There is no skin breakdown but it is tender to touch.  Second he has pain in the webspace between the third and fourth toes.  This is primarily on the plantar aspect.  This radiates up his foot.  It radiates up the dorsum of the foot along the body of the fourth metatarsal all the way up to the midfoot.  I do not palpate a lesion in the webspace but the location and the shooting nature of the pain leads me to believe that this could be a Morton's neuroma.  Last, he complains of a compressive type pain along the MTP joints all along the feet.  He states that it feels like someone squeezing his toes in a vice per tickly the longer that he stands on them.  He has excellent pulses at the dorsalis pedis and posterior tibialis bilaterally.  His feet are numb and have diminished sensation to 10 g monofilament. Past Medical History:  Diagnosis Date  . Diabetes mellitus type 2 in obese ([place])   . Diabetic ketoacidosis without coma associated with type 2 diabetes mellitus ([place])   . DKA, type 2 ([place]) 03/28/2017  . Hypertriglyceridemia    Past Surgical History:  Procedure Laterality Date  . REPLACEMENT TOTAL KNEE     Current Outpatient Medications on File Prior to Visit  Medication Sig Dispense Refill  . aspirin EC 81 MG tablet Take 81 mg by mouth daily. Swallow whole.    Marland Kitchen atorvastatin (LIPITOR) 40 MG tablet Take 1 tablet (40 mg total) by mouth daily. 90 tablet 3  . gabapentin (NEURONTIN) 300 MG capsule TAKE 1 CAPSULE (300 MG TOTAL) BY MOUTH 3  (THREE) TIMES DAILY AS NEEDED. 90 capsule 3  . losartan (COZAAR) 50 MG tablet Take 1 tablet (50 mg total) by mouth daily. 90 tablet 3  . metFORMIN (GLUCOPHAGE) 500 MG tablet TAKE 1 TABLET BY MOUTH TWICE A DAY WITH A MEAL 180 tablet 3  . pioglitazone (ACTOS) 30 MG tablet TAKE 1 TABLET BY MOUTH EVERY DAY 90 tablet 1  . sildenafil (VIAGRA) 100 MG tablet Take 0.5-1 tablets (50-100 mg total) by mouth daily as needed for erectile dysfunction. 5 tablet 11  . TRULICITY 1.5 LY/6.5KP SOPN INJECT 1.5 MG INTO THE SKIN ONCE A WEEK. 4 pen 11   No current facility-administered medications on file prior to visit.   No Known Allergies Social History   Socioeconomic History  . Marital status: Single    Spouse name: Not on file  . Number of children: Not on file  . Years of education: Not on file  . Highest education level: Not on file  Occupational History  . Not on file  Tobacco Use  . Smoking status: Former Smoker    Packs/day: 1.50    Types: Cigarettes  . Smokeless tobacco: Never Used  Substance and Sexual Activity  . Alcohol use: No    Alcohol/week: 0.0 standard drinks    Comment: Very occasional  . Drug  use: No    Comment: quit one year ago  . Sexual activity: Not on file  Other Topics Concern  . Not on file  Social History Narrative  . Not on file   Social Determinants of Health   Financial Resource Strain: Not on file  Food Insecurity: Not on file  Transportation Needs: Not on file  Physical Activity: Not on file  Stress: Not on file  Social Connections: Not on file  Intimate Partner Violence: Not on file      Review of Systems  Musculoskeletal: Positive for back pain.  All other systems reviewed and are negative.      Objective:   Physical Exam Vitals reviewed.  Constitutional:      Appearance: He is obese.  Cardiovascular:     Rate and Rhythm: Normal rate and regular rhythm.     Pulses: Normal pulses.          Dorsalis pedis pulses are 2+ on the left side.        Posterior tibial pulses are 2+ on the left side.     Heart sounds: Normal heart sounds.  Pulmonary:     Effort: Pulmonary effort is normal.     Breath sounds: Normal breath sounds.  Musculoskeletal:     Left foot: Prominent metatarsal heads present.       Feet:  Feet:     Left foot:     Skin integrity: Callus present. No ulcer, blister, skin breakdown, erythema or warmth.  Neurological:     Mental Status: He is alert.           Assessment & Plan:  Left foot pain  Controlled type 2 diabetes mellitus with other specified complication, without long-term current use of insulin ([place])  I believe he has multifactorial left foot pain.  He certainly has diabetic neuropathy which he takes gabapentin for.  However he is also developing a corn/callus on the lateral aspect of the third IP joint.  I recommended over-the-counter corn and callus remover for this.  However I believe the pain that radiates starting in the webspace between the third and fourth toes which radiates up the fourth metatarsal is most likely a Morton's neuroma.  He also has a collapsed transverse arch with prominent metatarsal heads and also metatarsalgia with prolonged standing and walking.  I believe this may be remedied by orthotics.  Therefore I recommended a referral to a podiatrist for a possible cortisone injection for Morton's neuroma as well as consideration for custom orthotics.

## 2020-10-15 ENCOUNTER — Ambulatory Visit (INDEPENDENT_AMBULATORY_CARE_PROVIDER_SITE_OTHER): Payer: BC Managed Care – PPO

## 2020-10-15 ENCOUNTER — Other Ambulatory Visit: Payer: Self-pay | Admitting: Podiatry

## 2020-10-15 ENCOUNTER — Ambulatory Visit: Payer: BC Managed Care – PPO | Admitting: Podiatry

## 2020-10-15 ENCOUNTER — Other Ambulatory Visit: Payer: Self-pay

## 2020-10-15 DIAGNOSIS — G629 Polyneuropathy, unspecified: Secondary | ICD-10-CM

## 2020-10-15 DIAGNOSIS — G5792 Unspecified mononeuropathy of left lower limb: Secondary | ICD-10-CM

## 2020-10-15 DIAGNOSIS — M778 Other enthesopathies, not elsewhere classified: Secondary | ICD-10-CM

## 2020-10-15 DIAGNOSIS — M79672 Pain in left foot: Secondary | ICD-10-CM

## 2020-10-15 DIAGNOSIS — M21619 Bunion of unspecified foot: Secondary | ICD-10-CM

## 2020-10-15 DIAGNOSIS — M2042 Other hammer toe(s) (acquired), left foot: Secondary | ICD-10-CM

## 2020-10-15 MED ORDER — TRIAMCINOLONE ACETONIDE 10 MG/ML IJ SUSP
20.0000 mg | Freq: Once | INTRAMUSCULAR | Status: AC
  Administered 2020-10-15: 20 mg

## 2020-10-15 NOTE — Progress Notes (Signed)
Dh

## 2020-10-15 NOTE — Progress Notes (Signed)
Subjective:   Patient ID: Cody Curry, male   DOB: 50 y.o.   MRN: [id number]   HPI Patient presents stating that he has had a lot of pain in his left forefoot for the last 6 months and a spot between his third and fourth toes which has been inflamed and has generalized tingling burning in both feet with long-term diabetes for years duration under reasonably good control at this time.  Patient states he feels like the second toe is moved up and over over this last 6 months and he does not smoke and he does not like to be active if possible with moderate obesity   Review of Systems  All other systems reviewed and are negative.       Objective:  Physical Exam Vitals and nursing note reviewed.  Constitutional:      Appearance: He is well-developed.  Pulmonary:     Effort: Pulmonary effort is normal.  Musculoskeletal:        General: Normal range of motion.  Skin:    General: Skin is warm.  Neurological:     Mental Status: He is alert.     Neurovascular status was found to be intact muscle strength was found to be adequate range of motion adequate.  Patient is found to have exquisite discomfort in the second metatarsal phalangeal joint left with inflammation elevation and medial dislocation of the second digit left with pain and a lesion between the third and fourth toe left at the interphalangeal joint lateral side that sore with keratotic tissue formation.  Patient has moderate diminishment sharp dull vibratory and patient has good digital perfusion     Assessment:  Possibility for flexor plate dislocation second MPJ left with capsulitis-like symptoms of the second MPJ along with inner phalangeal joint capsulitis third digit left painful when pressed and generalized neuropathic symptoms secondary to diabetes that was probably not under good control for a good period of time     Plan:  H&P reviewed all conditions discussed diabetic foot inspections and explained procedure to try to  help his left foot with the possibility for digital fusion shortening osteotomy bunion correction that may be necessary.  Today I went ahead and I did do block of the left forefoot 60 mg like Marcaine mixture I aspirated the second MPJ I was able to get out a small amount of clear fluid I injected quarter cc dexamethasone Kenalog and 1/4 cc dexamethasone Kenalog into the inner phalangeal joint digit 3 left.  Debrided lesion third left I want to see back 3 weeks and see response  X-rays indicate significant structural bunion deformity bilateral with significant dorsal medial dislocation second digit left

## 2020-11-05 ENCOUNTER — Encounter: Payer: Self-pay | Admitting: Podiatry

## 2020-11-05 ENCOUNTER — Other Ambulatory Visit: Payer: Self-pay

## 2020-11-05 ENCOUNTER — Ambulatory Visit: Payer: BC Managed Care – PPO | Admitting: Family Medicine

## 2020-11-05 ENCOUNTER — Ambulatory Visit (INDEPENDENT_AMBULATORY_CARE_PROVIDER_SITE_OTHER): Payer: BC Managed Care – PPO | Admitting: Podiatry

## 2020-11-05 ENCOUNTER — Encounter: Payer: Self-pay | Admitting: Family Medicine

## 2020-11-05 VITALS — BP 128/66 | HR 82 | Temp 98.7°F | Resp 14 | Ht 70.0 in | Wt 282.0 lb

## 2020-11-05 DIAGNOSIS — G629 Polyneuropathy, unspecified: Secondary | ICD-10-CM | POA: Diagnosis not present

## 2020-11-05 DIAGNOSIS — Z125 Encounter for screening for malignant neoplasm of prostate: Secondary | ICD-10-CM

## 2020-11-05 DIAGNOSIS — M778 Other enthesopathies, not elsewhere classified: Secondary | ICD-10-CM | POA: Diagnosis not present

## 2020-11-05 DIAGNOSIS — I10 Essential (primary) hypertension: Secondary | ICD-10-CM

## 2020-11-05 DIAGNOSIS — E781 Pure hyperglyceridemia: Secondary | ICD-10-CM | POA: Diagnosis not present

## 2020-11-05 DIAGNOSIS — E1169 Type 2 diabetes mellitus with other specified complication: Secondary | ICD-10-CM | POA: Diagnosis not present

## 2020-11-05 DIAGNOSIS — Z6841 Body Mass Index (BMI) 40.0 and over, adult: Secondary | ICD-10-CM

## 2020-11-05 DIAGNOSIS — M2042 Other hammer toe(s) (acquired), left foot: Secondary | ICD-10-CM

## 2020-11-05 DIAGNOSIS — Z1211 Encounter for screening for malignant neoplasm of colon: Secondary | ICD-10-CM

## 2020-11-05 NOTE — Progress Notes (Signed)
Subjective:    Patient ID: Cody Curry, male    DOB: 1971-04-18, 50 y.o.   MRN: [id number]  HPI  Patient's hemoglobin A1c was 6.2 in January and outstanding!  He is seeing a podiatrist for the pain in his foot and this is helped him immensely.  He is also active in a Bible study at his church and this is also helping him.  He seems to be in better spirits now than I remember him being earlier this winter.  He seems much happier and less sad and depressed.  I congratulated him on this change.  He is overdue for a colonoscopy.  He is also overdue for prostate cancer screening.  Past Medical History:  Diagnosis Date  . Diabetes mellitus type 2 in obese ([place])   . Diabetic ketoacidosis without coma associated with type 2 diabetes mellitus ([place])   . DKA, type 2 ([place]) 03/28/2017  . Hypertriglyceridemia    Past Surgical History:  Procedure Laterality Date  . REPLACEMENT TOTAL KNEE     Current Outpatient Medications on File Prior to Visit  Medication Sig Dispense Refill  . aspirin EC 81 MG tablet Take 81 mg by mouth daily. Swallow whole.    Marland Kitchen atorvastatin (LIPITOR) 40 MG tablet Take 1 tablet (40 mg total) by mouth daily. 90 tablet 3  . gabapentin (NEURONTIN) 300 MG capsule TAKE 1 CAPSULE (300 MG TOTAL) BY MOUTH 3 (THREE) TIMES DAILY AS NEEDED. 90 capsule 3  . losartan (COZAAR) 50 MG tablet Take 1 tablet (50 mg total) by mouth daily. 90 tablet 3  . metFORMIN (GLUCOPHAGE) 500 MG tablet TAKE 1 TABLET BY MOUTH TWICE A DAY WITH A MEAL 180 tablet 3  . pioglitazone (ACTOS) 30 MG tablet TAKE 1 TABLET BY MOUTH EVERY DAY 90 tablet 1  . sildenafil (VIAGRA) 100 MG tablet Take 0.5-1 tablets (50-100 mg total) by mouth daily as needed for erectile dysfunction. 5 tablet 11  . TRULICITY 1.5 WU/9.8JX SOPN INJECT 1.5 MG INTO THE SKIN ONCE A WEEK. 4 pen 11   No current facility-administered medications on file prior to visit.   No Known Allergies Social History   Socioeconomic History  . Marital status:  Single    Spouse name: Not on file  . Number of children: Not on file  . Years of education: Not on file  . Highest education level: Not on file  Occupational History  . Not on file  Tobacco Use  . Smoking status: Former Smoker    Packs/day: 1.50    Types: Cigarettes  . Smokeless tobacco: Never Used  Substance and Sexual Activity  . Alcohol use: No    Alcohol/week: 0.0 standard drinks    Comment: Very occasional  . Drug use: No    Comment: quit one year ago  . Sexual activity: Not on file  Other Topics Concern  . Not on file  Social History Narrative  . Not on file   Social Determinants of Health   Financial Resource Strain: Not on file  Food Insecurity: Not on file  Transportation Needs: Not on file  Physical Activity: Not on file  Stress: Not on file  Social Connections: Not on file  Intimate Partner Violence: Not on file      Review of Systems  All other systems reviewed and are negative.      Objective:   Physical Exam Vitals reviewed.  Constitutional:      Appearance: He is obese.  Cardiovascular:  Rate and Rhythm: Normal rate and regular rhythm.     Pulses: Normal pulses.     Heart sounds: Normal heart sounds.  Pulmonary:     Effort: Pulmonary effort is normal.     Breath sounds: Normal breath sounds.  Neurological:     Mental Status: He is alert.           Assessment & Plan:  Controlled type 2 diabetes mellitus with other specified complication, without long-term current use of insulin ([facility]) - Plan: Hemoglobin A1c, CBC with Differential/Platelet, COMPLETE METABOLIC PANEL WITH GFR, Lipid panel, Microalbumin, urine  Hypertriglyceridemia  Class 3 severe obesity without serious comorbidity with body mass index (BMI) of 40.0 to 44.9 in adult, unspecified obesity type ([place])  Primary hypertension  Colon cancer screening - Plan: Ambulatory referral to Gastroenterology  Prostate cancer screening - Plan: PSA  Most of our visit today was  simply spent in talking.  I like him to come by fasting so I can check an A1c.  I would also like to check a fasting lipid panel.  Goal A1c is less than 6.5.  Goal LDL cholesterol is less than 100.  I will check a urine microalbumin.  Goal albumin to creatinine ratio is less than 30.  I would like to check a PSA to screen for prostate cancer.  Refer to GI for a colonoscopy.  The remainder of his preventative care is up-to-date

## 2020-11-05 NOTE — Progress Notes (Signed)
Subjective:   Patient ID: Cody Curry, male   DOB: 50 y.o.   MRN: [id number]   HPI Patient presents stating he is still getting pain in his left foot with some improvement and states he is trying to be active and lose weight and this is impeding his progress   ROS      Objective:  Physical Exam  Neurovascular status intact with patient found to have moderate dorsal medial dislocation second digit left with moderate inflammation of the joint and probability for flexor plate pathology     Assessment:  Moderately obese male with inflammatory second MPJ left with probable flexor plate pathology     Plan:  H&P reviewed condition and went ahead casted for functional orthotics to offload weight off the joint surface.  Educated him on this and patient is to be seen when orthotics return and encouraged to call questions concerns

## 2020-11-06 ENCOUNTER — Other Ambulatory Visit: Payer: BC Managed Care – PPO

## 2020-11-08 ENCOUNTER — Other Ambulatory Visit: Payer: Self-pay | Admitting: Family Medicine

## 2020-11-08 DIAGNOSIS — E118 Type 2 diabetes mellitus with unspecified complications: Secondary | ICD-10-CM

## 2020-11-11 ENCOUNTER — Other Ambulatory Visit: Payer: Self-pay

## 2020-11-11 ENCOUNTER — Other Ambulatory Visit: Payer: BC Managed Care – PPO

## 2020-11-11 DIAGNOSIS — E1169 Type 2 diabetes mellitus with other specified complication: Secondary | ICD-10-CM | POA: Diagnosis not present

## 2020-11-11 DIAGNOSIS — Z125 Encounter for screening for malignant neoplasm of prostate: Secondary | ICD-10-CM | POA: Diagnosis not present

## 2020-11-12 LAB — CBC WITH DIFFERENTIAL/PLATELET
Absolute Monocytes: 792 cells/uL (ref 200–950)
Basophils Absolute: 61 cells/uL (ref 0–200)
Basophils Relative: 0.7 %
Eosinophils Absolute: 122 cells/uL (ref 15–500)
Eosinophils Relative: 1.4 %
HCT: 44.9 % (ref 38.5–50.0)
Hemoglobin: 15.3 g/dL (ref 13.2–17.1)
Lymphs Abs: 2801 cells/uL (ref 850–3900)
MCH: 31.4 pg (ref 27.0–33.0)
MCHC: 34.1 g/dL (ref 32.0–36.0)
MCV: 92 fL (ref 80.0–100.0)
MPV: 10.2 fL (ref 7.5–12.5)
Monocytes Relative: 9.1 %
Neutro Abs: 4924 cells/uL (ref 1500–7800)
Neutrophils Relative %: 56.6 %
Platelets: 202 10*3/uL (ref 140–400)
RBC: 4.88 10*6/uL (ref 4.20–5.80)
RDW: 12.5 % (ref 11.0–15.0)
Total Lymphocyte: 32.2 %
WBC: 8.7 10*3/uL (ref 3.8–10.8)

## 2020-11-12 LAB — COMPLETE METABOLIC PANEL WITH GFR
AG Ratio: 1.8 (calc) (ref 1.0–2.5)
ALT: 21 U/L (ref 9–46)
AST: 24 U/L (ref 10–35)
Albumin: 4.7 g/dL (ref 3.6–5.1)
Alkaline phosphatase (APISO): 68 U/L (ref 35–144)
BUN: 16 mg/dL (ref 7–25)
CO2: 25 mmol/L (ref 20–32)
Calcium: 9.7 mg/dL (ref 8.6–10.3)
Chloride: 104 mmol/L (ref 98–110)
Creat: 0.97 mg/dL (ref 0.70–1.33)
GFR, Est African American: 104 mL/min/{1.73_m2} (ref >=60)
GFR, Est Non African American: 90 mL/min/{1.73_m2} (ref >=60)
Globulin: 2.6 g/dL (calc) (ref 1.9–3.7)
Glucose, Bld: 88 mg/dL (ref 65–99)
Potassium: 4.3 mmol/L (ref 3.5–5.3)
Sodium: 139 mmol/L (ref 135–146)
Total Bilirubin: 0.5 mg/dL (ref 0.2–1.2)
Total Protein: 7.3 g/dL (ref 6.1–8.1)

## 2020-11-12 LAB — LIPID PANEL
Cholesterol: 126 mg/dL (ref <200)
HDL: 38 mg/dL — ABNORMAL LOW (ref >=40)
LDL Cholesterol (Calc): 66 mg/dL (calc)
Non-HDL Cholesterol (Calc): 88 mg/dL (calc) (ref <130)
Total CHOL/HDL Ratio: 3.3 (calc) (ref <5.0)
Triglycerides: 136 mg/dL (ref <150)

## 2020-11-12 LAB — PSA: PSA: 0.57 ng/mL (ref <=4.00)

## 2020-11-12 LAB — MICROALBUMIN, URINE: Microalb, Ur: 0.7 mg/dL

## 2020-11-12 LAB — HEMOGLOBIN A1C
Hgb A1c MFr Bld: 6.1 % of total Hgb — ABNORMAL HIGH (ref <5.7)
Mean Plasma Glucose: 128 mg/dL
eAG (mmol/L): 7.1 mmol/L

## 2020-11-16 ENCOUNTER — Encounter: Payer: Self-pay | Admitting: Gastroenterology

## 2020-11-20 ENCOUNTER — Telehealth: Payer: Self-pay | Admitting: Podiatry

## 2020-11-20 NOTE — Telephone Encounter (Signed)
Orthotics in.. lvm for pt to call to schedule an appt to pick them up. °

## 2020-11-24 ENCOUNTER — Ambulatory Visit (INDEPENDENT_AMBULATORY_CARE_PROVIDER_SITE_OTHER): Payer: BC Managed Care – PPO | Admitting: *Deleted

## 2020-11-24 ENCOUNTER — Other Ambulatory Visit: Payer: Self-pay

## 2020-11-24 DIAGNOSIS — M778 Other enthesopathies, not elsewhere classified: Secondary | ICD-10-CM

## 2020-11-24 NOTE — Progress Notes (Signed)
Patient presents today to pick up custom molded foot orthotics, diagnosed with capsulitis by Dr. Paulla Dolly.   Orthotics were dispensed and fit was satisfactory. Reviewed instructions for break-in and wear. Written instructions given to patient.  Patient will follow up as needed.   [facility] - order # [id number]

## 2020-12-16 ENCOUNTER — Other Ambulatory Visit: Payer: Self-pay

## 2020-12-16 MED ORDER — GABAPENTIN 300 MG PO CAPS: 300.0000 mg | ORAL_CAPSULE | Freq: Three times a day (TID) | ORAL | 3 refills | Status: DC | PRN

## 2020-12-29 ENCOUNTER — Ambulatory Visit (AMBULATORY_SURGERY_CENTER): Payer: BC Managed Care – PPO | Admitting: *Deleted

## 2020-12-29 ENCOUNTER — Other Ambulatory Visit: Payer: Self-pay

## 2020-12-29 VITALS — Ht 70.0 in | Wt 285.0 lb

## 2020-12-29 DIAGNOSIS — Z1211 Encounter for screening for malignant neoplasm of colon: Secondary | ICD-10-CM

## 2020-12-29 MED ORDER — NA SULFATE-K SULFATE-MG SULF 17.5-3.13-1.6 GM/177ML PO SOLN: 1.0000 | Freq: Once | ORAL | 0 refills | Status: AC

## 2020-12-29 NOTE — Progress Notes (Signed)
No egg or soy allergy known to patient  No issues with past sedation with any surgeries or procedures Patient denies ever being told they had issues or difficulty with intubation  No FH of Malignant Hyperthermia No diet pills per patient No home 02 use per patient  No blood thinners per patient  Pt denies issues with constipation  No A fib or A flutter  EMMI video to pt or via MyChart  COVID 19 guidelines implemented in [facility] today with Pt and RN  Pt is fully vaccinated  for Covid    Discussed with pt there will be an out-of-pocket cost for prep and that varies from $0 to 70 dollars   Due to the COVID-19 pandemic we are asking patients to follow certain guidelines.  Pt aware of COVID protocols and [facility] guidelines   

## 2021-01-19 ENCOUNTER — Telehealth: Payer: Self-pay | Admitting: Gastroenterology

## 2021-01-19 DIAGNOSIS — Z1211 Encounter for screening for malignant neoplasm of colon: Secondary | ICD-10-CM

## 2021-01-19 MED ORDER — NA SULFATE-K SULFATE-MG SULF 17.5-3.13-1.6 GM/177ML PO SOLN: ORAL | 0 refills | Status: DC

## 2021-01-19 NOTE — Telephone Encounter (Signed)
Suprep Rx sent to patient's pharmacy per request.

## 2021-01-19 NOTE — Telephone Encounter (Signed)
Patient needs Prep call in to pharmacy.   Patients procedure is 01-25-21

## 2021-01-21 ENCOUNTER — Other Ambulatory Visit: Payer: Self-pay | Admitting: Family Medicine

## 2021-01-21 DIAGNOSIS — E781 Pure hyperglyceridemia: Secondary | ICD-10-CM

## 2021-01-25 ENCOUNTER — Encounter: Payer: Self-pay | Admitting: Gastroenterology

## 2021-01-25 ENCOUNTER — Other Ambulatory Visit: Payer: Self-pay

## 2021-01-25 ENCOUNTER — Ambulatory Visit (AMBULATORY_SURGERY_CENTER): Payer: BC Managed Care – PPO | Admitting: Gastroenterology

## 2021-01-25 VITALS — BP 100/72 | HR 71 | Temp 97.7°F | Resp 10 | Ht 70.0 in | Wt 285.0 lb

## 2021-01-25 DIAGNOSIS — D120 Benign neoplasm of cecum: Secondary | ICD-10-CM | POA: Diagnosis not present

## 2021-01-25 DIAGNOSIS — D127 Benign neoplasm of rectosigmoid junction: Secondary | ICD-10-CM | POA: Diagnosis not present

## 2021-01-25 DIAGNOSIS — K5730 Diverticulosis of large intestine without perforation or abscess without bleeding: Secondary | ICD-10-CM

## 2021-01-25 DIAGNOSIS — K635 Polyp of colon: Secondary | ICD-10-CM | POA: Diagnosis not present

## 2021-01-25 DIAGNOSIS — K621 Rectal polyp: Secondary | ICD-10-CM | POA: Diagnosis not present

## 2021-01-25 DIAGNOSIS — D124 Benign neoplasm of descending colon: Secondary | ICD-10-CM | POA: Diagnosis not present

## 2021-01-25 DIAGNOSIS — Z1211 Encounter for screening for malignant neoplasm of colon: Secondary | ICD-10-CM

## 2021-01-25 DIAGNOSIS — D128 Benign neoplasm of rectum: Secondary | ICD-10-CM

## 2021-01-25 DIAGNOSIS — D125 Benign neoplasm of sigmoid colon: Secondary | ICD-10-CM

## 2021-01-25 HISTORY — PX: COLONOSCOPY: SHX174

## 2021-01-25 MED ORDER — SODIUM CHLORIDE 0.9 % IV SOLN: 500.0000 mL | Freq: Once | INTRAVENOUS | Status: DC

## 2021-01-25 NOTE — Progress Notes (Signed)
To PACU, VSS. Report to [email or web address] 

## 2021-01-25 NOTE — Progress Notes (Signed)
Pt's states no medical or surgical changes since previsit or office visit.   CW vitals and EW IV.

## 2021-01-25 NOTE — Progress Notes (Signed)
Called to room to assist during endoscopic procedure.  Patient ID and intended procedure confirmed with present staff. Received instructions for my participation in the procedure from the performing physician.  

## 2021-01-25 NOTE — Patient Instructions (Signed)
Handouts given:  polyps, diverticulosis Resume previous diet Continue current medications Await pathology results  YOU HAD AN ENDOSCOPIC PROCEDURE TODAY AT [facility]:   Refer to the procedure report that was given to you for any specific questions about what was found during the examination.  If the procedure report does not answer your questions, please call your gastroenterologist to clarify.  If you requested that your care partner not be given the details of your procedure findings, then the procedure report has been included in a sealed envelope for you to review at your convenience later.  YOU SHOULD EXPECT: Some feelings of bloating in the abdomen. Passage of more gas than usual.  Walking can help get rid of the air that was put into your GI tract during the procedure and reduce the bloating. If you had a lower endoscopy (such as a colonoscopy or flexible sigmoidoscopy) you may notice spotting of blood in your stool or on the toilet paper. If you underwent a bowel prep for your procedure, you may not have a normal bowel movement for a few days.  Please Note:  You might notice some irritation and congestion in your nose or some drainage.  This is from the oxygen used during your procedure.  There is no need for concern and it should clear up in a day or so.  SYMPTOMS TO REPORT IMMEDIATELY:  Following lower endoscopy (colonoscopy or flexible sigmoidoscopy):  Excessive amounts of blood in the stool  Significant tenderness or worsening of abdominal pains  Swelling of the abdomen that is new, acute  Fever of 100F or higher  For urgent or emergent issues, a gastroenterologist can be reached at any hour by calling [phone]. Do not use MyChart messaging for urgent concerns.   DIET:  We do recommend a small meal at first, but then you may proceed to your regular diet.  Drink plenty of fluids but you should avoid alcoholic beverages for 24 hours.  ACTIVITY:  You should  plan to take it easy for the rest of today and you should NOT DRIVE or use heavy machinery until tomorrow (because of the sedation medicines used during the test).    FOLLOW UP: Our staff will call the number listed on your records 48-72 hours following your procedure to check on you and address any questions or concerns that you may have regarding the information given to you following your procedure. If we do not reach you, we will leave a message.  We will attempt to reach you two times.  During this call, we will ask if you have developed any symptoms of COVID 19. If you develop any symptoms (ie: fever, flu-like symptoms, shortness of breath, cough etc.) before then, please call [phone].  If you test positive for Covid 19 in the 2 weeks post procedure, please call and report this information to [place].    If any biopsies were taken you will be contacted by phone or by letter within the next 1-3 weeks.  Please call us at [phone] if you have not heard about the biopsies in 3 weeks.  SIGNATURES/CONFIDENTIALITY: You and/or your care partner have signed paperwork which will be entered into your electronic medical record.  These signatures attest to the fact that that the information above on your After Visit Summary has been reviewed and is understood.  Full responsibility of the confidentiality of this discharge information lies with you and/or your care-partner.

## 2021-01-25 NOTE — Op Note (Signed)
[facility] Patient Name: Cody Curry Procedure Date: 01/25/2021 8:29 AM MRN: [id number] Endoscopist: Gerrit Heck , MD Age: 50 Referring MD:  Date of Birth: 1971-05-27 Gender: Male Account #: [id number] Procedure:                Colonoscopy Indications:              Screening for colorectal malignant neoplasm, This                            is the patient's first colonoscopy Medicines:                Monitored Anesthesia Care Procedure:                Pre-Anesthesia Assessment:                           - Prior to the procedure, a History and Physical                            was performed, and patient medications and                            allergies were reviewed. The patient's tolerance of                            previous anesthesia was also reviewed. The risks                            and benefits of the procedure and the sedation                            options and risks were discussed with the patient.                            All questions were answered, and informed consent                            was obtained. Prior Anticoagulants: The patient has                            taken no previous anticoagulant or antiplatelet                            agents. ASA Grade Assessment: III - A patient with                            severe systemic disease. After reviewing the risks                            and benefits, the patient was deemed in                            satisfactory condition to undergo the procedure.  After obtaining informed consent, the colonoscope                            was passed under direct vision. Throughout the                            procedure, the patient's blood pressure, pulse, and                            oxygen saturations were monitored continuously. The                            Colonoscope was introduced through the anus and                            advanced to the the cecum,  identified by                            appendiceal orifice and ileocecal valve. The                            colonoscopy was performed without difficulty. The                            patient tolerated the procedure well. The quality                            of the bowel preparation was good. The ileocecal                            valve, appendiceal orifice, and rectum were                            photographed. Scope In: 8:41:01 AM Scope Out: 9:01:04 AM Scope Withdrawal Time: 0 hours 18 minutes 5 seconds  Total Procedure Duration: 0 hours 20 minutes 3 seconds  Findings:                 The perianal and digital rectal examinations were                            normal.                           Two sessile polyps were found in the descending                            colon and cecum. The polyps were 3 to 6 mm in size.                            These polyps were removed with a cold snare.                            Resection and retrieval were complete. Estimated  blood loss was minimal.                           Seven sessile polyps were found in the rectum (2)                            and distal sigmoid colon (5). The polyps were 1 to                            3 mm in size. These polyps were removed with a cold                            snare. Resection and retrieval were complete.                            Estimated blood loss was minimal.                           A few small-mouthed diverticula were found in the                            sigmoid colon and ascending colon.                           The retroflexed view of the distal rectum and anal                            verge was normal and showed no anal or rectal                            abnormalities. Complications:            No immediate complications. Estimated Blood Loss:     Estimated blood loss was minimal. Impression:               - Two 3 to 6 mm polyps in the  descending colon and                            in the cecum, removed with a cold snare. Resected                            and retrieved.                           - Seven 1 to 3 mm polyps in the rectum and in the                            distal sigmoid colon, removed with a cold snare.                            Resected and retrieved.                           - Diverticulosis in the sigmoid colon  and in the                            ascending colon.                           - The distal rectum and anal verge are normal on                            retroflexion view. Recommendation:           - Patient has a contact number available for                            emergencies. The signs and symptoms of potential                            delayed complications were discussed with the                            patient. Return to normal activities tomorrow.                            Written discharge instructions were provided to the                            patient.                           - Resume previous diet.                           - Continue present medications.                           - Await pathology results.                           - Repeat colonoscopy for surveillance based on                            pathology results.                           - Return to GI office PRN. Gerrit Heck, MD 01/25/2021 9:05:37 AM

## 2021-01-27 ENCOUNTER — Telehealth: Payer: Self-pay | Admitting: *Deleted

## 2021-01-27 NOTE — Telephone Encounter (Signed)
  Follow up Call-  Call back number 01/25/2021  Post procedure Call Back phone  # [phone]  Permission to leave phone message Yes  Some recent data might be hidden     Patient questions:  Do you have a fever, pain , or abdominal swelling? No. Pain Score  0 *  Have you tolerated food without any problems? Yes.    Have you been able to return to your normal activities? Yes.    Do you have any questions about your discharge instructions: Diet   No. Medications  No. Follow up visit  No.  Do you have questions or concerns about your Care? No.  Actions: * If pain score is 4 or above: No action needed, pain <4.Have you developed a fever since your procedure? no  2.   Have you had an respiratory symptoms (SOB or cough) since your procedure? no  3.   Have you tested positive for COVID 19 since your procedure no  4.   Have you had any family members/close contacts diagnosed with the COVID 19 since your procedure?  no   If yes to any of these questions please route to Joylene John, RN and Joella Prince, RN

## 2021-01-29 ENCOUNTER — Encounter: Payer: Self-pay | Admitting: Gastroenterology

## 2021-02-09 ENCOUNTER — Telehealth: Payer: Self-pay

## 2021-02-09 MED ORDER — NIRMATRELVIR/RITONAVIR (PAXLOVID)TABLET: 3.0000 | ORAL_TABLET | Freq: Two times a day (BID) | ORAL | 0 refills | Status: AC

## 2021-02-09 NOTE — Telephone Encounter (Signed)
Pt called in stating that he tested positive for covid, and that because he is a diabetic, pt just wanted to know is there anything that he should know or take for medication. Please advise.  Cb#: [phone]

## 2021-02-09 NOTE — Telephone Encounter (Signed)
Patient tested positive for COVID on 02/09/2021 with home test.   Sx include cough, body aches, fatigue, fever.  Advised to continue symptom management with OTC medications: Tylenol/ Ibuprofen for fever/ body aches, Mucinex/ Delsym for cough/ chest congestion, Afrin/Sudafed/nasal saline for sinus pressure/ nasal congestion  GFR  90. Order for Paxlovid sent to pharmacy. Advised possible SE include nausea, diarrhea, loss of taste and hypertension.  If chest pain, shortness of breath, fever >104 that is unresponsive to antipyretics noted, or if unable to tolerate fluids, advised to go to ER for evaluation.   Advised that the CDC recommends the following criteria prior to ending isolation in vaccinated persons at least 5 days since symptoms onset, then 5 days of masking AND 3 days fever free without antipyretics (Tylenol or Ibuprofen) AND improvement in respiratory symptoms.

## 2021-02-16 ENCOUNTER — Telehealth: Payer: Self-pay

## 2021-02-16 NOTE — Telephone Encounter (Signed)
Call placed to patient. No answer. No VM.  

## 2021-02-16 NOTE — Telephone Encounter (Signed)
Pt called in asking for some advice about his lingering cough since testing positive for COVID. Pt states that fevers have subsided along with other symptoms as well. Pt just wanted to know if he could get something sent to pharmacy for cough. Please advise.  Cb#: [phone]

## 2021-02-18 NOTE — Telephone Encounter (Signed)
Call placed to patient. No answer. No VM.  

## 2021-02-19 NOTE — Telephone Encounter (Signed)
Multiple calls placed to patient with no answer and no return call.   Message to be closed.  

## 2021-05-10 ENCOUNTER — Telehealth: Payer: Self-pay

## 2021-05-10 NOTE — Telephone Encounter (Signed)
Pt called in wanting to speak with the nurse/and or dr. Abbott Pao stated he had a fall over the weekend and is having some severe back pain. Please advise.  Cb#: [phone]

## 2021-05-10 NOTE — Telephone Encounter (Signed)
Call placed to patient.   Reports that he has had fall over weekend, and noted muscle pain in lower back. States that he has been using heat to treat.   Advised to use RICE method and F/U in office. Appointment scheduled.

## 2021-05-12 ENCOUNTER — Encounter: Payer: Self-pay | Admitting: Nurse Practitioner

## 2021-05-12 ENCOUNTER — Other Ambulatory Visit: Payer: Self-pay

## 2021-05-12 ENCOUNTER — Ambulatory Visit: Payer: BC Managed Care – PPO | Admitting: Nurse Practitioner

## 2021-05-12 DIAGNOSIS — M545 Low back pain, unspecified: Secondary | ICD-10-CM | POA: Diagnosis not present

## 2021-05-12 MED ORDER — CYCLOBENZAPRINE HCL 10 MG PO TABS: 10.0000 mg | ORAL_TABLET | Freq: Three times a day (TID) | ORAL | 0 refills | Status: DC | PRN

## 2021-05-12 NOTE — Progress Notes (Signed)
Subjective:    Patient ID: Cody Curry, male    DOB: [birth year], 50 y.o.   MRN: [id number]  HPI: Cody Curry is a 50 y.o. male presenting for back pain  Chief Complaint  Patient presents with   Back Pain    S/p fall 05/08/21   BACK PAIN Patient reports he was showering and he slipped and fell onto his left side while washing his leg.  He laid on the floor for a while before he got up but was able to get him and move all extremities after the fall.  Reports the pain has improved overall since it started.    Duration: day Mechanism of injury: fall 05/08/21 Location: upper left glutea and radiates to the right side of back Onset: gradual Severity: 5/10 Quality: dull Frequency: intermittent  Radiation: to the right side of back Aggravating factors: lifting, laying down or sitting for too long, getting in/out of van, laying on that side Alleviating factors: Tylenol/ibuprofen, gabapentin, heat, staying active/loose  Status: better Treatments attempted:  Tylenol/ibuprofen Relief with NSAIDs?: no Nighttime pain:  no Paresthesias / decreased sensation:  no Bowel / bladder incontinence:  no Fevers:  no Dysuria / urinary frequency:  no  No Known Allergies  Outpatient Encounter Medications as of 05/12/2021  Medication Sig   aspirin EC 81 MG tablet Take 81 mg by mouth daily. Swallow whole.   atorvastatin (LIPITOR) 40 MG tablet TAKE 1 TABLET BY MOUTH EVERY DAY   gabapentin (NEURONTIN) 300 MG capsule Take 1 capsule (300 mg total) by mouth 3 (three) times daily as needed.   metFORMIN (GLUCOPHAGE) 500 MG tablet TAKE 1 TABLET BY MOUTH TWICE A DAY WITH A MEAL   TRULICITY 1.5 VH/8.4ON SOPN INJECT 1 PEN (1.5 MG) INTO THE SKIN ONCE A WEEK.   [DISCONTINUED] losartan (COZAAR) 50 MG tablet Take 1 tablet (50 mg total) by mouth daily.   [DISCONTINUED] pioglitazone (ACTOS) 30 MG tablet TAKE 1 TABLET BY MOUTH EVERY DAY   [DISCONTINUED] sildenafil (VIAGRA) 100 MG tablet Take 0.5-1 tablets  (50-100 mg total) by mouth daily as needed for erectile dysfunction.   No facility-administered encounter medications on file as of 05/12/2021.    Patient Active Problem List   Diagnosis Date Noted   Hypertriglyceridemia 10/05/2018   Class 3 severe obesity without serious comorbidity with body mass index (BMI) of 40.0 to 44.9 in adult [facility]) 10/05/2018   Diabetes mellitus type 2 in obese ([place]) 10/05/2018   HTN (hypertension) 10/05/2018   Hyperglycemia 03/28/2017   Diarrhea 03/28/2017   Controlled diabetes mellitus type 2 with complications ([place]) 62/95/2841    Past Medical History:  Diagnosis Date   Diabetes mellitus type 2 in obese ([place])    Diabetic ketoacidosis without coma associated with type 2 diabetes mellitus ([place])    DKA, type 2 ([place]) 03/28/2017   Hypertriglyceridemia    Neuromuscular disorder ([place])     Relevant past medical, surgical, family and social history reviewed and updated as indicated. Interim medical history since our last visit reviewed.  Review of Systems Per HPI unless specifically indicated above     Objective:    BP 128/88 (BP Location: Left Arm, Patient Position: Sitting)   Pulse 76   Temp 98.7 F (37.1 C) (Oral)   Resp 18   Ht 5\' 10"  (1.778 m)   Wt 260 lb (117.9 kg)   SpO2 96%   BMI 37.31 kg/m   Wt Readings from Last 3 Encounters:  05/12/21 260 lb (  117.9 kg)  01/25/21 285 lb (129.3 kg)  12/29/20 285 lb (129.3 kg)    Physical Exam Vitals and nursing note reviewed.  Constitutional:      General: He is not in acute distress.    Appearance: Normal appearance. He is not toxic-appearing.  HENT:     Head: Normocephalic and atraumatic.  Cardiovascular:     Rate and Rhythm: Normal rate.     Heart sounds: Normal heart sounds. No murmur heard. Pulmonary:     Effort: Pulmonary effort is normal. No respiratory distress.     Breath sounds: Normal breath sounds. No wheezing, rhonchi or rales.  Musculoskeletal:        General: Normal range of  motion.     Lumbar back: Normal. No swelling, tenderness or bony tenderness. Normal range of motion. Negative right straight leg raise test and negative left straight leg raise test.       Back:     Right lower leg: No edema.     Left lower leg: No edema.     Comments: Tenderness to deep palpation  Skin:    General: Skin is warm and dry.     Capillary Refill: Capillary refill takes less than 2 seconds.     Coloration: Skin is not jaundiced or pale.     Findings: No erythema.  Neurological:     Mental Status: He is alert and oriented to person, place, and time.     Coordination: Coordination normal.     Gait: Gait normal.  Psychiatric:        Mood and Affect: Mood normal.        Behavior: Behavior normal.        Thought Content: Thought content normal.        Judgment: Judgment normal.      Assessment & Plan:  1. Acute left-sided low back pain without sciatica Acute.  I suspect some deep muscle injury/strain from fall.  No masses or ecchymosis on examination today.  Discussed back exercises/stretching.  Start Flexeril 10 mg qhs  and up to three times daily during day if it does not make him drowsy.  Do not drive or operative heavy machinery while taking this medication if it makes him drowsy.  Acute back pain should improve within 6 weeks, return to clinic if pain persists or worsens.  - cyclobenzaprine (FLEXERIL) 10 MG tablet; Take 1 tablet (10 mg total) by mouth 3 (three) times daily as needed for muscle spasms.  Dispense: 30 tablet; Refill: 0    Follow up plan: No follow-ups on file.

## 2021-05-19 ENCOUNTER — Other Ambulatory Visit: Payer: Self-pay | Admitting: Family Medicine

## 2021-06-18 MED ORDER — GABAPENTIN 300 MG PO CAPS: 300.0000 mg | ORAL_CAPSULE | Freq: Three times a day (TID) | ORAL | 2 refills | Status: DC | PRN

## 2021-06-18 NOTE — Addendum Note (Signed)
Addended by: Jaynie Crumble on: 06/18/2021 11:40 AM   Modules accepted: Orders

## 2021-06-24 ENCOUNTER — Encounter: Payer: Self-pay | Admitting: Family Medicine

## 2021-06-24 ENCOUNTER — Other Ambulatory Visit: Payer: Self-pay

## 2021-06-24 ENCOUNTER — Ambulatory Visit: Payer: BC Managed Care – PPO | Admitting: Family Medicine

## 2021-06-24 VITALS — BP 128/78 | HR 80 | Resp 18 | Ht 70.0 in | Wt 260.0 lb

## 2021-06-24 DIAGNOSIS — E1169 Type 2 diabetes mellitus with other specified complication: Secondary | ICD-10-CM | POA: Diagnosis not present

## 2021-06-24 DIAGNOSIS — I10 Essential (primary) hypertension: Secondary | ICD-10-CM

## 2021-06-24 DIAGNOSIS — E781 Pure hyperglyceridemia: Secondary | ICD-10-CM | POA: Diagnosis not present

## 2021-06-24 MED ORDER — MELOXICAM 15 MG PO TABS: 15.0000 mg | ORAL_TABLET | Freq: Every day | ORAL | 0 refills | Status: DC

## 2021-06-24 NOTE — Progress Notes (Signed)
Subjective:    Patient ID: Cody Curry, male    DOB: 1970/12/10, 50 y.o.   MRN: [id number]  HPI   Patient is a very pleasant 50 year old Caucasian male who presents today for a follow-up of his diabetes.  He continues to follow a strict diet and low-carb diet. Wt Readings from Last 3 Encounters:  06/24/21 260 lb (117.9 kg)  05/12/21 260 lb (117.9 kg)  01/25/21 285 lb (129.3 kg)   He continues to maintain his weight loss.  I congratulated him on this.  As result, his knee pain is much better.  He states that his neuropathy is doing better although he continues to have numbness and tingling in his feet.  Diabetic foot exam was performed today and shows diminished sensation to 10 g monofilament bilaterally but excellent pulses and no deformities are seen.  He denies any chest pain shortness of breath or dyspnea on exertion.  He is not currently on an ACE or an ARB due to orthostatic hypotension.  He has been dealing with back pain recently.  He denies any sciatica.  The pain is in the right and left flank.  It is unrelated to urination.  He denies any hematuria  Past Medical History:  Diagnosis Date   Diabetes mellitus type 2 in obese [facility])    Diabetic ketoacidosis without coma associated with type 2 diabetes mellitus ([place])    DKA, type 2 ([place]) 03/28/2017   Hypertriglyceridemia    Neuromuscular disorder [facility])    Past Surgical History:  Procedure Laterality Date   COLONOSCOPY  01/25/2021   [name] at [place]     Current Outpatient Medications on File Prior to Visit  Medication Sig Dispense Refill   aspirin EC 81 MG tablet Take 81 mg by mouth daily. Swallow whole.     atorvastatin (LIPITOR) 40 MG tablet TAKE 1 TABLET BY MOUTH EVERY DAY 90 tablet 3   gabapentin (NEURONTIN) 300 MG capsule Take 1 capsule (300 mg total) by mouth 3 (three) times daily as needed. 90 capsule 2   metFORMIN (GLUCOPHAGE) 500 MG tablet TAKE 1 TABLET BY MOUTH TWICE A DAY WITH MEALS  245 tablet 1   TRULICITY 1.5 YK/9.9IP SOPN INJECT 1 PEN (1.5 MG) INTO THE SKIN ONCE A WEEK. 2 mL 11   No current facility-administered medications on file prior to visit.   No Known Allergies Social History   Socioeconomic History   Marital status: Single    Spouse name: Not on file   Number of children: Not on file   Years of education: Not on file   Highest education level: Not on file  Occupational History   Not on file  Tobacco Use   Smoking status: Former    Packs/day: 1.50    Types: Cigarettes   Smokeless tobacco: Never  Substance and Sexual Activity   Alcohol use: No    Alcohol/week: 0.0 standard drinks    Comment: Very occasional   Drug use: Yes    Types: Marijuana    Comment: quit one year ago   Sexual activity: Not on file  Other Topics Concern   Not on file  Social History Narrative   Not on file   Social Determinants of Health   Financial Resource Strain: Not on file  Food Insecurity: Not on file  Transportation Needs: Not on file  Physical Activity: Not on file  Stress: Not on file  Social Connections: Not on file  Intimate Partner  Violence: Not on file      Review of Systems  All other systems reviewed and are negative.     Objective:   Physical Exam Vitals reviewed.  Constitutional:      Appearance: He is obese.  Cardiovascular:     Rate and Rhythm: Normal rate and regular rhythm.     Pulses: Normal pulses.     Heart sounds: Normal heart sounds.  Pulmonary:     Effort: Pulmonary effort is normal.     Breath sounds: Normal breath sounds.  Neurological:     Mental Status: He is alert.          Assessment & Plan:  Controlled type 2 diabetes mellitus with other specified complication, without long-term current use of insulin (HCC) - Plan: Hemoglobin A1c, Microalbumin, urine, CBC with Differential/Platelet, COMPLETE METABOLIC PANEL WITH GFR, Lipid panel  Primary hypertension  Hypertriglyceridemia Diabetic foot exam was performed  today and shows diabetic neuropathy.  However blood pressure is excellent.  I congratulated the patient's on maintaining his weight loss and I encouraged him to be consistent with his diet and exercise.  I will check an A1c.  Goal A1c is less than 6.5.  I will also check an LDL cholesterol.  Goal LDL cholesterol is less than 100.  Patient is currently taking a statin.  However he is not on an ACE inhibitor due to orthostatic hypotension.  I will check a urine microalbumin and if elevated will start the patient back on a low-dose ACE inhibitor.  Offered the patient a flu shot and he declined.

## 2021-06-25 LAB — MICROALBUMIN, URINE: Microalb, Ur: 0.4 mg/dL

## 2021-06-25 LAB — LIPID PANEL
Cholesterol: 141 mg/dL (ref <200)
HDL: 41 mg/dL (ref >=40)
LDL Cholesterol (Calc): 74 mg/dL (calc)
Non-HDL Cholesterol (Calc): 100 mg/dL (calc) (ref <130)
Total CHOL/HDL Ratio: 3.4 (calc) (ref <5.0)
Triglycerides: 162 mg/dL — ABNORMAL HIGH (ref <150)

## 2021-06-25 LAB — CBC WITH DIFFERENTIAL/PLATELET
Absolute Monocytes: 797 cells/uL (ref 200–950)
Basophils Absolute: 71 cells/uL (ref 0–200)
Basophils Relative: 0.6 %
Eosinophils Absolute: 179 cells/uL (ref 15–500)
Eosinophils Relative: 1.5 %
HCT: 48.5 % (ref 38.5–50.0)
Hemoglobin: 16.4 g/dL (ref 13.2–17.1)
Lymphs Abs: 2737 cells/uL (ref 850–3900)
MCH: 30.8 pg (ref 27.0–33.0)
MCHC: 33.8 g/dL (ref 32.0–36.0)
MCV: 91.2 fL (ref 80.0–100.0)
MPV: 10.7 fL (ref 7.5–12.5)
Monocytes Relative: 6.7 %
Neutro Abs: 8116 cells/uL — ABNORMAL HIGH (ref 1500–7800)
Neutrophils Relative %: 68.2 %
Platelets: 238 10*3/uL (ref 140–400)
RBC: 5.32 10*6/uL (ref 4.20–5.80)
RDW: 11.8 % (ref 11.0–15.0)
Total Lymphocyte: 23 %
WBC: 11.9 10*3/uL — ABNORMAL HIGH (ref 3.8–10.8)

## 2021-06-25 LAB — COMPLETE METABOLIC PANEL WITH GFR
AG Ratio: 1.6 (calc) (ref 1.0–2.5)
ALT: 15 U/L (ref 9–46)
AST: 18 U/L (ref 10–35)
Albumin: 4.6 g/dL (ref 3.6–5.1)
Alkaline phosphatase (APISO): 81 U/L (ref 35–144)
BUN: 14 mg/dL (ref 7–25)
CO2: 25 mmol/L (ref 20–32)
Calcium: 9.6 mg/dL (ref 8.6–10.3)
Chloride: 101 mmol/L (ref 98–110)
Creat: 0.9 mg/dL (ref 0.70–1.30)
Globulin: 2.9 g/dL (calc) (ref 1.9–3.7)
Glucose, Bld: 100 mg/dL — ABNORMAL HIGH (ref 65–99)
Potassium: 4.4 mmol/L (ref 3.5–5.3)
Sodium: 137 mmol/L (ref 135–146)
Total Bilirubin: 0.5 mg/dL (ref 0.2–1.2)
Total Protein: 7.5 g/dL (ref 6.1–8.1)
eGFR: 103 mL/min/{1.73_m2} (ref >=60)

## 2021-06-25 LAB — HEMOGLOBIN A1C
Hgb A1c MFr Bld: 6.2 % of total Hgb — ABNORMAL HIGH (ref <5.7)
Mean Plasma Glucose: 131 mg/dL
eAG (mmol/L): 7.3 mmol/L

## 2021-09-09 ENCOUNTER — Ambulatory Visit: Payer: BC Managed Care – PPO | Admitting: Family Medicine

## 2021-09-09 ENCOUNTER — Encounter: Payer: Self-pay | Admitting: Family Medicine

## 2021-09-09 ENCOUNTER — Other Ambulatory Visit: Payer: Self-pay

## 2021-09-09 ENCOUNTER — Telehealth: Payer: Self-pay | Admitting: Family Medicine

## 2021-09-09 VITALS — BP 118/82 | HR 71 | Temp 97.0°F | Resp 18 | Ht 70.0 in | Wt 270.0 lb

## 2021-09-09 DIAGNOSIS — J069 Acute upper respiratory infection, unspecified: Secondary | ICD-10-CM

## 2021-09-09 MED ORDER — NIRMATRELVIR/RITONAVIR (PAXLOVID)TABLET: 3.0000 | ORAL_TABLET | Freq: Two times a day (BID) | ORAL | 0 refills | Status: AC

## 2021-09-09 NOTE — Telephone Encounter (Signed)
Patient made aware of outstanding balance from visit in 2021. Requesting for bill to be resubmitted to [place] ? ?Id #:[id number] ? ?Please advise at [phone].  ?

## 2021-09-09 NOTE — Progress Notes (Signed)
? ?Subjective:  ? ? Patient ID: Cody Curry, male    DOB: Oct 11, 1970, 51 y.o.   MRN: [id number] ? ?Cough ?  ?Patient is a very pleasant 51 year old Caucasian gentleman who presents with 48-hour history of severe head congestion, rhinorrhea, postnasal drip, and coughing fits.  He reports subjective fevers and malaise.  He has not done a home COVID test.  He denies any chest pain shortness of breath. ?Past Medical History:  ?Diagnosis Date  ? Diabetes mellitus type 2 in obese [facility])   ? Diabetic ketoacidosis without coma associated with type 2 diabetes mellitus ([place])   ? DKA, type 2 ([place]) 03/28/2017  ? Hypertriglyceridemia   ? Neuromuscular disorder ([place])   ? ?Past Surgical History:  ?Procedure Laterality Date  ? COLONOSCOPY  01/25/2021  ? [name] at [facility]  ? REPLACEMENT TOTAL KNEE    ? ?Current Outpatient Medications on File Prior to Visit  ?Medication Sig Dispense Refill  ? aspirin EC 81 MG tablet Take 81 mg by mouth daily. Swallow whole.    ? atorvastatin (LIPITOR) 40 MG tablet TAKE 1 TABLET BY MOUTH EVERY DAY 90 tablet 3  ? gabapentin (NEURONTIN) 300 MG capsule Take 1 capsule (300 mg total) by mouth 3 (three) times daily as needed. 90 capsule 2  ? meloxicam (MOBIC) 15 MG tablet Take 1 tablet (15 mg total) by mouth daily. 30 tablet 0  ? metFORMIN (GLUCOPHAGE) 500 MG tablet TAKE 1 TABLET BY MOUTH TWICE A DAY WITH MEALS 180 tablet 1  ? TRULICITY 1.5 BZ/1.6RC SOPN INJECT 1 PEN (1.5 MG) INTO THE SKIN ONCE A WEEK. 2 mL 11  ? ?No current facility-administered medications on file prior to visit.  ? ?No Known Allergies ?Social History  ? ?Socioeconomic History  ? Marital status: Single  ?  Spouse name: Not on file  ? Number of children: Not on file  ? Years of education: Not on file  ? Highest education level: Not on file  ?Occupational History  ? Not on file  ?Tobacco Use  ? Smoking status: Former  ?  Packs/day: 1.50  ?  Types: Cigarettes  ? Smokeless tobacco: Never  ?Substance and Sexual Activity  ? Alcohol use:  No  ?  Alcohol/week: 0.0 standard drinks  ?  Comment: Very occasional  ? Drug use: Yes  ?  Types: Marijuana  ?  Comment: quit one year ago  ? Sexual activity: Not on file  ?Other Topics Concern  ? Not on file  ?Social History Narrative  ? Not on file  ? ?Social Determinants of Health  ? ?Financial Resource Strain: Not on file  ?Food Insecurity: Not on file  ?Transportation Needs: Not on file  ?Physical Activity: Not on file  ?Stress: Not on file  ?Social Connections: Not on file  ?Intimate Partner Violence: Not on file  ? ? ? ? ?Review of Systems  ?Respiratory:  Positive for cough.   ?All other systems reviewed and are negative. ? ?   ?Objective:  ? Physical Exam ?Vitals reviewed.  ?Constitutional:   ?   Appearance: He is obese.  ?HENT:  ?   Right Ear: Tympanic membrane and ear canal normal.  ?   Left Ear: Tympanic membrane and ear canal normal.  ?   Nose: Congestion and rhinorrhea present.  ?   Mouth/Throat:  ?   Pharynx: No oropharyngeal exudate or posterior oropharyngeal erythema.  ?Eyes:  ?   Conjunctiva/sclera: Conjunctivae normal.  ?Cardiovascular:  ?   Rate  and Rhythm: Normal rate and regular rhythm.  ?   Pulses: Normal pulses.  ?   Heart sounds: Normal heart sounds.  ?Pulmonary:  ?   Effort: Pulmonary effort is normal. No respiratory distress.  ?   Breath sounds: Normal breath sounds. No wheezing, rhonchi or rales.  ?Lymphadenopathy:  ?   Cervical: No cervical adenopathy.  ?Neurological:  ?   Mental Status: He is alert.  ? ? ? ? ? ?   ?Assessment & Plan:  ?Viral upper respiratory tract infection - Plan: SARS-COV-2 RNA,(COVID-19) QUAL NAAT ?Patient has a viral upper respiratory infection.  Question is whether this is COVID or just a simple cold.  I sent off a COVID test but will not be back in 48 hours.  Patient will go home and take a COVID test.  If his COVID test is positive he can take Paxlovid 3 tablets twice daily for 5 days.  Otherwise I recommended he refrain from going to work tomorrow.  He can use  Mucinex DM for cough.  Can use Coricidin HBP for head congestion. ?

## 2021-09-10 LAB — SARS-COV-2 RNA,(COVID-19) QUALITATIVE NAAT: SARS CoV2 RNA: NOT DETECTED

## 2021-10-07 NOTE — Telephone Encounter (Signed)
After reviewing patients guarantor account I see that the outstanding balance of $106.57 is coming from DOS 01/17/20 is coming from a deductible amount. It was originally an outstanding balance of $156.57. I have called patient to advise him that it was filed to [facility] and that he was responsible for the outstanding balance. He verbalized understanding that he owed it but was a little concerned why he had such a high deductible.  ?

## 2021-11-22 ENCOUNTER — Other Ambulatory Visit: Payer: Self-pay

## 2021-11-22 DIAGNOSIS — E118 Type 2 diabetes mellitus with unspecified complications: Secondary | ICD-10-CM

## 2021-11-22 NOTE — Telephone Encounter (Signed)
Pharmacy faxed a refill request for ?TRULICITY 1.5 PF/7.9KW SOPN [[id number]]  ?  Order Details ?Dose, Route, Frequency: As Directed  ?Dispense Quantity: 2 mL Refills: 11   ?     ?Sig: INJECT 1 PEN (1.5 MG) INTO THE SKIN ONCE A WEEK.  ?     ?Start Date: 11/10/20 End Date: --  ?Written Date: 11/10/20 Expiration Date: 11/10/21  ?   ? ?

## 2021-11-23 MED ORDER — TRULICITY 1.5 MG/0.5ML ~~LOC~~ SOAJ: SUBCUTANEOUS | 2 refills | Status: DC

## 2021-11-23 NOTE — Telephone Encounter (Signed)
Requested Prescriptions  ?Pending Prescriptions Disp Refills  ?? Dulaglutide (TRULICITY) 1.5 IF/0.2DX SOPN 2 mL 11  ?  ? Endocrinology:  Diabetes - GLP-1 Receptor Agonists Passed - 11/22/2021  2:46 PM  ?  ?  Passed - HBA1C is between 0 and 7.9 and within 180 days  ?  Hgb A1c MFr Bld  ?Date Value Ref Range Status  ?06/24/2021 6.2 (H) <5.7 % of total Hgb Final  ?  Comment:  ?  For someone without known diabetes, a hemoglobin  ?A1c value between 5.7% and 6.4% is consistent with ?prediabetes and should be confirmed with a  ?follow-up test. ?. ?For someone with known diabetes, a value <7% ?indicates that their diabetes is well controlled. A1c ?targets should be individualized based on duration of ?diabetes, age, comorbid conditions, and other ?considerations. ?. ?This assay result is consistent with an increased risk ?of diabetes. ?. ?Currently, no consensus exists regarding use of ?hemoglobin A1c for diagnosis of diabetes for children. ?. ?  ?   ?  ?  Passed - Valid encounter within last 6 months  ?  Recent Outpatient Visits   ?      ? 2 months ago Viral upper respiratory tract infection  ? [name], [name], MD  ? 5 months ago Controlled type 2 diabetes mellitus with other specified complication, without long-term current use of insulin ([place])  ? [name], [name], MD  ? 6 months ago Acute left-sided low back pain without sciatica  ? [place] Eulogio Bear, NP  ? 1 year ago Controlled type 2 diabetes mellitus with other specified complication, without long-term current use of insulin ([place])  ? [facility] [name], [name], MD  ? 1 year ago Left foot pain  ? [name], [name], MD  ?  ?  ? ?  ?  ?  ? ? ?

## 2021-11-24 ENCOUNTER — Other Ambulatory Visit: Payer: Self-pay | Admitting: Family Medicine

## 2021-11-25 NOTE — Telephone Encounter (Signed)
Requested Prescriptions  Pending Prescriptions Disp Refills  . metFORMIN (GLUCOPHAGE) 500 MG tablet [Pharmacy Med Name: METFORMIN HCL 500 MG TABS 500 Tablet] 180 tablet 0    Sig: TAKE 1 TABLET BY MOUTH TWICE A DAY WITH MEALS     Endocrinology:  Diabetes - Biguanides Passed - 11/24/2021  4:12 PM      Passed - Cr in normal range and within 360 days    Creat  Date Value Ref Range Status  06/24/2021 0.90 0.70 - 1.30 mg/dL Final         Passed - HBA1C is between 0 and 7.9 and within 180 days    Hgb A1c MFr Bld  Date Value Ref Range Status  06/24/2021 6.2 (H) <5.7 % of total Hgb Final    Comment:    For someone without known diabetes, a hemoglobin  A1c value between 5.7% and 6.4% is consistent with prediabetes and should be confirmed with a  follow-up test. . For someone with known diabetes, a value <7% indicates that their diabetes is well controlled. A1c targets should be individualized based on duration of diabetes, age, comorbid conditions, and other considerations. . This assay result is consistent with an increased risk of diabetes. . Currently, no consensus exists regarding use of hemoglobin A1c for diagnosis of diabetes for children. .          Passed - eGFR in normal range and within 360 days    GFR, Est African American  Date Value Ref Range Status  11/11/2020 104 > OR = 60 mL/min/1.86m2 Final   GFR, Est Non African American  Date Value Ref Range Status  11/11/2020 90 > OR = 60 mL/min/1.25m2 Final   eGFR  Date Value Ref Range Status  06/24/2021 103 > OR = 60 mL/min/1.53m2 Final    Comment:    The eGFR is based on the CKD-EPI 2021 equation. To calculate  the new eGFR from a previous Creatinine or Cystatin C result, go to https://www.kidney.org/professionals/ kdoqi/gfr%5Fcalculator          Passed - B12 Level in normal range and within 720 days    Vitamin B-12  Date Value Ref Range Status  01/17/2020 342 200 - 1,100 pg/mL Final    Comment:    . Please  Note: Although the reference range for vitamin B12 is [phone] pg/mL, it has been reported that between 5 and 10% of patients with values between 200 and 400 pg/mL may experience neuropsychiatric and hematologic abnormalities due to occult B12 deficiency; less than 1% of patients with values above 400 pg/mL will have symptoms. Renella Cunas - Valid encounter within last 6 months    Recent Outpatient Visits          2 months ago Viral upper respiratory tract infection   [place] Susy Frizzle, MD   5 months ago Controlled type 2 diabetes mellitus with other specified complication, without long-term current use of insulin ([place])   [place] Susy Frizzle, MD   6 months ago Acute left-sided low back pain without sciatica   [place] Eulogio Bear, NP   1 year ago Controlled type 2 diabetes mellitus with other specified complication, without long-term current use of insulin ([place])   [place] [name], [name], MD   1 year ago Left foot pain   [place] [name], [name], MD  Passed - CBC within normal limits and completed in the last 12 months    WBC  Date Value Ref Range Status  06/24/2021 11.9 (H) 3.8 - 10.8 Thousand/uL Final   RBC  Date Value Ref Range Status  06/24/2021 5.32 4.20 - 5.80 Million/uL Final   Hemoglobin  Date Value Ref Range Status  06/24/2021 16.4 13.2 - 17.1 g/dL Final   HCT  Date Value Ref Range Status  06/24/2021 48.5 38.5 - 50.0 % Final   MCHC  Date Value Ref Range Status  06/24/2021 33.8 32.0 - 36.0 g/dL Final   [facility]  Date Value Ref Range Status  06/24/2021 30.8 27.0 - 33.0 pg Final   MCV  Date Value Ref Range Status  06/24/2021 91.2 80.0 - 100.0 fL Final   No results found for: PLTCOUNTKUC, LABPLAT, POCPLA RDW  Date Value Ref Range Status  06/24/2021 11.8 11.0 - 15.0 % Final

## 2021-12-30 ENCOUNTER — Ambulatory Visit (INDEPENDENT_AMBULATORY_CARE_PROVIDER_SITE_OTHER): Payer: BC Managed Care – PPO | Admitting: Family Medicine

## 2021-12-30 VITALS — BP 132/86 | HR 80 | Temp 98.6°F | Resp 18 | Wt 281.0 lb

## 2021-12-30 DIAGNOSIS — E118 Type 2 diabetes mellitus with unspecified complications: Secondary | ICD-10-CM

## 2021-12-30 DIAGNOSIS — E781 Pure hyperglyceridemia: Secondary | ICD-10-CM

## 2021-12-30 MED ORDER — TRULICITY 1.5 MG/0.5ML ~~LOC~~ SOAJ: SUBCUTANEOUS | 3 refills | Status: DC

## 2021-12-30 MED ORDER — GABAPENTIN 300 MG PO CAPS: 300.0000 mg | ORAL_CAPSULE | Freq: Three times a day (TID) | ORAL | 3 refills | Status: DC

## 2021-12-30 MED ORDER — ATORVASTATIN CALCIUM 40 MG PO TABS: 40.0000 mg | ORAL_TABLET | Freq: Every day | ORAL | 3 refills | Status: DC

## 2021-12-30 MED ORDER — METFORMIN HCL 500 MG PO TABS: 500.0000 mg | ORAL_TABLET | Freq: Two times a day (BID) | ORAL | 3 refills | Status: DC

## 2021-12-30 NOTE — Progress Notes (Signed)
Subjective:    Patient ID: Cody Curry, male    DOB: 11-May-1971, 51 y.o.   MRN: [id number]  Patient is here today for a checkup of his diabetes.  He is currently taking Trulicity 1.5 mg subcu weekly.  He is on metformin twice daily.  His last A1c was in December was excellent at 6.2.  He is requesting 25-monthsupplies of his medication.  He is also on atorvastatin.  He denies any chest pain shortness of breath or dyspnea on exertion.  He does continue to have neuropathy in his legs which he treats with gabapentin 2-3 times daily.  Without the medication the nerve pain becomes severe. Past Medical History:  Diagnosis Date   Diabetes mellitus type 2 in obese ([facility]    Diabetic ketoacidosis without coma associated with type 2 diabetes mellitus ([place] [place]    [place], type 2 [place] [facility] 03[place]   Hypertriglyceridemia    Neuromuscular disorder ([facility]    Past Surgical History:  Procedure Laterality Date   COLONOS[facility]  [facility]/20[name]    Current Outpatient Medications on File Prior to Visit  Medication Sig Dispense Refill   aspirin EC 81 MG tablet Take 81 mg by mouth daily. Swallow whole.     No current facility-administered medications on file prior to visit.   No Known Allergies Social History   Socioeconomic History   Marital status: Single    Spouse name: Not on file   Number of children: Not on file   Years of education: Not on file   Highest education level: Not on file  Occupational History   Not on file  Tobacco Use   Smoking status: Former    Packs/day: 1.50    Types: Cigarettes   Smokeless tobacco: Never  Substance and Sexual Activity   Alcohol use: No    Alcohol/week: 0.0 standard drinks of alcohol    Comment: Very occasional   Drug use: Yes    Types: Marijuana    Comment: quit one year ago   Sexual activity: Not on file  Other Topics Concern   Not on file  Social History Narrative   Not on file   Social Determinants of  Health   Financial Resource Strain: Not on file  Food Insecurity: Not on file  Transportation Needs: Not on file  Physical Activity: Not on file  Stress: Not on file  Social Connections: Not on file  Intimate Partner Violence: Not on file      Review of Systems  All other systems reviewed and are negative.      Objective:   Physical Exam Vitals reviewed.  Constitutional:      Appearance: He is obese.  Cardiovascular:     Rate and Rhythm: Normal rate and regular rhythm.     Pulses: Normal pulses.     Heart sounds: Normal heart sounds.  Pulmonary:     Effort: Pulmonary effort is normal. No respiratory distress.     Breath sounds: Normal breath sounds. No wheezing, rhonchi or rales.  Lymphadenopathy:     Cervical: No cervical adenopathy.  Neurological:     Mental Status: He is alert.           Assessment & Plan:  Controlled type 2 diabetes mellitus with complication, without long-term current use of insulin ([place] - Plan: CBC with Differential/Platelet, Microalbumin, urine, COMPLETE METABOLIC PANEL WITH GFR, Hemoglobin A1c  Controlled diabetes mellitus type 2 with complications, unspecified  whether long term insulin use ([place]) - Plan: Dulaglutide (TRULICITY) 1.5 TZ/0.0FV SOPN  Hypertriglyceridemia - Plan: atorvastatin (LIPITOR) 40 MG tablet Blood pressure today is outstanding.  Check a hemoglobin A1c.  Goal hemoglobin A1c is less than 6.5.  Unable to check a fasting lipid panel given the fact the patient is not fasting.  However I will check a CBC and a CMP along with a urine microalbumin.  Goal albumin creatinine ratio is less than 30

## 2021-12-31 LAB — CBC WITH DIFFERENTIAL/PLATELET
Absolute Monocytes: 877 cells/uL (ref 200–950)
Basophils Absolute: 54 cells/uL (ref 0–200)
Basophils Relative: 0.5 %
Eosinophils Absolute: 161 cells/uL (ref 15–500)
Eosinophils Relative: 1.5 %
HCT: 45.6 % (ref 38.5–50.0)
Hemoglobin: 15.6 g/dL (ref 13.2–17.1)
Lymphs Abs: 2953 cells/uL (ref 850–3900)
MCH: 30.8 pg (ref 27.0–33.0)
MCHC: 34.2 g/dL (ref 32.0–36.0)
MCV: 90.1 fL (ref 80.0–100.0)
MPV: 10.7 fL (ref 7.5–12.5)
Monocytes Relative: 8.2 %
Neutro Abs: 6655 cells/uL (ref 1500–7800)
Neutrophils Relative %: 62.2 %
Platelets: 193 10*3/uL (ref 140–400)
RBC: 5.06 10*6/uL (ref 4.20–5.80)
RDW: 12.7 % (ref 11.0–15.0)
Total Lymphocyte: 27.6 %
WBC: 10.7 10*3/uL (ref 3.8–10.8)

## 2021-12-31 LAB — COMPLETE METABOLIC PANEL WITH GFR
AG Ratio: 1.7 (calc) (ref 1.0–2.5)
ALT: 23 U/L (ref 9–46)
AST: 21 U/L (ref 10–35)
Albumin: 4.7 g/dL (ref 3.6–5.1)
Alkaline phosphatase (APISO): 85 U/L (ref 35–144)
BUN: 16 mg/dL (ref 7–25)
CO2: 23 mmol/L (ref 20–32)
Calcium: 9.7 mg/dL (ref 8.6–10.3)
Chloride: 103 mmol/L (ref 98–110)
Creat: 0.95 mg/dL (ref 0.70–1.30)
Globulin: 2.7 g/dL (calc) (ref 1.9–3.7)
Glucose, Bld: 145 mg/dL — ABNORMAL HIGH (ref 65–99)
Potassium: 4.2 mmol/L (ref 3.5–5.3)
Sodium: 137 mmol/L (ref 135–146)
Total Bilirubin: 0.5 mg/dL (ref 0.2–1.2)
Total Protein: 7.4 g/dL (ref 6.1–8.1)
eGFR: 96 mL/min/{1.73_m2} (ref >=60)

## 2021-12-31 LAB — MICROALBUMIN, URINE: Microalb, Ur: 0.6 mg/dL

## 2021-12-31 LAB — HEMOGLOBIN A1C
Hgb A1c MFr Bld: 6.6 % of total Hgb — ABNORMAL HIGH (ref <5.7)
Mean Plasma Glucose: 143 mg/dL
eAG (mmol/L): 7.9 mmol/L

## 2022-04-15 ENCOUNTER — Ambulatory Visit: Payer: BC Managed Care – PPO | Admitting: Family Medicine

## 2022-04-15 VITALS — HR 84 | Ht 70.0 in | Wt 289.0 lb

## 2022-04-15 DIAGNOSIS — M25561 Pain in right knee: Secondary | ICD-10-CM

## 2022-04-15 DIAGNOSIS — F321 Major depressive disorder, single episode, moderate: Secondary | ICD-10-CM

## 2022-04-15 DIAGNOSIS — E1140 Type 2 diabetes mellitus with diabetic neuropathy, unspecified: Secondary | ICD-10-CM | POA: Diagnosis not present

## 2022-04-15 DIAGNOSIS — E118 Type 2 diabetes mellitus with unspecified complications: Secondary | ICD-10-CM

## 2022-04-15 DIAGNOSIS — I10 Essential (primary) hypertension: Secondary | ICD-10-CM | POA: Diagnosis not present

## 2022-04-15 MED ORDER — TIRZEPATIDE 7.5 MG/0.5ML ~~LOC~~ SOAJ: 7.5000 mg | SUBCUTANEOUS | 2 refills | Status: DC

## 2022-04-15 MED ORDER — SERTRALINE HCL 50 MG PO TABS: 50.0000 mg | ORAL_TABLET | Freq: Every day | ORAL | 3 refills | Status: DC

## 2022-04-15 NOTE — Progress Notes (Signed)
Subjective:    Patient ID: Cody Curry, male    DOB: 1970-10-08, 51 y.o.   MRN: [id number]  Patient presents today with multiple concerns.  First he has been out of Trulicity due to lack of insurance.  He has gained weight.  He is taking metformin.  He now has insurance back pain he is interested in switching Trulicity to [employer].  He is seeing sugars fluctuate between 120 and 220.  Second he reports worsening neuropathy in his feet.  Diabetic foot exam was performed today and shows normal pulses in both feet at the dorsalis pedis and posterior tibialis.  There is no erythema or lesions but he has burning and stinging pain in the plantar aspects of both feet on a daily basis.  Third he reports posterior right knee pain.  Occasionally he is having pain radiate from his right hip into his right knee but the majority time he is having sharp stabbing pain in his right knee when he bears weight on his right knee.  Fourth he is having carpal tunnel syndrome in both hands.  He reports burning and stinging pain in all 4 fingers except for his fifth digit in both hands left greater than right.  He has been diagnosed with carpal tunnel syndrome in the past.  Last he reports depression.  He reports anhedonia.  He states that he just wants to go home in reference to heaven.  He denies any suicidal thoughts.  He reports flying off the handle of his grandson for no reason.  This is out of character to him.  He reports feeling sad and not sleeping well. Past Medical History:  Diagnosis Date   Diabetes mellitus type 2 in obese [facility])    Diabetic ketoacidosis without coma associated with type 2 diabetes mellitus ([place])    DKA, type 2 ([place]) 03/28/2017   Hypertriglyceridemia    Neuromuscular disorder [facility])    Past Surgical History:  Procedure Laterality Date   COLONOSCOPY  01/25/2021   [name] at [place]     Current Outpatient Medications on File Prior to Visit  Medication Sig  Dispense Refill   aspirin EC 81 MG tablet Take 81 mg by mouth daily. Swallow whole.     atorvastatin (LIPITOR) 40 MG tablet Take 1 tablet (40 mg total) by mouth daily. 90 tablet 3   Dulaglutide (TRULICITY) 1.5 BP/1.0CH SOPN INJECT 1 PEN (1.5 MG) INTO THE SKIN ONCE A WEEK. 6 mL 3   gabapentin (NEURONTIN) 300 MG capsule Take 1 capsule (300 mg total) by mouth 3 (three) times daily. 270 capsule 3   metFORMIN (GLUCOPHAGE) 500 MG tablet Take 1 tablet (500 mg total) by mouth 2 (two) times daily with a meal. 180 tablet 3   No current facility-administered medications on file prior to visit.   No Known Allergies Social History   Socioeconomic History   Marital status: Single    Spouse name: Not on file   Number of children: Not on file   Years of education: Not on file   Highest education level: Not on file  Occupational History   Not on file  Tobacco Use   Smoking status: Former    Packs/day: 1.50    Types: Cigarettes   Smokeless tobacco: Never  Substance and Sexual Activity   Alcohol use: No    Alcohol/week: 0.0 standard drinks of alcohol    Comment: Very occasional   Drug use: Yes    Types:  Marijuana    Comment: quit one year ago   Sexual activity: Not on file  Other Topics Concern   Not on file  Social History Narrative   Not on file   Social Determinants of Health   Financial Resource Strain: Not on file  Food Insecurity: Not on file  Transportation Needs: Not on file  Physical Activity: Not on file  Stress: Not on file  Social Connections: Not on file  Intimate Partner Violence: Not on file      Review of Systems  All other systems reviewed and are negative.      Objective:   Physical Exam Vitals reviewed.  Constitutional:      Appearance: He is obese.  Cardiovascular:     Rate and Rhythm: Normal rate and regular rhythm.     Pulses: Normal pulses.     Heart sounds: Normal heart sounds.  Pulmonary:     Effort: Pulmonary effort is normal. No respiratory  distress.     Breath sounds: Normal breath sounds. No wheezing, rhonchi or rales.  Lymphadenopathy:     Cervical: No cervical adenopathy.  Neurological:     Mental Status: He is alert.           Assessment & Plan:  Controlled type 2 diabetes mellitus with complication, without long-term current use of insulin (HCC) - Plan: Microalbumin, urine, COMPLETE METABOLIC PANEL WITH GFR, Hemoglobin A1c  Primary hypertension  Acute pain of right knee - Plan: DG Knee Complete 4 Views Right  Type 2 diabetes mellitus with diabetic neuropathy, unspecified whether long term insulin use (HCC)  Current moderate episode of major depressive disorder without prior episode ([place]) For sure to try to treat depression with Zoloft 50 mg daily and reassess in 1 month or sooner if worse.  Second we will discontinue Trulicity and replace with Mounjaro 7.5 mg subcu weekly and uptitrate in 1 month if he is tolerating the medication.  Hopefully this will help with weight loss which may help with some of his musculoskeletal pain.  Refer the patient back to his hand specialist regarding his carpal tunnel syndrome however at the present time he declines treatment.  Fourth I would like to get an x-ray of his right knee but I suspect a meniscal tear versus osteoarthritis.  Last I recommended increasing gabapentin to 300 mg 3 times a day for neuropathy

## 2022-04-16 LAB — COMPLETE METABOLIC PANEL WITH GFR
AG Ratio: 1.6 (calc) (ref 1.0–2.5)
ALT: 37 U/L (ref 9–46)
AST: 29 U/L (ref 10–35)
Albumin: 4.6 g/dL (ref 3.6–5.1)
Alkaline phosphatase (APISO): 99 U/L (ref 35–144)
BUN: 17 mg/dL (ref 7–25)
CO2: 24 mmol/L (ref 20–32)
Calcium: 9.9 mg/dL (ref 8.6–10.3)
Chloride: 101 mmol/L (ref 98–110)
Creat: 1.05 mg/dL (ref 0.70–1.30)
Globulin: 2.9 g/dL (calc) (ref 1.9–3.7)
Glucose, Bld: 179 mg/dL — ABNORMAL HIGH (ref 65–99)
Potassium: 4.2 mmol/L (ref 3.5–5.3)
Sodium: 136 mmol/L (ref 135–146)
Total Bilirubin: 0.4 mg/dL (ref 0.2–1.2)
Total Protein: 7.5 g/dL (ref 6.1–8.1)
eGFR: 85 mL/min/{1.73_m2} (ref >=60)

## 2022-04-16 LAB — HEMOGLOBIN A1C
Hgb A1c MFr Bld: 8.2 % of total Hgb — ABNORMAL HIGH (ref <5.7)
Mean Plasma Glucose: 189 mg/dL
eAG (mmol/L): 10.4 mmol/L

## 2022-04-16 LAB — MICROALBUMIN, URINE: Microalb, Ur: 1.4 mg/dL

## 2022-04-19 ENCOUNTER — Inpatient Hospital Stay: Admit: 2022-04-19 | Payer: BC Managed Care – PPO

## 2022-04-19 ENCOUNTER — Encounter: Payer: Self-pay | Admitting: Family Medicine

## 2022-04-19 ENCOUNTER — Ambulatory Visit: Admit: 2022-04-19 | Payer: BC Managed Care – PPO

## 2022-04-19 ENCOUNTER — Ambulatory Visit
Admission: RE | Admit: 2022-04-19 | Discharge: 2022-04-19 | Disposition: A | Discharge status: Home / Self Care | Payer: BC Managed Care – PPO | Attending: Family Medicine | Admitting: Family Medicine

## 2022-04-19 ENCOUNTER — Ambulatory Visit
Admission: RE | Admit: 2022-04-19 | Discharge: 2022-04-19 | Disposition: A | Discharge status: Home / Self Care | Payer: BC Managed Care – PPO | Source: Ambulatory Visit | Attending: Family Medicine | Admitting: Family Medicine

## 2022-04-19 DIAGNOSIS — M25461 Effusion, right knee: Secondary | ICD-10-CM | POA: Diagnosis not present

## 2022-04-19 DIAGNOSIS — M25561 Pain in right knee: Secondary | ICD-10-CM | POA: Diagnosis not present

## 2022-04-19 DIAGNOSIS — M1711 Unilateral primary osteoarthritis, right knee: Secondary | ICD-10-CM | POA: Diagnosis not present

## 2022-04-22 ENCOUNTER — Other Ambulatory Visit: Payer: Self-pay

## 2022-04-22 DIAGNOSIS — M25561 Pain in right knee: Secondary | ICD-10-CM

## 2022-04-22 MED ORDER — MELOXICAM 15 MG PO TABS: 15.0000 mg | ORAL_TABLET | Freq: Every day | ORAL | 1 refills | Status: DC

## 2022-05-09 ENCOUNTER — Other Ambulatory Visit: Payer: Self-pay | Admitting: Family Medicine

## 2022-07-21 ENCOUNTER — Other Ambulatory Visit: Payer: Self-pay | Admitting: Family Medicine

## 2022-07-21 NOTE — Telephone Encounter (Signed)
Requested Prescriptions  Pending Prescriptions Disp Refills   sertraline (ZOLOFT) 50 MG tablet [Pharmacy Med Name: Sertraline HCl 50 MG Oral Tablet] 30 tablet 0    Sig: Take 1 tablet by mouth once daily     Psychiatry:  Antidepressants - SSRI - sertraline Failed - 07/21/2022  9:11 AM      Failed - Valid encounter within last 6 months    Recent Outpatient Visits           10 months ago Viral upper respiratory tract infection   [name], MD   1 year ago Controlled type 2 diabetes mellitus with other specified complication, without long-term current use of insulin ([place])   [place] Susy Frizzle, MD   1 year ago Acute left-sided low back pain without sciatica   [name], NP   1 year ago Controlled type 2 diabetes mellitus with other specified complication, without long-term current use of insulin ([place])   [place] [name], [name], MD   1 year ago Left foot pain   [name], [name], MD              Passed - AST in normal range and within 360 days    AST  Date Value Ref Range Status  04/15/2022 29 10 - 35 U/L Final         Passed - ALT in normal range and within 360 days    ALT  Date Value Ref Range Status  04/15/2022 37 9 - 46 U/L Final         Passed - Completed PHQ-2 or PHQ-9 in the last 360 days

## 2022-07-27 ENCOUNTER — Other Ambulatory Visit: Payer: Self-pay

## 2022-07-27 DIAGNOSIS — E118 Type 2 diabetes mellitus with unspecified complications: Secondary | ICD-10-CM

## 2022-07-27 MED ORDER — METFORMIN HCL 500 MG PO TABS: 500.0000 mg | ORAL_TABLET | Freq: Two times a day (BID) | ORAL | 3 refills | Status: DC

## 2022-08-03 ENCOUNTER — Telehealth: Payer: Self-pay

## 2022-08-03 NOTE — Telephone Encounter (Signed)
PRIOR AUTH FOR MONUJARO:  Marilynne Halsted (Key: [id number])   This request has received a Favorable outcome.  Please note any additional information provided by [facility] at the bottom of this request.

## 2022-08-12 ENCOUNTER — Encounter: Payer: Self-pay | Admitting: Family Medicine

## 2022-08-12 ENCOUNTER — Ambulatory Visit: Payer: Medicaid Other | Admitting: Family Medicine

## 2022-08-12 VITALS — BP 128/72 | HR 83 | Temp 97.9°F | Ht 70.0 in | Wt 267.0 lb

## 2022-08-12 DIAGNOSIS — E1140 Type 2 diabetes mellitus with diabetic neuropathy, unspecified: Secondary | ICD-10-CM

## 2022-08-12 DIAGNOSIS — F331 Major depressive disorder, recurrent, moderate: Secondary | ICD-10-CM | POA: Diagnosis not present

## 2022-08-12 DIAGNOSIS — I10 Essential (primary) hypertension: Secondary | ICD-10-CM

## 2022-08-12 MED ORDER — SERTRALINE HCL 100 MG PO TABS: 100.0000 mg | ORAL_TABLET | Freq: Every day | ORAL | 3 refills | Status: DC

## 2022-08-12 MED ORDER — GABAPENTIN 300 MG PO CAPS: 300.0000 mg | ORAL_CAPSULE | Freq: Three times a day (TID) | ORAL | 3 refills | Status: DC

## 2022-08-12 MED ORDER — CELECOXIB 200 MG PO CAPS: 200.0000 mg | ORAL_CAPSULE | Freq: Two times a day (BID) | ORAL | 11 refills | Status: DC

## 2022-08-12 NOTE — Progress Notes (Signed)
Subjective:    Patient ID: Cody Curry, male    DOB: January 30, 1971, 52 y.o.   MRN: [id number]  HgA1c was 8.2 in 10/23.  We hoped by adding mounjaro this would improve.  Here for recheck.   Wt Readings from Last 3 Encounters:  08/12/22 267 lb (121.1 kg)  04/15/22 289 lb (131.1 kg)  12/30/21 281 lb (127.5 kg)   Since I last saw the patient, he has lost more than 20 pounds.  He is having success with Mounjaro.  He does report nausea.  However that has improved with time.  He is still taking well on a daily basis for pain in his right knee.  We discussed the risk of ulcers from this however he cannot stop the medication due to the severity of the pain.  He is taking a proton pump inhibitor over-the-counter for stomach protection.  We discussed switching to Celebrex to reduce the risk of GI toxicity and he would like to try to do this.  Unfortunately he continues to battle depression.  He still has anhedonia.  Unfortunately he lost his job.  He is tolerating Zoloft 50 mg daily. Past Medical History:  Diagnosis Date   Diabetes mellitus type 2 in obese [facility])    Diabetic ketoacidosis without coma associated with type 2 diabetes mellitus ([place])    DKA, type 2 ([place]) 03/28/2017   Hypertriglyceridemia    Neuromuscular disorder [facility])    Past Surgical History:  Procedure Laterality Date   COLONOSCOPY  01/25/2021   [name] at [place]     Current Outpatient Medications on File Prior to Visit  Medication Sig Dispense Refill   aspirin EC 81 MG tablet Take 81 mg by mouth daily. Swallow whole.     atorvastatin (LIPITOR) 40 MG tablet Take 1 tablet (40 mg total) by mouth daily. 90 tablet 3   gabapentin (NEURONTIN) 300 MG capsule Take 1 capsule (300 mg total) by mouth 3 (three) times daily. 270 capsule 3   meloxicam (MOBIC) 15 MG tablet Take 1 tablet (15 mg total) by mouth daily. 30 tablet 1   metFORMIN (GLUCOPHAGE) 500 MG tablet Take 1 tablet (500 mg total) by mouth 2 (two)  times daily with a meal. 180 tablet 3   sertraline (ZOLOFT) 50 MG tablet Take 1 tablet (50 mg total) by mouth daily. 30 tablet 3   tirzepatide (MOUNJARO) 7.5 MG/0.5ML Pen Inject 7.5 mg into the skin once a week. 6 mL 2   No current facility-administered medications on file prior to visit.   No Known Allergies Social History   Socioeconomic History   Marital status: Single    Spouse name: Not on file   Number of children: Not on file   Years of education: Not on file   Highest education level: Not on file  Occupational History   Not on file  Tobacco Use   Smoking status: Former    Packs/day: 1.50    Types: Cigarettes   Smokeless tobacco: Never  Substance and Sexual Activity   Alcohol use: No    Alcohol/week: 0.0 standard drinks of alcohol    Comment: Very occasional   Drug use: Yes    Types: Marijuana    Comment: quit one year ago   Sexual activity: Not on file  Other Topics Concern   Not on file  Social History Narrative   Not on file   Social Determinants of Health   Financial Resource Strain: Not on  file  Food Insecurity: Not on file  Transportation Needs: Not on file  Physical Activity: Not on file  Stress: Not on file  Social Connections: Not on file  Intimate Partner Violence: Not on file      Review of Systems  All other systems reviewed and are negative.      Objective:   Physical Exam Vitals reviewed.  Constitutional:      Appearance: He is obese.  Cardiovascular:     Rate and Rhythm: Normal rate and regular rhythm.     Pulses: Normal pulses.     Heart sounds: Normal heart sounds.  Pulmonary:     Effort: Pulmonary effort is normal. No respiratory distress.     Breath sounds: Normal breath sounds. No wheezing, rhonchi or rales.  Lymphadenopathy:     Cervical: No cervical adenopathy.  Neurological:     Mental Status: He is alert.           Assessment & Plan:  Primary hypertension  Type 2 diabetes mellitus with diabetic neuropathy,  unspecified whether long term insulin use ([place]) - Plan: Hemoglobin A1c, COMPLETE METABOLIC PANEL WITH GFR, Protein / Creatinine Ratio, Urine, CANCELED: Lipid panel  Moderate episode of recurrent major depressive disorder ([place]) - Plan: Ambulatory referral to Psychology I am very proud of the patient for losing 20 pounds.  His blood pressure is excellent.  I will check an A1c today.  Ideally I like to see this below 6.5.  If his A1c is greater than 6.5 we can uptitrate Mounjaro or uptitrate metformin.  However the patient would like to maintain at the current dose due to the nausea he was experiencing.  Check a fasting lipid panel and the patient is fasting.  He is not fasting today.  Check a urine protein to creatinine ratio.  Increase Zoloft to 100 mg a day.  Also consult psychology if the patient feels that he would benefit from counseling.  Discontinue meloxicam and replace with Celebrex to reduce potential GI toxicity

## 2022-08-13 LAB — COMPLETE METABOLIC PANEL WITH GFR
AG Ratio: 1.5 (calc) (ref 1.0–2.5)
ALT: 24 U/L (ref 9–46)
AST: 25 U/L (ref 10–35)
Albumin: 4.6 g/dL (ref 3.6–5.1)
Alkaline phosphatase (APISO): 72 U/L (ref 35–144)
BUN: 13 mg/dL (ref 7–25)
CO2: 24 mmol/L (ref 20–32)
Calcium: 9.8 mg/dL (ref 8.6–10.3)
Chloride: 100 mmol/L (ref 98–110)
Creat: 1.04 mg/dL (ref 0.70–1.30)
Globulin: 3.1 g/dL (calc) (ref 1.9–3.7)
Glucose, Bld: 100 mg/dL — ABNORMAL HIGH (ref 65–99)
Potassium: 4.5 mmol/L (ref 3.5–5.3)
Sodium: 136 mmol/L (ref 135–146)
Total Bilirubin: 0.5 mg/dL (ref 0.2–1.2)
Total Protein: 7.7 g/dL (ref 6.1–8.1)
eGFR: 86 mL/min/{1.73_m2} (ref >=60)

## 2022-08-13 LAB — PROTEIN / CREATININE RATIO, URINE
Creatinine, Urine: 157 mg/dL (ref 20–320)
Protein/Creat Ratio: 51 mg/g creat (ref 25–148)
Protein/Creatinine Ratio: 0.051 mg/mg creat (ref 0.025–0.148)
Total Protein, Urine: 8 mg/dL (ref 5–25)

## 2022-08-13 LAB — HEMOGLOBIN A1C
Hgb A1c MFr Bld: 6.4 % of total Hgb — ABNORMAL HIGH (ref <5.7)
Mean Plasma Glucose: 137 mg/dL
eAG (mmol/L): 7.6 mmol/L

## 2022-09-02 ENCOUNTER — Encounter: Payer: Self-pay | Admitting: Family Medicine

## 2022-09-02 ENCOUNTER — Ambulatory Visit: Payer: Medicaid Other | Admitting: Family Medicine

## 2022-09-02 VITALS — BP 115/82 | HR 108 | Temp 97.6°F | Ht 70.0 in | Wt 251.0 lb

## 2022-09-02 DIAGNOSIS — R0602 Shortness of breath: Secondary | ICD-10-CM | POA: Diagnosis not present

## 2022-09-02 DIAGNOSIS — J069 Acute upper respiratory infection, unspecified: Secondary | ICD-10-CM | POA: Diagnosis not present

## 2022-09-02 MED ORDER — PREDNISONE 50 MG PO TABS: ORAL_TABLET | ORAL | 0 refills | Status: DC

## 2022-09-02 MED ORDER — ALBUTEROL SULFATE HFA 108 (90 BASE) MCG/ACT IN AERS: 2.0000 | INHALATION_SPRAY | RESPIRATORY_TRACT | 0 refills | Status: DC | PRN

## 2022-09-02 MED ORDER — METHYLPREDNISOLONE ACETATE 80 MG/ML IJ SUSP: 80.0000 mg | Freq: Once | INTRAMUSCULAR | Status: DC

## 2022-09-02 MED ORDER — IPRATROPIUM-ALBUTEROL 0.5-2.5 (3) MG/3ML IN SOLN: 3.0000 mL | Freq: Once | RESPIRATORY_TRACT | Status: DC

## 2022-09-02 NOTE — Progress Notes (Signed)
Acute Office Visit  Subjective:     Patient ID: Cody Curry, male    DOB: 1971-03-03, 52 y.o.   MRN: [id number]  Chief Complaint  Patient presents with   Acute Visit    * MASK**f/u on cough/fever (broke the other day)/diarrhea/vomiting/pressure on upper chest (sx left and returned night of 2/21) - JBG\\\ Per pt just can't breath/end with bad cough per may have passed out?      HPI Patient is in today for 10 days of ongoing shortness of breath, fatigue, started with fever, cough, vomiting and diarrhea for 2 days but those symtpoms have improved. He continues to experience shortness of breath. Believes he passed out last night when coughing on the porch. Denies chest pain. Was exposed to grandson. Has been taking nyquil and dayquil.  Review of Systems  All other systems reviewed and are negative.   Past Medical History:  Diagnosis Date   Diabetes mellitus type 2 in obese [facility])    Diabetic ketoacidosis without coma associated with type 2 diabetes mellitus ([place])    DKA, type 2 ([place]) 03/28/2017   Hypertriglyceridemia    Neuromuscular disorder [facility])    Past Surgical History:  Procedure Laterality Date   COLONOSCOPY  01/25/2021   [name] at [place]     Current Outpatient Medications on File Prior to Visit  Medication Sig Dispense Refill   atorvastatin (LIPITOR) 40 MG tablet Take 1 tablet (40 mg total) by mouth daily. 90 tablet 3   celecoxib (CELEBREX) 200 MG capsule Take 1 capsule (200 mg total) by mouth 2 (two) times daily. 60 capsule 11   gabapentin (NEURONTIN) 300 MG capsule Take 1 capsule (300 mg total) by mouth 3 (three) times daily. 270 capsule 3   metFORMIN (GLUCOPHAGE) 500 MG tablet Take 1 tablet (500 mg total) by mouth 2 (two) times daily with a meal. 180 tablet 3   sertraline (ZOLOFT) 100 MG tablet Take 1 tablet (100 mg total) by mouth daily. 90 tablet 3   tirzepatide (MOUNJARO) 7.5 MG/0.5ML Pen Inject 7.5 mg into the skin once a  week. 6 mL 2   No current facility-administered medications on file prior to visit.   No Known Allergies     Objective:    BP 115/82   Pulse (!) 108   Temp 97.6 F (36.4 C) (Oral)   Ht '5\' 10"'$  (1.778 m)   Wt 251 lb (113.9 kg)   SpO2 92%   BMI 36.01 kg/m    Physical Exam Vitals and nursing note reviewed.  Constitutional:      Appearance: Normal appearance. He is obese. He is ill-appearing and diaphoretic.  HENT:     Head: Normocephalic and atraumatic.  Cardiovascular:     Rate and Rhythm: Normal rate and regular rhythm.     Pulses: Normal pulses.     Heart sounds: Normal heart sounds.  Pulmonary:     Effort: Tachypnea and accessory muscle usage present.     Breath sounds: Decreased air movement present. Examination of the right-upper field reveals wheezing and rhonchi. Examination of the left-upper field reveals wheezing and rhonchi. Examination of the right-lower field reveals wheezing and rhonchi. Examination of the left-lower field reveals wheezing and rhonchi. Wheezing and rhonchi present.  Skin:    General: Skin is warm.     Capillary Refill: Capillary refill takes less than 2 seconds.  Neurological:     General: No focal deficit present.     Mental  Status: He is alert and oriented to person, place, and time. Mental status is at baseline.  Psychiatric:        Mood and Affect: Mood normal.        Behavior: Behavior normal.        Thought Content: Thought content normal.        Judgment: Judgment normal.     No results found for any visits on 09/02/22.      Assessment & Plan:   Problem List Items Addressed This Visit       Respiratory   Viral URI with cough - Primary    Patient has symptoms consistent with viral URI, he is currently having acute shortness of breath and wheezing on exam, Duoneb x2 given in office in addition to Depo-Medrol injection resulting in relief of symptoms. Will order Albuterol q4h 2 puffs and 7 days of Prednisone '50mg'$  with strict  instructions to proceed to ED for shortness of breath and wheezing not relieved by albuterol q4h.        Other   Shortness of breath   Relevant Medications   methylPREDNISolone acetate (DEPO-MEDROL) injection 80 mg (Start on 09/02/2022  5:00 PM)   ipratropium-albuterol (DUONEB) 0.5-2.5 (3) MG/3ML nebulizer solution 3 mL (Start on 09/02/2022  5:00 PM)   ipratropium-albuterol (DUONEB) 0.5-2.5 (3) MG/3ML nebulizer solution 3 mL (Start on 09/02/2022  5:00 PM)    Meds ordered this encounter  Medications   albuterol (VENTOLIN HFA) 108 (90 Base) MCG/ACT inhaler    Sig: Inhale 2 puffs into the lungs every 4 (four) hours as needed for wheezing or shortness of breath.    Dispense:  8 g    Refill:  0    Order Specific Question:   Supervising Provider    Answer:   Jenna Luo T [[id number]]   predniSONE (DELTASONE) 50 MG tablet    Sig: Take 1 tablet (50 mg) per day for 7 days    Dispense:  7 tablet    Refill:  0    Order Specific Question:   Supervising Provider    Answer:   Jenna Luo T [[id number]]   methylPREDNISolone acetate (DEPO-MEDROL) injection 80 mg   ipratropium-albuterol (DUONEB) 0.5-2.5 (3) MG/3ML nebulizer solution 3 mL   ipratropium-albuterol (DUONEB) 0.5-2.5 (3) MG/3ML nebulizer solution 3 mL    Return if symptoms worsen or fail to improve.  Rubie Maid, FNP

## 2022-09-02 NOTE — Assessment & Plan Note (Signed)
Patient has symptoms consistent with viral URI, he is currently having acute shortness of breath and wheezing on exam, Duoneb x2 given in office in addition to Depo-Medrol injection resulting in relief of symptoms. Will order Albuterol q4h 2 puffs and 7 days of Prednisone '50mg'$  with strict instructions to proceed to ED for shortness of breath and wheezing not relieved by albuterol q4h.

## 2022-10-25 ENCOUNTER — Ambulatory Visit: Payer: Medicaid Other | Admitting: Family Medicine

## 2022-10-25 ENCOUNTER — Encounter: Payer: Self-pay | Admitting: Family Medicine

## 2022-10-25 VITALS — BP 124/76 | HR 85 | Temp 98.4°F | Ht 70.0 in | Wt 254.0 lb

## 2022-10-25 DIAGNOSIS — M5431 Sciatica, right side: Secondary | ICD-10-CM

## 2022-10-25 DIAGNOSIS — S83281A Other tear of lateral meniscus, current injury, right knee, initial encounter: Secondary | ICD-10-CM

## 2022-10-25 NOTE — Progress Notes (Signed)
Subjective:    Patient ID: Cody Curry, male    DOB: [birth year], 52 y.o.   MRN: [id number]  Patient felt his knee pop.  This is his right knee.  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  He was standing Saturday when he felt a pop in the posterior lateral aspect of his knee.  All day Sunday and Monday he was unable to put weight on his knee.  After resting the pain is better however he still has pain in his right posterior lateral knee with prolonged standing.  There is no erythema today.  There is no effusion.  No locking or laxity to varus or valgus stress.  However the patient has pain with Apley grind.  He also reports periodic sciatica-like pain radiating down his right leg if he hyperextends his back.  He states that it depends on what ever position he is in he will occasionally get pain that shoots down his back starting in his right gluteus and radiating down his right leg into his foot Past Medical History:  Diagnosis Date   Diabetes mellitus type 2 in obese    Diabetic ketoacidosis without coma associated with type 2 diabetes mellitus    DKA, type 2 03/28/2017   Hypertriglyceridemia    Neuromuscular disorder    Past Surgical History:  Procedure Laterality Date   COLONOSCOPY  01/25/2021   [name] at [facility]   REPLACEMENT TOTAL KNEE     Current Outpatient Medications on File Prior to Visit  Medication Sig Dispense Refill   atorvastatin (LIPITOR) 40 MG tablet Take 1 tablet (40 mg total) by mouth daily. 90 tablet 3   celecoxib (CELEBREX) 200 MG capsule Take 1 capsule (200 mg total) by mouth 2 (two) times daily. 60 capsule 11   gabapentin (NEURONTIN) 300 MG capsule Take 1 capsule (300 mg total) by mouth 3 (three) times daily. 270 capsule 3   metFORMIN (GLUCOPHAGE) 500 MG tablet Take 1 tablet (500 mg total) by mouth 2 (two) times daily with a meal. 180 tablet 3   omega-3 acid ethyl esters (LOVAZA) 1 g capsule Take 2 capsules by mouth 2  (two) times daily.     predniSONE (DELTASONE) 50 MG tablet Take 1 tablet (50 mg) per day for 7 days 7 tablet 0   sertraline (ZOLOFT) 100 MG tablet Take 1 tablet (100 mg total) by mouth daily. 90 tablet 3   tirzepatide (MOUNJARO) 7.5 MG/0.5ML Pen Inject 7.5 mg into the skin once a week. 6 mL 2   albuterol (VENTOLIN HFA) 108 (90 Base) MCG/ACT inhaler Inhale 2 puffs into the lungs every 4 (four) hours as needed for wheezing or shortness of breath. (Patient not taking: Reported on 10/25/2022) 8 g 0   Current Facility-Administered Medications on File Prior to Visit  Medication Dose Route Frequency Provider Last Rate Last Admin   ipratropium-albuterol (DUONEB) 0.5-2.5 (3) MG/3ML nebulizer solution 3 mL  3 mL Nebulization Once Howard, [facility] S, FNP       ipratropium-albuterol (DUONEB) 0.5-2.5 (3) MG/3ML nebulizer solution 3 mL  3 mL Nebulization Once Kurtis Bushman S, FNP       methylPREDNISolone acetate (DEPO-MEDROL) injection 80 mg  80 mg Intramuscular Once Park Meo, FNP       No Known Allergies Social History   Socioeconomic History   Marital status: Single    Spouse name: Not on file   Number of children: Not on file  Years of education: Not on file   Highest education level: Not on file  Occupational History   Not on file  Tobacco Use   Smoking status: Former    Packs/day: 1.5    Types: Cigarettes   Smokeless tobacco: Never  Substance and Sexual Activity   Alcohol use: No    Alcohol/week: 0.0 standard drinks of alcohol    Comment: Very occasional   Drug use: Yes    Types: Marijuana    Comment: quit one year ago   Sexual activity: Not on file  Other Topics Concern   Not on file  Social History Narrative   Not on file   Social Determinants of Health   Financial Resource Strain: Not on file  Food Insecurity: Not on file  Transportation Needs: Not on file  Physical Activity: Not on file  Stress: Not on file  Social Connections: Not on file  Intimate Partner Violence:  Not on file      Review of Systems  All other systems reviewed and are negative.      Objective:   Physical Exam Vitals reviewed.  Constitutional:      Appearance: He is obese.  Cardiovascular:     Rate and Rhythm: Normal rate and regular rhythm.     Pulses: Normal pulses.     Heart sounds: Normal heart sounds.  Pulmonary:     Effort: Pulmonary effort is normal. No respiratory distress.     Breath sounds: Normal breath sounds. No wheezing, rhonchi or rales.  Musculoskeletal:     Right knee: No effusion or erythema. Decreased range of motion. Abnormal meniscus.  Lymphadenopathy:     Cervical: No cervical adenopathy.  Neurological:     Mental Status: He is alert.           Assessment & Plan:  Right sided sciatica - Plan: DG Lumbar Spine Complete  Tear of lateral meniscus of right knee, unspecified tear type, unspecified whether old or current tear, initial encounter I suspect a lateral meniscal tear.  We discussed a cortisone injection today and the patient states the pain is manageable as long as he is not walking on it.  Therefore I recommended rest ice elevation and compression.  I recommended taking Celebrex 200 mg twice daily.  Reassess in 2 weeks if no better or sooner if worsening.  He is having right-sided sciatica so we will get an x-ray of the lumbar spine to evaluate further

## 2022-10-28 ENCOUNTER — Emergency Department (HOSPITAL_BASED_OUTPATIENT_CLINIC_OR_DEPARTMENT_OTHER): Payer: Medicaid Other

## 2022-10-28 ENCOUNTER — Other Ambulatory Visit: Payer: Self-pay

## 2022-10-28 ENCOUNTER — Encounter (HOSPITAL_BASED_OUTPATIENT_CLINIC_OR_DEPARTMENT_OTHER): Payer: Self-pay | Admitting: Emergency Medicine

## 2022-10-28 ENCOUNTER — Emergency Department (HOSPITAL_BASED_OUTPATIENT_CLINIC_OR_DEPARTMENT_OTHER)
Admission: EM | Admit: 2022-10-28 | Discharge: 2022-10-28 | Disposition: A | Discharge status: Home / Self Care | Payer: Medicaid Other | Attending: Emergency Medicine | Admitting: Emergency Medicine

## 2022-10-28 DIAGNOSIS — Y9389 Activity, other specified: Secondary | ICD-10-CM | POA: Insufficient documentation

## 2022-10-28 DIAGNOSIS — Y990 Civilian activity done for income or pay: Secondary | ICD-10-CM | POA: Insufficient documentation

## 2022-10-28 DIAGNOSIS — M1711 Unilateral primary osteoarthritis, right knee: Secondary | ICD-10-CM | POA: Insufficient documentation

## 2022-10-28 DIAGNOSIS — S8991XA Unspecified injury of right lower leg, initial encounter: Secondary | ICD-10-CM | POA: Diagnosis present

## 2022-10-28 DIAGNOSIS — X58XXXA Exposure to other specified factors, initial encounter: Secondary | ICD-10-CM | POA: Diagnosis not present

## 2022-10-28 MED ORDER — OXYCODONE-ACETAMINOPHEN 5-325 MG PO TABS
2.0000 | ORAL_TABLET | Freq: Once | ORAL | Status: AC
  Administered 2022-10-28: 2 via ORAL
  Filled 2022-10-28: qty 2

## 2022-10-28 MED ORDER — OXYCODONE-ACETAMINOPHEN 5-325 MG PO TABS
1.0000 | ORAL_TABLET | Freq: Once | ORAL | Status: AC
  Administered 2022-10-28: 1 via ORAL
  Filled 2022-10-28: qty 1

## 2022-10-28 MED ORDER — OXYCODONE-ACETAMINOPHEN 5-325 MG PO TABS: 1.0000 | ORAL_TABLET | Freq: Four times a day (QID) | ORAL | 0 refills | Status: DC | PRN

## 2022-10-28 NOTE — Discharge Instructions (Signed)
You are seen in the emergency department for right knee pain.  Based on the imaging ordered for you, there does not appear to be any obvious fractures or dislocation this area.  There are some fluid present in your right knee but not significant fluid in the joint space itself.  After discussing pros and cons, I do not believe it be beneficial to have a joint aspiration emergency department.  Please follow-up with your primary evaluation as well as the orthopedic specialist for further evaluation and potential treatment with them.

## 2022-10-28 NOTE — ED Notes (Addendum)
Pt refused to let this tech put on knee sleeve stated he didn't want any pressure on his knee at this time and would take the sleeve home with him. This tech asked if he knew how to put on the sleeve by himself , pt stated he knew how to put on the sleeve by himself and was comfortable doing so.

## 2022-10-28 NOTE — ED Provider Notes (Signed)
[place] [facility] AT [facility] Provider Note   CSN: [id number] Arrival date & time: 10/28/22  1910     History Chief Complaint  Patient presents with   Knee Injury    Cody Curry is a 52 y.o. male.  Patient presents emergency department with complaint of right knee pain.  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  Patient reports he is unable to straighten his knee at this time due to significant pain reports the pain is about 9 out of 10.  Patient is unable to walk due to inability to straighten or flex his knee.  Has been using a cane recently for added support with ambulation.  HPI     Home Medications Prior to Admission medications   Medication Sig Start Date End Date Taking? Authorizing Provider  oxyCODONE-acetaminophen (PERCOCET/ROXICET) 5-325 MG tablet Take 1 tablet by mouth every 6 (six) hours as needed for severe pain. 10/28/22  Yes Smitty Knudsen, PA-C  albuterol (VENTOLIN HFA) 108 (90 Base) MCG/ACT inhaler Inhale 2 puffs into the lungs every 4 (four) hours as needed for wheezing or shortness of breath. Patient not taking: Reported on 10/25/2022 09/02/22   Park Meo, FNP  atorvastatin (LIPITOR) 40 MG tablet Take 1 tablet (40 mg total) by mouth daily. 12/30/21   Donita Brooks, MD  celecoxib (CELEBREX) 200 MG capsule Take 1 capsule (200 mg total) by mouth 2 (two) times daily. 08/12/22   Donita Brooks, MD  gabapentin (NEURONTIN) 300 MG capsule Take 1 capsule (300 mg total) by mouth 3 (three) times daily. 08/12/22   Donita Brooks, MD  metFORMIN (GLUCOPHAGE) 500 MG tablet Take 1 tablet (500 mg total) by mouth 2 (two) times daily with a meal. 07/27/22   Donita Brooks, MD  omega-3 acid ethyl esters (LOVAZA) 1 g capsule Take 2 capsules by mouth 2 (two) times daily. 12/14/16   [provider]  predniSONE  (DELTASONE) 50 MG tablet Take 1 tablet (50 mg) per day for 7 days 09/02/22   Park Meo, FNP  sertraline (ZOLOFT) 100 MG tablet Take 1 tablet (100 mg total) by mouth daily. 08/12/22   Donita Brooks, MD  tirzepatide [facility]) 7.5 MG/0.5ML Pen Inject 7.5 mg into the skin once a week. 04/15/22   Donita Brooks, MD      Allergies    Patient has no known allergies.    Review of Systems   Review of Systems  Musculoskeletal:  Positive for joint swelling.  All other systems reviewed and are negative.   Physical Exam Updated Vital Signs BP (!) 150/94   Pulse 92   Temp 98.9 F (37.2 C) (Oral)   Resp 16   Ht 5\' 10"  (1.778 m)   Wt 111.6 kg   SpO2 100%   BMI 35.30 kg/m  Physical Exam Vitals and nursing note reviewed.  Constitutional:      General: He is not in acute distress.    Appearance: He is well-developed.  HENT:     Head: Normocephalic and atraumatic.  Eyes:     Conjunctiva/sclera: Conjunctivae normal.  Cardiovascular:     Rate and Rhythm: Normal rate and regular rhythm.     Heart sounds: No murmur heard. Pulmonary:     Effort: Pulmonary effort is normal. No respiratory distress.     Breath  sounds: Normal breath sounds.  Abdominal:     Palpations: Abdomen is soft.     Tenderness: There is no abdominal tenderness.  Musculoskeletal:        General: Swelling and tenderness present. No deformity.     Cervical back: Neck supple.     Comments: Notable swelling to medial aspect of right knee. No obvious bony deformity or abnormalities  Skin:    General: Skin is warm and dry.     Capillary Refill: Capillary refill takes less than 2 seconds.  Neurological:     Mental Status: He is alert.  Psychiatric:        Mood and Affect: Mood normal.     ED Results / Procedures / Treatments   Labs (all labs ordered are listed, but only abnormal results are displayed) Labs Reviewed - No data to display  EKG None  Radiology CT Knee Right Wo Contrast  Result Date:  10/28/2022 CLINICAL DATA:  Right knee pain, limited mobility EXAM: CT OF THE RIGHT KNEE WITHOUT CONTRAST TECHNIQUE: Multidetector CT imaging of the right knee was performed according to the standard protocol. Multiplanar CT image reconstructions were also generated. RADIATION DOSE REDUCTION: This exam was performed according to the departmental dose-optimization program which includes automated exposure control, adjustment of the mA and/or kV according to patient size and/or use of iterative reconstruction technique. COMPARISON:  None Available. FINDINGS: Bones/Joint/Cartilage The knee is held in 80 degree flexion. No acute fracture or dislocation. No lytic or blastic bone lesion. Mild to moderate femorotibial and patellofemoral degenerative arthritis with joint space narrowing and osteophyte formation, most severe within the medial compartment. Ligaments Suboptimally assessed by CT. Muscles and Tendons Unremarkable Soft tissues Moderate left knee effusion. IMPRESSION: 1. No acute osseous abnormality. 2. Mild to moderate femorotibial and patellofemoral degenerative arthritis. 3. Moderate left knee effusion. Electronically Signed   By: Helyn Numbers M.D.   On: 10/28/2022 21:30   DG Knee Complete 4 Views Right  Result Date: 10/28/2022 CLINICAL DATA:  Knee injury EXAM: RIGHT KNEE - COMPLETE 4+ VIEW COMPARISON:  04/12/2022 FINDINGS: Mild joint space narrowing in all 3 compartments. Early spurring in the medial compartment. No joint effusion. No acute bony abnormality. Specifically, no fracture, subluxation, or dislocation. IMPRESSION: Early degenerative changes.  No acute bony abnormality. Electronically Signed   By: Charlett Nose M.D.   On: 10/28/2022 20:02    Procedures Procedures   Medications Ordered in ED Medications  oxyCODONE-acetaminophen (PERCOCET/ROXICET) 5-325 MG per tablet 2 tablet (2 tablets Oral Given 10/28/22 2040)  oxyCODONE-acetaminophen (PERCOCET/ROXICET) 5-325 MG per tablet 1 tablet (1  tablet Oral Given 10/28/22 2241)    ED Course/ Medical Decision Making/ A&P                           Medical Decision Making Amount and/or Complexity of Data Reviewed Radiology: ordered.  Risk Prescription drug management.   This patient presents to the ED for concern of knee pain.  Differential diagnosis includes osteoarthritis, septic joint, fluid effusion of the knee, cellulitis   Imaging Studies ordered:  I ordered imaging studies including right knee x-ray, CT right knee I independently visualized and interpreted imaging which showed no acute fractures dislocations noted.  Notable for joint effusion noted the medial aspect of the right knee I agree with the radiologist interpretation   Medicines ordered and prescription drug management:  I ordered medication including Percocet for pain Reevaluation of the patient after these medicines showed  that the patient improved I have reviewed the patients home medicines and have made adjustments as needed   Problem List / ED Course:  Patient presented to the ED for right knee pain. He reports no obvious injury to the area, but that his knee has become swollen and feels like it buckles when walking at times. Patient saw his PCP a few days ago and was advised that he likely had a meniscal tear based on their examination. Unable to thoroughly test patient's knee due to pain and inability to fully extend the joint. He reports that he was able to move the knee better a few days ago. Given lack of erythema in the knee and no IV drug use, concern for septic joint in very low at this time. I believe that this is more consistent with a ligamental injury of the right knee which would likely benefit from orthopedic evaluation as well as MRI. Will provide patient prescription for pain medication, crutches, and a brace for added stability until he is able to follow up with PCP/orthopedics. Patient is agreeable with treatment plan and verbalized  understanding all return precautions. All questions answered prior to patient discharge.  Final Clinical Impression(s) / ED Diagnoses Final diagnoses:  Arthritis of right knee  Injury of right knee, initial encounter    Rx / DC Orders ED Discharge Orders          Ordered    oxyCODONE-acetaminophen (PERCOCET/ROXICET) 5-325 MG tablet  Every 6 hours PRN        10/28/22 2244              Smitty Knudsen, PA-C 10/28/22 2249    Maia Plan, MD 10/29/22 2324

## 2022-10-28 NOTE — ED Triage Notes (Signed)
Patient arrived via POV c/o right knee injury x 6 days. Patient re-injured right knee by overworking it today. Patient unable to straighten knee at this time. Patient states 9/10 pain at this time. Patient is AO x 4, VS WDL, unable to walk at this time.

## 2022-11-02 ENCOUNTER — Other Ambulatory Visit: Payer: Self-pay

## 2022-11-04 ENCOUNTER — Encounter: Payer: Self-pay | Admitting: Family Medicine

## 2022-11-04 ENCOUNTER — Ambulatory Visit: Payer: Medicaid Other | Admitting: Family Medicine

## 2022-11-04 VITALS — BP 132/82 | HR 74 | Temp 98.4°F | Ht 70.0 in | Wt 256.0 lb

## 2022-11-04 DIAGNOSIS — M25561 Pain in right knee: Secondary | ICD-10-CM

## 2022-11-04 NOTE — Progress Notes (Signed)
Subjective:    Patient ID: Cody Curry, male    DOB: 02-14-1971, 52 y.o.   MRN: [id number]  I recently saw the patient for pain in his right knee.  This recently intensified to the point he had to go to the emergency room.  X-ray showed mild degenerative change of the knee.  He also had CT scan of the right knee.  There was no obvious fracture.  Patient however had a moderate effusion and significant pain over the medial compartment.  Patient is barely able to walk today due to pain in the medial compartment although the effusion has improved. Past Medical History:  Diagnosis Date   Diabetes mellitus type 2 in obese    Diabetic ketoacidosis without coma associated with type 2 diabetes mellitus (HCC)    DKA, type 2 (HCC) 03/28/2017   Hypertriglyceridemia    Neuromuscular disorder [facility])    Past Surgical History:  Procedure Laterality Date   COLONOSCOPY  01/25/2021   [name] at [facility]   REPLACEMENT TOTAL KNEE     Current Outpatient Medications on File Prior to Visit  Medication Sig Dispense Refill   atorvastatin (LIPITOR) 40 MG tablet Take 1 tablet (40 mg total) by mouth daily. 90 tablet 3   celecoxib (CELEBREX) 200 MG capsule Take 1 capsule (200 mg total) by mouth 2 (two) times daily. 60 capsule 11   gabapentin (NEURONTIN) 300 MG capsule Take 1 capsule (300 mg total) by mouth 3 (three) times daily. 270 capsule 3   metFORMIN (GLUCOPHAGE) 500 MG tablet Take 1 tablet (500 mg total) by mouth 2 (two) times daily with a meal. 180 tablet 3   oxyCODONE-acetaminophen (PERCOCET/ROXICET) 5-325 MG tablet Take 1 tablet by mouth every 6 (six) hours as needed for severe pain. 15 tablet 0   predniSONE (DELTASONE) 50 MG tablet Take 1 tablet (50 mg) per day for 7 days 7 tablet 0   sertraline (ZOLOFT) 100 MG tablet Take 1 tablet (100 mg total) by mouth daily. 90 tablet 3   tirzepatide (MOUNJARO) 7.5 MG/0.5ML Pen Inject 7.5 mg into the skin once a week. 6 mL 2   Current Facility-Administered  Medications on File Prior to Visit  Medication Dose Route Frequency Provider Last Rate Last Admin   ipratropium-albuterol (DUONEB) 0.5-2.5 (3) MG/3ML nebulizer solution 3 mL  3 mL Nebulization Once Howard, [employer], FNP       ipratropium-albuterol (DUONEB) 0.5-2.5 (3) MG/3ML nebulizer solution 3 mL  3 mL Nebulization Once Kurtis Bushman S, FNP       methylPREDNISolone acetate (DEPO-MEDROL) injection 80 mg  80 mg Intramuscular Once Park Meo, FNP       No Known Allergies Social History   Socioeconomic History   Marital status: Single    Spouse name: Not on file   Number of children: Not on file   Years of education: Not on file   Highest education level: 12th grade  Occupational History   Not on file  Tobacco Use   Smoking status: Former    Packs/day: 1.5    Types: Cigarettes   Smokeless tobacco: Never  Vaping Use   Vaping Use: Never used  Substance and Sexual Activity   Alcohol use: No    Alcohol/week: 0.0 standard drinks of alcohol    Comment: Very occasional   Drug use: Not Currently    Types: Marijuana    Comment: quit one year ago   Sexual activity: Not on file  Other Topics Concern  Not on file  Social History Narrative   Not on file   Social Determinants of Health   Financial Resource Strain: High Risk (10/31/2022)   Overall Financial Resource Strain (CARDIA)    Difficulty of Paying Living Expenses: Very hard  Food Insecurity: Food Insecurity Present (10/31/2022)   Hunger Vital Sign    Worried About Running Out of Food in the Last Year: Sometimes true    Ran Out of Food in the Last Year: Never true  Transportation Needs: No Transportation Needs (10/31/2022)   PRAPARE - Administrator, Civil Service (Medical): No    Lack of Transportation (Non-Medical): No  Physical Activity: Sufficiently Active (10/31/2022)   Exercise Vital Sign    Days of Exercise per Week: 5 days    Minutes of Exercise per Session: 40 min  Stress: No Stress Concern Present  (10/31/2022)   [employer] of Occupational Health - Occupational Stress Questionnaire    Feeling of Stress : Not at all  Social Connections: Moderately Integrated (10/31/2022)   Social Connection and Isolation Panel [NHANES]    Frequency of Communication with Friends and Family: More than three times a week    Frequency of Social Gatherings with Friends and Family: Once a week    Attends Religious Services: More than 4 times per year    Active Member of [employer] or Organizations: Yes    Attends Engineer, structural: More than 4 times per year    Marital Status: Divorced  Catering manager Violence: Not on file      Review of Systems  All other systems reviewed and are negative.      Objective:   Physical Exam Vitals reviewed.  Constitutional:      Appearance: He is obese.  Cardiovascular:     Rate and Rhythm: Normal rate and regular rhythm.     Pulses: Normal pulses.     Heart sounds: Normal heart sounds.  Pulmonary:     Effort: Pulmonary effort is normal. No respiratory distress.     Breath sounds: Normal breath sounds. No wheezing, rhonchi or rales.  Musculoskeletal:     Right knee: No effusion or erythema. Decreased range of motion. Tenderness present over the medial joint line. Abnormal meniscus.  Lymphadenopathy:     Cervical: No cervical adenopathy.  Neurological:     Mental Status: He is alert.           Assessment & Plan:  Acute pain of right knee - Plan: Ambulatory referral to Orthopedic Surgery  I suspect a meniscal tear.  Patient has tried and failed conservative treatment with an NSAID.  Therefore he agrees to a cortisone injection.  Using sterile technique, I injected the right knee with 2 cc of lidocaine, 2 cc of Marcaine, and 2 cc of 40 mg/mL Kenalog.  The patient tolerated the procedure well without complication.  I will consult orthopedics.  If not improving, he will likely require an MRI to evaluate further

## 2022-11-07 ENCOUNTER — Encounter (HOSPITAL_COMMUNITY): Payer: Self-pay

## 2022-11-07 ENCOUNTER — Ambulatory Visit (INDEPENDENT_AMBULATORY_CARE_PROVIDER_SITE_OTHER): Payer: Medicaid Other | Admitting: Clinical

## 2022-11-07 DIAGNOSIS — F331 Major depressive disorder, recurrent, moderate: Secondary | ICD-10-CM

## 2022-11-07 NOTE — Progress Notes (Signed)
Virtual Visit via Video Note  I connected with Cody Curry on 11/07/22 at  9:00 AM EDT by a video enabled telemedicine application and verified that I am speaking with the correct person using two identifiers.  Location: Patient: Home Provider: Office   I discussed the limitations of evaluation and management by telemedicine and the availability of in person appointments. The patient expressed understanding and agreed to proceed.     Comprehensive Clinical Assessment (CCA) Note  11/07/2022 Cody Curry [id number]  Chief Complaint: Difficulty with functioning due to symptoms of Depression Visit Diagnosis: MDD , Recurrent, Moderate   CCA Screening, Triage and Referral (STR)  Patient Reported Information How did you hear about [place]? No data recorded Referral name: No data recorded Referral phone number: No data recorded  Whom do you see for routine medical problems? No data recorded Practice/Facility Name: No data recorded Practice/Facility Phone Number: No data recorded Name of Contact: No data recorded Contact Number: No data recorded Contact Fax Number: No data recorded Prescriber Name: No data recorded Prescriber Address (if known): No data recorded  What Is the Reason for Your Visit/Call Today? No data recorded How Long Has This Been Causing You Problems? No data recorded What Do You Feel Would Help You the Most Today? No data recorded  Have You Recently Been in Any Inpatient Treatment (Hospital/Detox/Crisis Center/28-Day Program)? No data recorded Name/Location of Program/Hospital:No data recorded How Long Were You There? No data recorded When Were You Discharged? No data recorded  Have You Ever Received Services From [facility] Before? No data recorded Who Do You See at [facility]? No data recorded  Have You Recently Had Any Thoughts About Hurting Yourself? No data recorded Are You Planning to Commit Suicide/Harm Yourself At This time? No data  recorded  Have you Recently Had Thoughts About Hurting Someone [name]? No data recorded Explanation: No data recorded  Have You Used Any Alcohol or Drugs in the Past 24 Hours? No data recorded How Long Ago Did You Use Drugs or Alcohol? No data recorded What Did You Use and How Much? No data recorded  Do You Currently Have a Therapist/Psychiatrist? No data recorded Name of Therapist/Psychiatrist: No data recorded  Have You Been Recently Discharged From Any Office Practice or Programs? No data recorded Explanation of Discharge From Practice/Program: No data recorded    CCA Screening Triage Referral Assessment Type of Contact: No data recorded Is this Initial or Reassessment? No data recorded Date Telepsych consult ordered in [facility]:  No data recorded Time Telepsych consult ordered in [facility]:  No data recorded  Patient Reported Information Reviewed? No data recorded Patient Left Without Being Seen? No data recorded Reason for Not Completing Assessment: No data recorded  Collateral Involvement: No data recorded  Does Patient Have a Court Appointed Legal Guardian? No data recorded Name and Contact of Legal Guardian: No data recorded If Minor and Not Living with Parent(s), Who has Custody? No data recorded Is CPS involved or ever been involved? No data recorded Is APS involved or ever been involved? No data recorded  Patient Determined To Be At Risk for Harm To Self or Others Based on Review of Patient Reported Information or Presenting Complaint? No data recorded Method: No data recorded Availability of Means: No data recorded Intent: No data recorded Notification Required: No data recorded Additional Information for Danger to Others Potential: No data recorded Additional Comments for Danger to Others Potential: No data recorded Are There Guns or Other Weapons in  Your Home? No data recorded Types of Guns/Weapons: No data recorded Are These Weapons Safely Secured?                             No data recorded Who Could Verify You Are Able To Have These Secured: No data recorded Do You Have any Outstanding Charges, Pending Court Dates, Parole/Probation? No data recorded Contacted To Inform of Risk of Harm To Self or Others: No data recorded  Location of Assessment: No data recorded  Does Patient Present under Involuntary Commitment? No data recorded IVC Papers Initial File Date: No data recorded  [place] of Residence: No data recorded  Patient Currently Receiving the Following Services: No data recorded  Determination of Need: No data recorded  Options For Referral: No data recorded    CCA Biopsychosocial Intake/Chief Complaint:  The patient was referred by his med prescriber psychiatrist for further evaluation for therapy services with indication of difficulty with Depression  Current Symptoms/Problems: The patient notes having difficulty with mood and feeling like he fits in anywhere.   Patient Reported Schizophrenia/Schizoaffective Diagnosis in Past: No   Strengths: Serving others, youth sports  Preferences: Parenting a granson, and involvement with church and church involvement, as well as bible study.  Abilities: Couching youth sports and church involvement   Type of Services Patient Feels are Needed: Medication Management / Indvidual Therapy   Initial Clinical Notes/Concerns: The patient notes he is currently receiving med management. The patient has not had prior counseling services. No current S/I or H/I   Mental Health Symptoms Depression:   Change in energy/activity; Difficulty Concentrating; Fatigue; Sleep (too much or little); Irritability; Increase/decrease in appetite; Weight gain/loss (Symptoms have improved since adding med therapy.)   Duration of Depressive symptoms:  Greater than two weeks   Mania:   None   Anxiety:    None   Psychosis:   None   Duration of Psychotic symptoms: NA  Trauma:   None   Obsessions:   None    Compulsions:   None   Inattention:   None   Hyperactivity/Impulsivity:   None   Oppositional/Defiant Behaviors:   None   Emotional Irregularity:   None   Other Mood/Personality Symptoms:   NA    Mental Status Exam Appearance and self-care  Stature:   Average   Weight:   Average weight   Clothing:   Casual   Grooming:   Normal   Cosmetic use:   None   Posture/gait:   Normal   Motor activity:   Not Remarkable   Sensorium  Attention:   Normal   Concentration:   Normal   Orientation:   X5   Recall/memory:   Normal   Affect and Mood  Affect:   Appropriate   Mood:   Depressed   Relating  Eye contact:   None   Facial expression:   Depressed   Attitude toward examiner:   Cooperative   Thought and Language  Speech flow:  Normal   Thought content:   Appropriate to Mood and Circumstances   Preoccupation:   None   Hallucinations:   None   Organization:  Logical  Company secretary of Knowledge:   Good   Intelligence:   Average   Abstraction:   Normal   Judgement:   Good   Reality Testing:   Realistic   Insight:   Good   Decision Making:   Normal  Social Functioning  Social Maturity:   Responsible   Social Judgement:   Normal   Stress  Stressors:   Family conflict; Grief/losses; Illness; Transitions (The patient notes having conflict with family members, Both parents passed in with past 5 years.Type 2 diabeties, neuropathy, knee replacement.)   Coping Ability:   Normal   Skill Deficits:   None   Supports:   Church; Family     Religion: Religion/Spirituality Are You A Religious Person?: Yes What is Your Religious Affiliation?: Christian How Might This Affect Treatment?: Protective Factor  Leisure/Recreation: Leisure / Recreation Do You Have Hobbies?: Yes Leisure and Hobbies: Involvement with church and couching youth sports.  Exercise/Diet: Exercise/Diet Do You Exercise?:  No Have You Gained or Lost A Significant Amount of Weight in the Past Six Months?: Yes-Lost Number of Pounds Lost?: 50 Do You Follow a Special Diet?: No Do You Have Any Trouble Sleeping?: Yes Explanation of Sleeping Difficulties: Difficulty with falling   CCA Employment/Education Employment/Work Situation: Employment / Work Situation Employment Situation: Unemployed Patient's Job has Been Impacted by Current Illness: No What is the Longest Time Patient has Held a Job?: 92yrs Where was the Patient Employed at that Time?: [employer] Has Patient ever Been in the [employer]?: No  Education: Education Is Patient Currently Attending School?: No Last Grade Completed: 12 Name of High School: [employer] Did Garment/textile technologist From [employer]?: Yes Did Theme park manager?: No Did Designer, television/film set?: No Did You Have Any Special Interests In School?: NA Did You Have An Individualized Education Program (IIEP): No Did You Have Any Difficulty At School?: No Patient's Education Has Been Impacted by Current Illness: No   CCA Family/Childhood History Family and Relationship History: Family history Marital status: Divorced Divorced, when?: 2021 What types of issues is patient dealing with in the relationship?: No additional Additional relationship information: No Additional Are you sexually active?: Yes What is your sexual orientation?: Heterosexual Has your sexual activity been affected by drugs, alcohol, medication, or emotional stress?: NA Does patient have children?: Yes How many children?: 2 How is patient's relationship with their children?: The patient has 2 children and i babysit for my younger daughter and we have a good relationship. My older daughter we are close but she has struggled with drug addiction and legal involvement.  Childhood History:  Childhood History By whom was/is the patient raised?: Both parents Additional childhood history  information: No Additonal Description of patient's relationship with caregiver when they were a child: The patient notes having a stern upbringing. Patient's description of current relationship with people who raised him/her: Both parents are deceased. How were you disciplined when you got in trouble as a child/adolescent?: Grounding Does patient have siblings?: Yes Number of Siblings: 4 Description of patient's current relationship with siblings: The patient notes he has a loving relationship with all of his siblings even thought he does not see/interact with them frequently and notes being the closest with his youngest sister. Did patient suffer any verbal/emotional/physical/sexual abuse as a child?: No Did patient suffer from severe childhood neglect?: No Has patient ever been sexually abused/assaulted/raped as an adolescent or adult?: No Was the patient ever a victim of a crime or a disaster?: No Witnessed domestic violence?: No Has patient been affected by domestic violence as an adult?: No  Child/Adolescent Assessment:     CCA Substance Use Alcohol/Drug Use: Alcohol / Drug Use Pain Medications: None Prescriptions: See MAR Over the Counter: None History of alcohol /  drug use?: No history of alcohol / drug abuse Longest period of sobriety (when/how long): NA                         ASAM's:  Six Dimensions of Multidimensional Assessment  Dimension 1:  Acute Intoxication and/or Withdrawal Potential:      Dimension 2:  Biomedical Conditions and Complications:      Dimension 3:  Emotional, Behavioral, or Cognitive Conditions and Complications:     Dimension 4:  Readiness to Change:     Dimension 5:  Relapse, Continued use, or Continued Problem Potential:     Dimension 6:  Recovery/Living Environment:     ASAM Severity Score:    ASAM Recommended Level of Treatment:     Substance use Disorder (SUD)    Recommendations for  Services/Supports/Treatments: Recommendations for Services/Supports/Treatments Recommendations For Services/Supports/Treatments: Individual Therapy, Medication Management  DSM5 Diagnoses: Patient Active Problem List   Diagnosis Date Noted   Hypertriglyceridemia 10/05/2018   Class 3 severe obesity without serious comorbidity with body mass index (BMI) of 40.0 to 44.9 in adult [facility]) 10/05/2018   Diabetes mellitus type 2 in obese 10/05/2018   HTN (hypertension) 10/05/2018   Hyperglycemia 03/28/2017   Controlled diabetes mellitus type 2 with complications (HCC) 03/28/2017   Bilateral carpal tunnel syndrome 03/20/2017   Bilateral hand pain 03/20/2017    Patient Centered Plan: Patient is on the following Treatment Plan(s):  MDD recurrent moderate   Referrals to Alternative Service(s): Referred to Alternative Service(s):   Place:   Date:   Time:    Referred to Alternative Service(s):   Place:   Date:   Time:    Referred to Alternative Service(s):   Place:   Date:   Time:    Referred to Alternative Service(s):   Place:   Date:   Time:      Collaboration of Care: No Additional Collaboration  Patient/Guardian was advised Release of Information must be obtained prior to any record release in order to collaborate their care with an outside provider. Patient/Guardian was advised if they have not already done so to contact the registration department to sign all necessary forms in order for [place] to release information regarding their care.   Consent: Patient/Guardian gives verbal consent for treatment and assignment of benefits for services provided during this visit. Patient/Guardian expressed understanding and agreed to proceed.   Winfred Burn, LCSW  11/07/2022

## 2022-11-11 ENCOUNTER — Encounter: Payer: Self-pay | Admitting: Family Medicine

## 2022-11-11 ENCOUNTER — Other Ambulatory Visit: Payer: Self-pay

## 2022-11-11 DIAGNOSIS — E1140 Type 2 diabetes mellitus with diabetic neuropathy, unspecified: Secondary | ICD-10-CM

## 2022-11-11 DIAGNOSIS — E781 Pure hyperglyceridemia: Secondary | ICD-10-CM

## 2022-11-11 MED ORDER — TIRZEPATIDE 10 MG/0.5ML ~~LOC~~ SOAJ
10.0000 mg | SUBCUTANEOUS | 1 refills | Status: DC
Start: 2022-11-11 — End: 2023-02-24

## 2022-11-18 MED ORDER — TRIAMCINOLONE ACETONIDE 0.1 % EX CREA: 1.0000 | TOPICAL_CREAM | Freq: Two times a day (BID) | CUTANEOUS | 0 refills | Status: DC

## 2022-11-18 MED ORDER — TRIAMCINOLONE ACETONIDE 40 MG/ML IJ SUSP
40.0000 mg | Freq: Once | INTRAMUSCULAR | Status: DC
Start: 2022-11-18 — End: 2023-02-24

## 2022-11-18 NOTE — Addendum Note (Signed)
Addended by: Lynnea Ferrier T on: 11/18/2022 07:12 AM   Modules accepted: Orders

## 2022-12-06 ENCOUNTER — Ambulatory Visit (INDEPENDENT_AMBULATORY_CARE_PROVIDER_SITE_OTHER): Payer: Medicaid Other | Admitting: Clinical

## 2022-12-06 DIAGNOSIS — F331 Major depressive disorder, recurrent, moderate: Secondary | ICD-10-CM

## 2022-12-06 NOTE — Progress Notes (Signed)
Virtual Visit via Video Note  I connected with Cody Curry on 12/06/22 at  9:00 AM EDT by a video enabled telemedicine application and verified that I am speaking with the correct person using two identifiers.  Location: Patient: Home Provider: Office   I discussed the limitations of evaluation and management by telemedicine and the availability of in person appointments. The patient expressed understanding and agreed to proceed.  THERAPIST PROGRESS NOTE   Session Time: 9:00 AM-9:30 AM   Participation Level: Active   Behavioral Response: CasualAlertDepression   Type of Therapy: Individual Therapy   Treatment Goals addressed: Coping Anger Management and Depression   Interventions: CBT, Motivational Interviewing, Solution Focused and Supportive   Summary: Cody Curry is a 52 y.o. male who presents with MDD. The OPT therapist worked with the patient for his scheduled session. The OPT therapist utilized Motivational Interviewing to assist in creating therapeutic repore. The patient spoke about his MH symptoms over the course of the past few weeks and noted his episodes have been connected to multiple changes in his life. The patient is currently watching his grandchild full time and working with his daughter who is a recovering substance user. The patient spoke about  implementing coping including trying journaling. The patient spoke about his ongoing involvement with his church . The OPT therapist utilized Cognitive Behavioral Therapy through cognitive restructuring as well as worked with the patient on  leveraging coping strategies to assist in management of MDD. The patient continues to utlize his existing support network including his church mens group. The patient identified feeling like his med therapy is helping to manage his symptoms.    Suicidal/Homicidal: Nowithout intent/plan   Therapist Response: The OPT therapist worked with the patient for the patients scheduled session.  The patient was engaged in his session and gave feedback in relation to triggers, symptoms, and behavior responses over the past few weeks. The OPT therapist worked with the patient utilizing an in session Cognitive Behavioral Therapy exercise. The patient was responsive in the session and verbalized," Things overall have been going ok I just noticed before I had some episodes of anger and isolation," The OPT therapist worked with the patient overviewing his support symptoms and  implementation of coping  and anger management including strategies within his physical health limits. The OPT therapist worked with the patient overviewing appointments listed in the patients MyChart.  The OPT therapist will continue treatment work with the patient in his next scheduled session.     Plan: Return again in 2/3 weeks.   Diagnosis:      Axis I: MDD recurrent moderate                           Axis II: No diagnosis     Collaboration of Care: No additional collaboration of care.    Patient/Guardian was advised Release of Information must be obtained prior to any record release in order to collaborate their care with an outside provider. Patient/Guardian was advised if they have not already done so to contact the registration department to sign all necessary forms in order for [place] to release information regarding their care.    Consent: Patient/Guardian gives verbal consent for treatment and assignment of benefits for services provided during this visit. Patient/Guardian expressed understanding and agreed to proceed.    I discussed the assessment and treatment plan with the patient. The patient was provided an opportunity to ask questions and all  were answered. The patient agreed with the plan and demonstrated an understanding of the instructions.   The patient was advised to call back or seek an in-person evaluation if the symptoms worsen or if the condition fails to improve as anticipated.   I provided 30  minutes of non-face-to-face time during this encounter.   Winfred Burn, LCSW    12/06/2022

## 2023-01-03 ENCOUNTER — Ambulatory Visit (INDEPENDENT_AMBULATORY_CARE_PROVIDER_SITE_OTHER): Payer: Medicaid Other | Admitting: Clinical

## 2023-01-03 DIAGNOSIS — F331 Major depressive disorder, recurrent, moderate: Secondary | ICD-10-CM | POA: Diagnosis not present

## 2023-01-03 NOTE — Progress Notes (Signed)
Virtual Visit via Video Note   I connected with Cody Curry on 01/03/23 at  10:00 AM EDT by a video enabled telemedicine application and verified that I am speaking with the correct person using two identifiers.   Location: Patient: Home Provider: Office   I discussed the limitations of evaluation and management by telemedicine and the availability of in person appointments. The patient expressed understanding and agreed to proceed.   THERAPIST PROGRESS NOTE   Session Time: 10:00 AM-10:45 AM   Participation Level: Active   Behavioral Response: CasualAlertDepression   Type of Therapy: Individual Therapy   Treatment Goals addressed: Coping Anger Management and Depression   Interventions: CBT, Motivational Interviewing, Solution Focused and Supportive   Summary: Cody Curry is a 52 y.o. male who presents with MDD. The OPT therapist worked with the patient for his scheduled session. The OPT therapist utilized Motivational Interviewing to assist in creating therapeutic repore. The patient spoke about his MH symptoms over the course of the past few weeks. The patient is currently watching his grandchild full time. The patient spoke about using his coping skills as well as his support system with the church . The OPT therapist utilized Cognitive Behavioral Therapy through cognitive restructuring as well as worked with the patient on  leveraging coping strategies to assist in management of MDD. The patient continues to utlize his existing support network and noted he is feeling like his med therapy is helping to manage his symptoms.    Suicidal/Homicidal: Nowithout intent/plan   Therapist Response: The OPT therapist worked with the patient for the patients scheduled session. The patient was engaged in his session and gave feedback in relation to triggers, symptoms, and behavior responses over the past few weeks. The OPT therapist worked with the patient utilizing an in session Cognitive  Behavioral Therapy exercise. The patient was responsive in the session and verbalized," The medicine is really helping I feel like and I have been working to not just push past how I am feeling like men sometimes do and reach out to my church support group when I need to," The OPT therapist worked with the patient overviewing his support symptoms and  implementation of coping  and anger management including strategies within his physical health limits. The OPT therapist worked with the patient overviewing appointments listed in the patients MyChart.  The OPT therapist will continue treatment work with the patient in his next scheduled session.     Plan: Return again in 2/3 weeks.   Diagnosis:      Axis I: MDD recurrent moderate                           Axis II: No diagnosis     Collaboration of Care: No additional collaboration of care.    Patient/Guardian was advised Release of Information must be obtained prior to any record release in order to collaborate their care with an outside provider. Patient/Guardian was advised if they have not already done so to contact the registration department to sign all necessary forms in order for [place] to release information regarding their care.    Consent: Patient/Guardian gives verbal consent for treatment and assignment of benefits for services provided during this visit. Patient/Guardian expressed understanding and agreed to proceed.    I discussed the assessment and treatment plan with the patient. The patient was provided an opportunity to ask questions and all were answered. The patient agreed with the plan  and demonstrated an understanding of the instructions.   The patient was advised to call back or seek an in-person evaluation if the symptoms worsen or if the condition fails to improve as anticipated.   I provided 45 minutes of non-face-to-face time during this encounter.   Cody Burn, LCSW    01/03/2023

## 2023-01-14 ENCOUNTER — Encounter (HOSPITAL_COMMUNITY): Payer: Self-pay | Admitting: Emergency Medicine

## 2023-01-14 ENCOUNTER — Other Ambulatory Visit: Payer: Self-pay

## 2023-01-14 ENCOUNTER — Emergency Department (HOSPITAL_COMMUNITY)
Admission: EM | Admit: 2023-01-14 | Discharge: 2023-01-14 | Disposition: A | Discharge status: Home / Self Care | Payer: Medicaid Other | Attending: Emergency Medicine | Admitting: Emergency Medicine

## 2023-01-14 DIAGNOSIS — K0889 Other specified disorders of teeth and supporting structures: Secondary | ICD-10-CM | POA: Diagnosis present

## 2023-01-14 MED ORDER — PENICILLIN V POTASSIUM 500 MG PO TABS: 500.0000 mg | ORAL_TABLET | Freq: Four times a day (QID) | ORAL | 0 refills | Status: AC

## 2023-01-14 NOTE — ED Provider Notes (Signed)
[place] EMERGENCY DEPARTMENT AT [facility] Provider Note   CSN: [id number] Arrival date & time: 01/14/23  1658     History  Chief Complaint  Patient presents with   Dental Pain    Cody Curry is a 52 y.o. male.  Presenting to the ED for evaluation of right-sided dental pain.  This has been present for the past 8 days.  He has not seen a dentist in many years.  He believes he has a dental infection.  Pain is mostly to the right upper posterior teeth.  He denies any fevers or chills.  No difficulty chewing or swallowing.  He has been using NSAIDs, Tylenol, Orajel with minimal improvement.   Dental Pain      Home Medications Prior to Admission medications   Medication Sig Start Date End Date Taking? Authorizing Provider  penicillin v potassium (VEETID) 500 MG tablet Take 1 tablet (500 mg total) by mouth 4 (four) times daily for 7 days. 01/14/23 01/21/23 Yes Ardath Lepak, Edsel Petrin, PA-C  atorvastatin (LIPITOR) 40 MG tablet Take 1 tablet (40 mg total) by mouth daily. 12/30/21   Donita Brooks, MD  celecoxib (CELEBREX) 200 MG capsule Take 1 capsule (200 mg total) by mouth 2 (two) times daily. 08/12/22   Donita Brooks, MD  gabapentin (NEURONTIN) 300 MG capsule Take 1 capsule (300 mg total) by mouth 3 (three) times daily. 08/12/22   Donita Brooks, MD  metFORMIN (GLUCOPHAGE) 500 MG tablet Take 1 tablet (500 mg total) by mouth 2 (two) times daily with a meal. 07/27/22   Donita Brooks, MD  oxyCODONE-acetaminophen (PERCOCET/ROXICET) 5-325 MG tablet Take 1 tablet by mouth every 6 (six) hours as needed for severe pain. 10/28/22   Smitty Knudsen, PA-C  predniSONE (DELTASONE) 50 MG tablet Take 1 tablet (50 mg) per day for 7 days 09/02/22   Park Meo, FNP  sertraline (ZOLOFT) 100 MG tablet Take 1 tablet (100 mg total) by mouth daily. 08/12/22   Donita Brooks, MD  tirzepatide [facility]) 10 MG/0.5ML Pen Inject 10 mg into the skin once a week. 11/11/22   Donita Brooks, MD       Allergies    Patient has no known allergies.    Review of Systems   Review of Systems  HENT:  Positive for dental problem.   All other systems reviewed and are negative.   Physical Exam Updated Vital Signs BP (!) 151/106 (BP Location: Right Arm)   Pulse 73   Temp 98.4 F (36.9 C) (Oral)   Resp 18   SpO2 99%  Physical Exam Vitals and nursing note reviewed.  Constitutional:      General: He is not in acute distress.    Appearance: Normal appearance. He is normal weight. He is not ill-appearing.  HENT:     Head: Normocephalic and atraumatic.     Mouth/Throat:     Comments: Poor dentition globally with numerous missing teeth.  Mild gingival erythema to the right upper posterior teeth.  No fluctuance or purulent drainage able to be expressed.  Uvula is midline.  Soft palate rises with phonation.  Tongue resting comfortably on the floor of the mouth.  No drooling, trismus or tripoding.  No facial erythema or swelling or induration. Pulmonary:     Effort: Pulmonary effort is normal. No respiratory distress.  Abdominal:     General: Abdomen is flat.  Musculoskeletal:        General: Normal range of  motion.     Cervical back: Neck supple.  Skin:    General: Skin is warm and dry.  Neurological:     Mental Status: He is alert and oriented to person, place, and time.  Psychiatric:        Mood and Affect: Mood normal.        Behavior: Behavior normal.     ED Results / Procedures / Treatments   Labs (all labs ordered are listed, but only abnormal results are displayed) Labs Reviewed - No data to display  EKG None  Radiology No results found.  Procedures Procedures    Medications Ordered in ED Medications - No data to display  ED Course/ Medical Decision Making/ A&P                             Medical Decision Making Risk Prescription drug management.  This patient presents to the ED for concern of dental pain, this involves an extensive number of treatment  options, and is a complaint that carries with it a high risk of complications and morbidity.  The differential diagnosis includes pulpitis, periapical/periodontal abscess, facial cellulitis, Ludwig's angina  Additional history obtained from: Nursing notes from this visit.  Afebrile, hemodynamically stable.  52 year old male presenting to the ED for evaluation of right-sided dental pain.  On exam he has poor dentition.  He has numerous missing teeth.  There is no obvious dental abscess present at this time.  He has a history of dental abscesses.  He has not seen a dentist in quite some time.  He states he has been unable to follow-up with a dentist because he is on Medicaid.  He denies any infectious symptoms.  Will treat with amoxicillin due to his poor dentition status.  He was given a Pharmacist, hospital for dentists in the area.  He was encouraged to follow-up with a dentist as soon as possible.  Overall I do suspect this is likely pulpitis.  He was given return precautions.  Stable at discharge.  At this time there does not appear to be any evidence of an acute emergency medical condition and the patient appears stable for discharge with appropriate outpatient follow up. Diagnosis was discussed with patient who verbalizes understanding of care plan and is agreeable to discharge. I have discussed return precautions with patient who verbalizes understanding. Patient encouraged to follow-up with their PCP within 1 week. All questions answered.  Note: Portions of this report may have been transcribed using voice recognition software. Every effort was made to ensure accuracy; however, inadvertent computerized transcription errors may still be present.        Final Clinical Impression(s) / ED Diagnoses Final diagnoses:  Pain, dental    Rx / DC Orders ED Discharge Orders          Ordered    penicillin v potassium (VEETID) 500 MG tablet  4 times daily        01/14/23 1917               Mora Bellman 01/14/23 Hilbert Bible, MD 01/15/23 1945

## 2023-01-14 NOTE — Discharge Instructions (Signed)
You have been seen today for your complaint of dental pain. Your discharge medications include penicillin. This is an antibiotic. You should take it as prescribed. You should take it for the entire duration of the prescription. This may cause an upset stomach. This is normal. You may take this with food. You may also eat yogurt to prevent diarrhea. Follow up with: A dentist.  I have included a resource list for you Please seek immediate medical care if you develop any of the following symptoms: You are unable to open your mouth. You are having trouble breathing or swallowing. You have a fever. You notice that your face, neck, or jaw is swollen. At this time there does not appear to be the presence of an emergent medical condition, however there is always the potential for conditions to change. Please read and follow the below instructions.  Do not take your medicine if  develop an itchy rash, swelling in your mouth or lips, or difficulty breathing; call 911 and seek immediate emergency medical attention if this occurs.  You may review your lab tests and imaging results in their entirety on your MyChart account.  Please discuss all results of fully with your primary care provider and other specialist at your follow-up visit.  Note: Portions of this text may have been transcribed using voice recognition software. Every effort was made to ensure accuracy; however, inadvertent computerized transcription errors may still be present.

## 2023-01-14 NOTE — ED Triage Notes (Signed)
Pt presents with right sided dental pain for several days. Has tried salt water rinse with no help. Hx of tooth abscess in the past.

## 2023-01-16 ENCOUNTER — Telehealth: Payer: Self-pay

## 2023-01-16 ENCOUNTER — Telehealth: Payer: Self-pay | Admitting: Family Medicine

## 2023-01-16 NOTE — Transitions of Care (Post Inpatient/ED Visit) (Signed)
01/16/2023  Name: Cody Curry MRN: [id number] DOB: [birth year]  Today's TOC FU Call Status: Today's TOC FU Call Status:: Successful TOC FU Call Competed TOC FU Call Complete Date: 01/16/23  Transition Care Management Follow-up Telephone Call Date of Discharge: 01/14/23 Discharge Facility: [facility]) Type of Discharge: Emergency Department Reason for ED Visit: Other: (dental pain) How have you been since you were released from the hospital?: Better Any questions or concerns?: No  Items Reviewed: Did you receive and understand the discharge instructions provided?: Yes Medications obtained,verified, and reconciled?: Yes (Medications Reviewed) Any new allergies since your discharge?: No Dietary orders reviewed?: NA Do you have support at home?: Yes  Medications Reviewed Today: Medications Reviewed Today     Reviewed by Merleen Nicely, LPN (Licensed Practical Nurse) on 01/16/23 at 1351  Med List Status: <None>   Medication Order Taking? Sig Documenting Provider Last Dose Status Informant  atorvastatin (LIPITOR) 40 MG tablet [id number] Yes Take 1 tablet (40 mg total) by mouth daily. Donita Brooks, MD Taking Active   celecoxib (CELEBREX) 200 MG capsule [id number] Yes Take 1 capsule (200 mg total) by mouth 2 (two) times daily. Donita Brooks, MD Taking Active   gabapentin (NEURONTIN) 300 MG capsule [id number] Yes Take 1 capsule (300 mg total) by mouth 3 (three) times daily. Donita Brooks, MD Taking Active   ipratropium-albuterol (DUONEB) 0.5-2.5 (3) MG/3ML nebulizer solution 3 mL [id number]   Park Meo, FNP  Active   ipratropium-albuterol (DUONEB) 0.5-2.5 (3) MG/3ML nebulizer solution 3 mL [id number]   Park Meo, FNP  Active   metFORMIN (GLUCOPHAGE) 500 MG tablet [id number] Yes Take 1 tablet (500 mg total) by mouth 2 (two) times daily with a meal. Donita Brooks, MD Taking Active   methylPREDNISolone acetate (DEPO-MEDROL) injection 80 mg [id number]   Park Meo, FNP  Active   oxyCODONE-acetaminophen (PERCOCET/ROXICET) 5-325 MG tablet [id number] Yes Take 1 tablet by mouth every 6 (six) hours as needed for severe pain. Smitty Knudsen, PA-C Taking Active   penicillin v potassium (VEETID) 500 MG tablet [id number] Yes Take 1 tablet (500 mg total) by mouth 4 (four) times daily for 7 days. Michelle Piper, PA-C Taking Active   predniSONE (DELTASONE) 50 MG tablet [id number] Yes Take 1 tablet (50 mg) per day for 7 days Park Meo, FNP Taking Active   sertraline (ZOLOFT) 100 MG tablet [id number] Yes Take 1 tablet (100 mg total) by mouth daily. Donita Brooks, MD Taking Active   tirzepatide [facility]) 10 MG/0.5ML Pen [id number] Yes Inject 10 mg into the skin once a week. Donita Brooks, MD Taking Active   triamcinolone acetonide [facility]) injection 40 mg [id number]   Donita Brooks, MD  Active             Home Care and Equipment/Supplies: Were Home Health Services Ordered?: NA Any new equipment or medical supplies ordered?: NA  Functional Questionnaire: Do you need assistance with bathing/showering or dressing?: No Do you need assistance with meal preparation?: No Do you need assistance with eating?: No Do you have difficulty maintaining continence: No Do you need assistance with getting out of bed/getting out of a chair/moving?: No Do you have difficulty managing or taking your medications?: No  Follow up appointments reviewed: PCP Follow-up appointment confirmed?: NA Specialist Hospital Follow-up appointment confirmed?: NA Do you need transportation to your follow-up appointment?: No Do you understand care options if your condition(s) worsen?: Yes-patient verbalized understanding  SIGNATURE  Woodfin Ganja LPN [facility] Nurse Health Advisor Direct Dial [phone]

## 2023-01-16 NOTE — Telephone Encounter (Signed)
Prescription Request  01/16/2023  LOV: 11/04/2022  What is the name of the medication or equipment? oxyCODONE-acetaminophen (PERCOCET/ROXICET) 5-325 MG tablet   Have you contacted your pharmacy to request a refill? Yes   Which pharmacy would you like this sent to?  [facility] on [address]  Patient notified that their request is being sent to the clinical staff for review and that they should receive a response within 2 business days.   Please advise at [facility] [phone]

## 2023-01-17 ENCOUNTER — Other Ambulatory Visit: Payer: Self-pay | Admitting: Family Medicine

## 2023-01-17 MED ORDER — OXYCODONE-ACETAMINOPHEN 5-325 MG PO TABS: 1.0000 | ORAL_TABLET | Freq: Four times a day (QID) | ORAL | 0 refills | Status: DC | PRN

## 2023-01-24 ENCOUNTER — Ambulatory Visit (HOSPITAL_COMMUNITY): Payer: Medicaid Other | Admitting: Clinical

## 2023-01-27 ENCOUNTER — Ambulatory Visit (HOSPITAL_COMMUNITY): Payer: Medicaid Other | Admitting: Clinical

## 2023-02-04 ENCOUNTER — Emergency Department (HOSPITAL_BASED_OUTPATIENT_CLINIC_OR_DEPARTMENT_OTHER)
Admission: EM | Admit: 2023-02-04 | Discharge: 2023-02-04 | Disposition: A | Discharge status: Home / Self Care | Payer: Medicaid Other | Attending: Emergency Medicine | Admitting: Emergency Medicine

## 2023-02-04 ENCOUNTER — Other Ambulatory Visit: Payer: Self-pay

## 2023-02-04 ENCOUNTER — Encounter (HOSPITAL_BASED_OUTPATIENT_CLINIC_OR_DEPARTMENT_OTHER): Payer: Self-pay | Admitting: Emergency Medicine

## 2023-02-04 DIAGNOSIS — Z7984 Long term (current) use of oral hypoglycemic drugs: Secondary | ICD-10-CM | POA: Diagnosis not present

## 2023-02-04 DIAGNOSIS — K0889 Other specified disorders of teeth and supporting structures: Secondary | ICD-10-CM | POA: Diagnosis present

## 2023-02-04 DIAGNOSIS — K047 Periapical abscess without sinus: Secondary | ICD-10-CM | POA: Diagnosis not present

## 2023-02-04 DIAGNOSIS — E119 Type 2 diabetes mellitus without complications: Secondary | ICD-10-CM | POA: Diagnosis not present

## 2023-02-04 MED ORDER — BUPIVACAINE-EPINEPHRINE (PF) 0.5% -1:200000 IJ SOLN
5.0000 mL | Freq: Once | INTRAMUSCULAR | Status: AC
  Administered 2023-02-04: 5 mL

## 2023-02-04 MED ORDER — BUPIVACAINE HCL 0.5 % IJ SOLN
5.0000 mL | Freq: Once | INTRAMUSCULAR | Status: DC
  Filled 2023-02-04: qty 1

## 2023-02-04 MED ORDER — AMOXICILLIN 500 MG PO CAPS: 500.0000 mg | ORAL_CAPSULE | Freq: Three times a day (TID) | ORAL | 0 refills | Status: DC

## 2023-02-04 MED ORDER — AMOXICILLIN 500 MG PO CAPS
500.0000 mg | ORAL_CAPSULE | Freq: Once | ORAL | Status: AC
  Administered 2023-02-04: 500 mg via ORAL
  Filled 2023-02-04: qty 1

## 2023-02-04 NOTE — Discharge Instructions (Addendum)
Follow-up with a dentist as soon as possible.  Return to emergency room if you have any worsening symptoms.

## 2023-02-04 NOTE — ED Triage Notes (Addendum)
Pt c/o dental pain (RT lower); sts he completed abx, but is having trouble getting in with a dentist d/t Medicaid; facial swelling noted

## 2023-02-04 NOTE — ED Provider Notes (Signed)
[place] EMERGENCY DEPARTMENT AT [facility] Provider Note   CSN: [id number] Arrival date & time: 02/04/23  1824     History  Chief Complaint  Patient presents with   Dental Pain    Cody Curry is a 52 y.o. male.  Patient is a 52 year old male with a history of diabetes who presents with pain and has right upper tooth.  He was seen earlier this month and treated with penicillin.  He states that it got better but then yesterday started hurting again.  He has been trying to get into a dentist but has not been able to due to his Medicaid.  He denies any fevers.  No trouble swallowing.  No vomiting.       Home Medications Prior to Admission medications   Medication Sig Start Date End Date Taking? Authorizing Provider  amoxicillin (AMOXIL) 500 MG capsule Take 1 capsule (500 mg total) by mouth 3 (three) times daily. 02/04/23  Yes Rolan Bucco, MD  atorvastatin (LIPITOR) 40 MG tablet Take 1 tablet (40 mg total) by mouth daily. 12/30/21   Donita Brooks, MD  celecoxib (CELEBREX) 200 MG capsule Take 1 capsule (200 mg total) by mouth 2 (two) times daily. 08/12/22   Donita Brooks, MD  gabapentin (NEURONTIN) 300 MG capsule Take 1 capsule (300 mg total) by mouth 3 (three) times daily. 08/12/22   Donita Brooks, MD  metFORMIN (GLUCOPHAGE) 500 MG tablet Take 1 tablet (500 mg total) by mouth 2 (two) times daily with a meal. 07/27/22   Donita Brooks, MD  oxyCODONE-acetaminophen (PERCOCET/ROXICET) 5-325 MG tablet Take 1 tablet by mouth every 6 (six) hours as needed for severe pain. 01/17/23   Donita Brooks, MD  predniSONE (DELTASONE) 50 MG tablet Take 1 tablet (50 mg) per day for 7 days 09/02/22   Park Meo, FNP  sertraline (ZOLOFT) 100 MG tablet Take 1 tablet (100 mg total) by mouth daily. 08/12/22   Donita Brooks, MD  tirzepatide [facility]) 10 MG/0.5ML Pen Inject 10 mg into the skin once a week. 11/11/22   Donita Brooks, MD      Allergies    Patient has no  known allergies.    Review of Systems   Review of Systems  Constitutional:  Negative for fever.  HENT:  Positive for dental problem. Negative for facial swelling, trouble swallowing and voice change.   Respiratory:  Negative for shortness of breath.   Gastrointestinal:  Negative for nausea and vomiting.  Skin:  Negative for wound.  Neurological:  Negative for headaches.    Physical Exam Updated Vital Signs BP (!) 151/107 (BP Location: Right Arm)   Pulse 88   Temp 98.2 F (36.8 C) (Oral)   Resp 16   Ht 5\' 10"  (1.778 m)   Wt 103.9 kg   SpO2 98%   BMI 32.86 kg/m  Physical Exam Constitutional:      Appearance: He is well-developed.  HENT:     Head: Normocephalic and atraumatic.     Mouth/Throat:     Comments: Tenderness to the right upper jaw at the level of the right canine.  The tooth is markedly decayed.  There are some mild erythema around the gum.  No induration or fluctuance.  No purulent drainage.  No trismus. Cardiovascular:     Rate and Rhythm: Normal rate.  Pulmonary:     Effort: Pulmonary effort is normal.  Musculoskeletal:     Cervical back: Normal range of  motion and neck supple.  Skin:    General: Skin is warm and dry.  Neurological:     Mental Status: He is alert and oriented to person, place, and time.     ED Results / Procedures / Treatments   Labs (all labs ordered are listed, but only abnormal results are displayed) Labs Reviewed - No data to display  EKG None  Radiology No results found.  Procedures Procedures    Medications Ordered in ED Medications  bupivacaine (MARCAINE) 0.5 % (with pres) injection 5 mL (5 mLs Infiltration Not Given 02/04/23 2006)  bupivacaine-epinephrine (PF) (MARCAINE W/ EPI) 0.5% -1:200000 injection 5 mL (has no administration in time range)  amoxicillin (AMOXIL) capsule 500 mg (500 mg Oral Given 02/04/23 1952)    ED Course/ Medical Decision Making/ A&P                             Medical Decision  Making Risk Prescription drug management.   Patient is a 52 year old male who presents with pain in his right upper tooth.  He has significant dental caries to this area.  He has some mild swelling around the tooth but no significant facial swelling.  No palpable area of fluctuance.  He said his symptoms had resolved when he was seen on January 6 after he finished a course of antibiotics but they have come back.  Will restart a course of amoxicillin.  I did attempt a dental block but he did not tolerate the injection.  This was aborted.  He says he has Percocet at home to use.  Was given a list of dental resources as he had a difficult time getting into a dentist.  He was encouraged to follow-up with a dentist as soon as possible.  Return precautions were given.  Final Clinical Impression(s) / ED Diagnoses Final diagnoses:  Dental infection    Rx / DC Orders ED Discharge Orders          Ordered    amoxicillin (AMOXIL) 500 MG capsule  3 times daily        02/04/23 2015              Rolan Bucco, MD 02/04/23 2016

## 2023-02-06 ENCOUNTER — Telehealth: Payer: Self-pay

## 2023-02-06 NOTE — Transitions of Care (Post Inpatient/ED Visit) (Signed)
02/06/2023  Name: Cody Curry MRN: [id number] DOB: [birth year]  Today's TOC FU Call Status: Today's TOC FU Call Status:: Successful TOC FU Call Competed TOC FU Call Complete Date: 02/06/23  Transition Care Management Follow-up Telephone Call Date of Discharge: 02/04/23 Discharge Facility: [facility] Type of Discharge: Emergency Department Reason for ED Visit: Other: (Dental infection) How have you been since you were released from the hospital?: Better Any questions or concerns?: No  Items Reviewed: Did you receive and understand the discharge instructions provided?: Yes Medications obtained,verified, and reconciled?: Yes (Medications Reviewed) Any new allergies since your discharge?: No Dietary orders reviewed?: Yes Do you have support at home?: Yes  Medications Reviewed Today: Medications Reviewed Today     Reviewed by Merleen Nicely, LPN (Licensed Practical Nurse) on 02/06/23 at 1344  Med List Status: <None>   Medication Order Taking? Sig Documenting Provider Last Dose Status Informant  amoxicillin (AMOXIL) 500 MG capsule [id number] Yes Take 1 capsule (500 mg total) by mouth 3 (three) times daily. Rolan Bucco, MD Taking Active   atorvastatin (LIPITOR) 40 MG tablet [id number] Yes Take 1 tablet (40 mg total) by mouth daily. Donita Brooks, MD Taking Active   celecoxib (CELEBREX) 200 MG capsule [id number] Yes Take 1 capsule (200 mg total) by mouth 2 (two) times daily. Donita Brooks, MD Taking Active   gabapentin (NEURONTIN) 300 MG capsule [id number] Yes Take 1 capsule (300 mg total) by mouth 3 (three) times daily. Donita Brooks, MD Taking Active   ipratropium-albuterol (DUONEB) 0.5-2.5 (3) MG/3ML nebulizer solution 3 mL [id number]   Park Meo, FNP  Active   ipratropium-albuterol (DUONEB) 0.5-2.5 (3) MG/3ML nebulizer solution 3 mL [id number]   Park Meo, FNP  Active   metFORMIN (GLUCOPHAGE) 500 MG tablet [id number] Yes Take 1 tablet (500 mg total)  by mouth 2 (two) times daily with a meal. Donita Brooks, MD Taking Active   methylPREDNISolone acetate (DEPO-MEDROL) injection 80 mg [id number]   Park Meo, FNP  Active   oxyCODONE-acetaminophen (PERCOCET/ROXICET) 5-325 MG tablet [id number] Yes Take 1 tablet by mouth every 6 (six) hours as needed for severe pain. Donita Brooks, MD Taking Active   predniSONE (DELTASONE) 50 MG tablet [id number] Yes Take 1 tablet (50 mg) per day for 7 days Park Meo, FNP Taking Active   sertraline (ZOLOFT) 100 MG tablet [id number] Yes Take 1 tablet (100 mg total) by mouth daily. Donita Brooks, MD Taking Active   tirzepatide [facility]) 10 MG/0.5ML Pen [id number] Yes Inject 10 mg into the skin once a week. Donita Brooks, MD Taking Active   triamcinolone acetonide [facility]) injection 40 mg [id number]   Donita Brooks, MD  Active             Home Care and Equipment/Supplies: Were Home Health Services Ordered?: NA Any new equipment or medical supplies ordered?: NA  Functional Questionnaire: Do you need assistance with bathing/showering or dressing?: No Do you need assistance with meal preparation?: No Do you need assistance with eating?: No Do you have difficulty maintaining continence: No Do you need assistance with getting out of bed/getting out of a chair/moving?: No Do you have difficulty managing or taking your medications?: No  Follow up appointments reviewed: PCP Follow-up appointment confirmed?: No MD Provider Line Number:[phone] Given: Yes Specialist Hospital Follow-up appointment confirmed?: Yes Date of Specialist follow-up appointment?: 02/11/23 Follow-Up Specialty Provider:: Dr Lorin Picket dentist Do you need transportation to your follow-up appointment?: No Do you  understand care options if your condition(s) worsen?: Yes-patient verbalized understanding    SIGNATURE  Woodfin Ganja LPN [facility] Nurse Health Advisor Direct Dial [phone]

## 2023-02-24 ENCOUNTER — Encounter: Payer: Self-pay | Admitting: Family Medicine

## 2023-02-24 ENCOUNTER — Ambulatory Visit: Payer: Medicaid Other | Admitting: Family Medicine

## 2023-02-24 VITALS — BP 120/82 | HR 91 | Temp 98.5°F | Ht 70.0 in | Wt 226.0 lb

## 2023-02-24 DIAGNOSIS — Z125 Encounter for screening for malignant neoplasm of prostate: Secondary | ICD-10-CM | POA: Diagnosis not present

## 2023-02-24 DIAGNOSIS — I10 Essential (primary) hypertension: Secondary | ICD-10-CM | POA: Diagnosis not present

## 2023-02-24 DIAGNOSIS — E781 Pure hyperglyceridemia: Secondary | ICD-10-CM

## 2023-02-24 DIAGNOSIS — Z7985 Long-term (current) use of injectable non-insulin antidiabetic drugs: Secondary | ICD-10-CM

## 2023-02-24 DIAGNOSIS — E1140 Type 2 diabetes mellitus with diabetic neuropathy, unspecified: Secondary | ICD-10-CM

## 2023-02-24 MED ORDER — TIRZEPATIDE 12.5 MG/0.5ML ~~LOC~~ SOAJ
12.5000 mg | SUBCUTANEOUS | 3 refills | Status: DC
Start: 2023-02-24 — End: 2024-04-25

## 2023-02-24 MED ORDER — ATORVASTATIN CALCIUM 40 MG PO TABS
40.0000 mg | ORAL_TABLET | Freq: Every day | ORAL | 3 refills | Status: AC
Start: 2023-02-24 — End: ?

## 2023-02-24 NOTE — Progress Notes (Signed)
Subjective:    Patient ID: Cody Curry, male    DOB: May 13, 1971, 52 y.o.   MRN: [id number]  Wt Readings from Last 3 Encounters:  02/24/23 226 lb (102.5 kg)  02/04/23 229 lb (103.9 kg)  11/04/22 256 lb (116.1 kg)   Patient seems to be doing very well on Mounjaro10 mg subcu weekly.  He is lost 30 pounds since I last saw him.  He denies any nausea or vomiting.  He is occasionally having episodes of hypoglycemia but this seems to occur when he skips meals.  He denies any chest pain or shortness of breath or dyspnea on exertion.  He is not certain if he has been taking his atorvastatin.  He is overdue to recheck his PSA.  Patient suffers from neuropathy in his feet.  He also has a hammertoe with his third toe overgrowing his fourth toe on his left foot Past Medical History:  Diagnosis Date   Diabetes mellitus type 2 in obese    Diabetic ketoacidosis without coma associated with type 2 diabetes mellitus (HCC)    DKA, type 2 (HCC) 03/28/2017   Hypertriglyceridemia    Neuromuscular disorder (HCC)    Past Surgical History:  Procedure Laterality Date   COLONOSCOPY  01/25/2021   [name] at [facility]   REPLACEMENT TOTAL KNEE     Current Outpatient Medications on File Prior to Visit  Medication Sig Dispense Refill   atorvastatin (LIPITOR) 40 MG tablet Take 1 tablet (40 mg total) by mouth daily. 90 tablet 3   celecoxib (CELEBREX) 200 MG capsule Take 1 capsule (200 mg total) by mouth 2 (two) times daily. 60 capsule 11   gabapentin (NEURONTIN) 300 MG capsule Take 1 capsule (300 mg total) by mouth 3 (three) times daily. 270 capsule 3   metFORMIN (GLUCOPHAGE) 500 MG tablet Take 1 tablet (500 mg total) by mouth 2 (two) times daily with a meal. 180 tablet 3   oxyCODONE-acetaminophen (PERCOCET/ROXICET) 5-325 MG tablet Take 1 tablet by mouth every 6 (six) hours as needed for severe pain. 15 tablet 0   sertraline (ZOLOFT) 100 MG tablet Take 1 tablet (100 mg total) by mouth daily. 90 tablet 3    tirzepatide (MOUNJARO) 10 MG/0.5ML Pen Inject 10 mg into the skin once a week. 6 mL 1   No current facility-administered medications on file prior to visit.     No Known Allergies Social History   Socioeconomic History   Marital status: Single    Spouse name: Not on file   Number of children: Not on file   Years of education: Not on file   Highest education level: 12th grade  Occupational History   Not on file  Tobacco Use   Smoking status: Former    Current packs/day: 1.50    Types: Cigarettes   Smokeless tobacco: Never  Vaping Use   Vaping status: Never Used  Substance and Sexual Activity   Alcohol use: No    Alcohol/week: 0.0 standard drinks of alcohol    Comment: Very occasional   Drug use: Not Currently    Types: Marijuana    Comment: quit one year ago   Sexual activity: Not on file  Other Topics Concern   Not on file  Social History Narrative   Not on file   Social Determinants of Health   Financial Resource Strain: High Risk (10/31/2022)   Overall Financial Resource Strain (CARDIA)    Difficulty of Paying Living Expenses: Very hard  Food Insecurity: Food  Insecurity Present (10/31/2022)   Hunger Vital Sign    Worried About Running Out of Food in the Last Year: Sometimes true    Ran Out of Food in the Last Year: Never true  Transportation Needs: No Transportation Needs (10/31/2022)   PRAPARE - Administrator, Civil Service (Medical): No    Lack of Transportation (Non-Medical): No  Physical Activity: Sufficiently Active (10/31/2022)   Exercise Vital Sign    Days of Exercise per Week: 5 days    Minutes of Exercise per Session: 40 min  Stress: No Stress Concern Present (10/31/2022)   [employer] of Occupational Health - Occupational Stress Questionnaire    Feeling of Stress : Not at all  Social Connections: Moderately Integrated (10/31/2022)   Social Connection and Isolation Panel [NHANES]    Frequency of Communication with Friends and Family:  More than three times a week    Frequency of Social Gatherings with Friends and Family: Once a week    Attends Religious Services: More than 4 times per year    Active Member of [employer] or Organizations: Yes    Attends Engineer, structural: More than 4 times per year    Marital Status: Divorced  Catering manager Violence: Not on file      Review of Systems  All other systems reviewed and are negative.      Objective:   Physical Exam Vitals reviewed.  Constitutional:      Appearance: He is obese.  Cardiovascular:     Rate and Rhythm: Normal rate and regular rhythm.     Pulses: Normal pulses.     Heart sounds: Normal heart sounds.  Pulmonary:     Effort: Pulmonary effort is normal. No respiratory distress.     Breath sounds: Normal breath sounds. No wheezing, rhonchi or rales.  Lymphadenopathy:     Cervical: No cervical adenopathy.  Neurological:     Mental Status: He is alert.           Assessment & Plan:  Type 2 diabetes mellitus with diabetic neuropathy, unspecified whether long term insulin use (HCC) - Plan: Hemoglobin A1c, CBC with Differential/Platelet, COMPLETE METABOLIC PANEL WITH GFR, Lipid panel  Hypertriglyceridemia - Plan: atorvastatin (LIPITOR) 40 MG tablet  Primary hypertension  Prostate cancer screening - Plan: PSA Patient is due to check a PSA to screen for prostate cancer.  His blood pressure is excellent.  I will check a fasting lipid panel.  I like his LDL cholesterol to be below 100 and I encouraged him to resume atorvastatin 40 mg a day for cardiovascular protection.  Check hemoglobin A1c.  Goal A1c is less than 6.5.  If well below 6.5, we may discontinue metformin.  However I did increase Mounjaro to 12.5 mg weekly to help facilitate additional weight

## 2023-02-25 LAB — COMPLETE METABOLIC PANEL WITH GFR
AG Ratio: 1.7 (calc) (ref 1.0–2.5)
ALT: 11 U/L (ref 9–46)
AST: 18 U/L (ref 10–35)
Albumin: 4.5 g/dL (ref 3.6–5.1)
Alkaline phosphatase (APISO): 73 U/L (ref 35–144)
BUN: 12 mg/dL (ref 7–25)
CO2: 28 mmol/L (ref 20–32)
Calcium: 9.7 mg/dL (ref 8.6–10.3)
Chloride: 101 mmol/L (ref 98–110)
Creat: 0.92 mg/dL (ref 0.70–1.30)
Globulin: 2.6 g/dL (ref 1.9–3.7)
Glucose, Bld: 84 mg/dL (ref 65–99)
Potassium: 4.3 mmol/L (ref 3.5–5.3)
Sodium: 139 mmol/L (ref 135–146)
Total Bilirubin: 0.4 mg/dL (ref 0.2–1.2)
Total Protein: 7.1 g/dL (ref 6.1–8.1)
eGFR: 99 mL/min/{1.73_m2} (ref >=60)

## 2023-02-25 LAB — CBC WITH DIFFERENTIAL/PLATELET
Absolute Monocytes: 959 {cells}/uL — ABNORMAL HIGH (ref 200–950)
Basophils Absolute: 82 {cells}/uL (ref 0–200)
Basophils Relative: 0.6 %
Eosinophils Absolute: 206 {cells}/uL (ref 15–500)
Eosinophils Relative: 1.5 %
HCT: 47.4 % (ref 38.5–50.0)
Hemoglobin: 16 g/dL (ref 13.2–17.1)
Lymphs Abs: 2672 {cells}/uL (ref 850–3900)
MCH: 29.9 pg (ref 27.0–33.0)
MCHC: 33.8 g/dL (ref 32.0–36.0)
MCV: 88.6 fL (ref 80.0–100.0)
MPV: 10.3 fL (ref 7.5–12.5)
Monocytes Relative: 7 %
Neutro Abs: 9782 {cells}/uL — ABNORMAL HIGH (ref 1500–7800)
Neutrophils Relative %: 71.4 %
Platelets: 230 10*3/uL (ref 140–400)
RBC: 5.35 10*6/uL (ref 4.20–5.80)
RDW: 12.9 % (ref 11.0–15.0)
Total Lymphocyte: 19.5 %
WBC: 13.7 10*3/uL — ABNORMAL HIGH (ref 3.8–10.8)

## 2023-02-25 LAB — LIPID PANEL
Cholesterol: 230 mg/dL — ABNORMAL HIGH (ref <200)
HDL: 31 mg/dL — ABNORMAL LOW (ref >=40)
Non-HDL Cholesterol (Calc): 199 mg/dL — ABNORMAL HIGH (ref <130)
Total CHOL/HDL Ratio: 7.4 (calc) — ABNORMAL HIGH (ref <5.0)
Triglycerides: 402 mg/dL — ABNORMAL HIGH (ref <150)

## 2023-02-25 LAB — PSA: PSA: 0.7 ng/mL (ref <=4.00)

## 2023-02-25 LAB — HEMOGLOBIN A1C
Hgb A1c MFr Bld: 5.7 %{Hb} — ABNORMAL HIGH (ref <5.7)
Mean Plasma Glucose: 117 mg/dL
eAG (mmol/L): 6.5 mmol/L

## 2023-02-27 ENCOUNTER — Other Ambulatory Visit: Payer: Self-pay

## 2023-02-27 MED ORDER — OMEGA-3-ACID ETHYL ESTERS 1 G PO CAPS: 2.0000 g | ORAL_CAPSULE | Freq: Two times a day (BID) | ORAL | 1 refills | Status: DC

## 2023-03-27 ENCOUNTER — Ambulatory Visit: Payer: Medicaid Other | Admitting: Family Medicine

## 2023-03-27 ENCOUNTER — Encounter: Payer: Self-pay | Admitting: Family Medicine

## 2023-03-27 VITALS — BP 117/70 | HR 80 | Temp 97.4°F | Ht 70.0 in | Wt 218.0 lb

## 2023-03-27 DIAGNOSIS — S161XXA Strain of muscle, fascia and tendon at neck level, initial encounter: Secondary | ICD-10-CM | POA: Diagnosis not present

## 2023-03-27 NOTE — Progress Notes (Signed)
Subjective:  HPI: Cody Curry is a 52 y.o. male presenting on 03/27/2023 for Follow-up (pain in neck and shoulder x 7 days/slj)   HPI Patient is in today for 1 week of pain in his bilateral neck and shoulder pain after painting a soccer field while carrying his granddaughter. It is overall improving. Denies upper extremity pain, numbness, or weakness, limited ROM, stiffness, lower extremity weakness, bladder or bowel dysfunction, fever, weight loss, vision changes, or headache. Has tried ice and heat, flexeril, and percocet. Symptoms overall improving  Review of Systems  All other systems reviewed and are negative.   Relevant past medical history reviewed and updated as indicated.   Past Medical History:  Diagnosis Date   Diabetes mellitus type 2 in obese    Diabetic ketoacidosis without coma associated with type 2 diabetes mellitus (HCC)    DKA, type 2 (HCC) 03/28/2017   Hypertriglyceridemia    Neuromuscular disorder [facility])      Past Surgical History:  Procedure Laterality Date   COLONOSCOPY  01/25/2021   [name] at [facility]   REPLACEMENT TOTAL KNEE      Allergies and medications reviewed and updated.   Current Outpatient Medications:    atorvastatin (LIPITOR) 40 MG tablet, Take 1 tablet (40 mg total) by mouth daily., Disp: 90 tablet, Rfl: 3   celecoxib (CELEBREX) 200 MG capsule, Take 1 capsule (200 mg total) by mouth 2 (two) times daily., Disp: 60 capsule, Rfl: 11   gabapentin (NEURONTIN) 300 MG capsule, Take 1 capsule (300 mg total) by mouth 3 (three) times daily., Disp: 270 capsule, Rfl: 3   metFORMIN (GLUCOPHAGE) 500 MG tablet, Take 1 tablet (500 mg total) by mouth 2 (two) times daily with a meal., Disp: 180 tablet, Rfl: 3   omega-3 acid ethyl esters (LOVAZA) 1 g capsule, Take 2 capsules (2 g total) by mouth 2 (two) times daily., Disp: 90 capsule, Rfl: 1   oxyCODONE-acetaminophen (PERCOCET/ROXICET) 5-325 MG tablet, Take 1 tablet by mouth every 6 (six) hours as  needed for severe pain., Disp: 15 tablet, Rfl: 0   sertraline (ZOLOFT) 100 MG tablet, Take 1 tablet (100 mg total) by mouth daily., Disp: 90 tablet, Rfl: 3   tirzepatide (MOUNJARO) 12.5 MG/0.5ML Pen, Inject 12.5 mg into the skin once a week., Disp: 6 mL, Rfl: 3  No Known Allergies  Objective:   BP 117/70   Pulse 80   Temp (!) 97.4 F (36.3 C) (Oral)   Ht 5\' 10"  (1.778 m)   Wt 218 lb (98.9 kg)   SpO2 97%   BMI 31.28 kg/m      03/27/2023    2:28 PM 02/24/2023    8:13 AM 02/04/2023    8:25 PM  Vitals with BMI  Height 5\' 10"  5\' 10"    Weight 218 lbs 226 lbs   BMI 31.28 32.43   Systolic 117 120 657  Diastolic 70 82 117  Pulse 80 91 64     Physical Exam Vitals and nursing note reviewed.  Constitutional:      Appearance: Normal appearance. He is normal weight.  HENT:     Head: Normocephalic and atraumatic.  Musculoskeletal:        General: Normal range of motion.     Cervical back: Bony tenderness present.  Skin:    General: Skin is warm and dry.     Capillary Refill: Capillary refill takes less than 2 seconds.  Neurological:     General: No focal deficit present.  Mental Status: He is alert and oriented to person, place, and time. Mental status is at baseline.  Psychiatric:        Mood and Affect: Mood normal.        Behavior: Behavior normal.        Thought Content: Thought content normal.        Judgment: Judgment normal.     Assessment & Plan:  Cervical strain, acute, initial encounter Assessment & Plan: Patient here today with shoulder and neck pain for 1 week after working while carrying his granddaughter. No red flags however he does have cervical spine tenderness at approx C2 level. He has been using Percocet and Flexeril with some relief so may continue this as well as heat and rest, encouraged to limit heavy lifting until symptoms resolve and also provided with handout for stretches.. Will obtain cervical x-ray. Seek medical care if symptoms worsen or  persist.  Orders: -     DG Cervical Spine Complete; Future     Follow up plan: Return if symptoms worsen or fail to improve.  Park Meo, FNP

## 2023-03-27 NOTE — Assessment & Plan Note (Addendum)
Patient here today with shoulder and neck pain for 1 week after working while carrying his granddaughter. No red flags however he does have cervical spine tenderness at approx C2 level. He has been using Percocet and Flexeril with some relief so may continue this as well as heat and rest, encouraged to limit heavy lifting until symptoms resolve and also provided with handout for stretches.. Will obtain cervical x-ray. Seek medical care if symptoms worsen or persist.

## 2023-05-25 ENCOUNTER — Other Ambulatory Visit: Payer: Self-pay | Admitting: Family Medicine

## 2023-05-26 NOTE — Telephone Encounter (Signed)
Unable to refill per protocol, Rx expired. Discontinued 11/18/22.  Requested Prescriptions  Pending Prescriptions Disp Refills   triamcinolone cream (KENALOG) 0.1 % [Pharmacy Med Name: TRIAMCINOLONE 0.1% CREAM] 30 g 0    Sig: APPLY TO AFFECTED AREA TWICE A DAY     Not Delegated - Dermatology:  Corticosteroids Failed - 05/25/2023 11:34 PM      Failed - This refill cannot be delegated      Failed - Valid encounter within last 12 months    Recent Outpatient Visits           1 year ago Viral upper respiratory tract infection   [facility] Donita Brooks, MD   1 year ago Controlled type 2 diabetes mellitus with other specified complication, without long-term current use of insulin (HCC)   [facility] Donita Brooks, MD   2 years ago Acute left-sided low back pain without sciatica   [facility] Cathlean Marseilles A, NP   2 years ago Controlled type 2 diabetes mellitus with other specified complication, without long-term current use of insulin (HCC)   [facility] [name], Priscille Heidelberg, MD   2 years ago Left foot pain   [facility] [name], Priscille Heidelberg, MD

## 2023-06-14 ENCOUNTER — Telehealth: Payer: Self-pay

## 2023-06-14 NOTE — Telephone Encounter (Signed)
LVM re diabetic eye exam.  Pls schedule pt's diabetic eye exam for 06/28/23 or 06/30/23 when they call back

## 2023-06-15 ENCOUNTER — Telehealth: Payer: Self-pay | Admitting: Family Medicine

## 2023-06-15 NOTE — Telephone Encounter (Signed)
Patient came to the office to request for provider to complete a form giving approval for him to donate plasma.  Patient requesting for [place] to call him to collect any due form completion fees, and then to fax the forms to the fax number on the forms.   Form placed on nurse's desk.

## 2023-06-23 ENCOUNTER — Encounter: Payer: Self-pay | Admitting: Family Medicine

## 2023-06-23 ENCOUNTER — Ambulatory Visit: Payer: Medicaid Other | Admitting: Family Medicine

## 2023-06-23 VITALS — BP 120/78 | HR 69 | Temp 98.4°F | Ht 70.0 in | Wt 213.0 lb

## 2023-06-23 DIAGNOSIS — F331 Major depressive disorder, recurrent, moderate: Secondary | ICD-10-CM | POA: Diagnosis not present

## 2023-06-23 DIAGNOSIS — Z0279 Encounter for issue of other medical certificate: Secondary | ICD-10-CM

## 2023-06-23 MED ORDER — BUPROPION HCL ER (XL) 150 MG PO TB24: 150.0000 mg | ORAL_TABLET | Freq: Every day | ORAL | 3 refills | Status: DC

## 2023-06-23 NOTE — Progress Notes (Signed)
Subjective:    Patient ID: Cody Curry, male    DOB: [birth year], 52 y.o.   MRN: [id number]  Patient is going through a tremendous amount of personal stress.  He is already battling depression and taking Zoloft.  He is raising his grandson.  Recently his daughter had children that she is unable to care for.  She is an addict.  She abuses cocaine.  The patient had to call CPS on his daughter because of her behavior.  Now, his daughter is resentful of him.  The extended family is resentful of him.  He feels extremely guilty but he knows it had to be done.  As result, his depression has worsened.  He is isolating.  He is avoiding going to church.  He reports anhedonia. Past Medical History:  Diagnosis Date   Diabetes mellitus type 2 in obese    Diabetic ketoacidosis without coma associated with type 2 diabetes mellitus (HCC)    DKA, type 2 (HCC) 03/28/2017   Hypertriglyceridemia    Neuromuscular disorder [facility])    Past Surgical History:  Procedure Laterality Date   COLONOSCOPY  01/25/2021   [name] at [facility]   REPLACEMENT TOTAL KNEE     Current Outpatient Medications on File Prior to Visit  Medication Sig Dispense Refill   atorvastatin (LIPITOR) 40 MG tablet Take 1 tablet (40 mg total) by mouth daily. 90 tablet 3   celecoxib (CELEBREX) 200 MG capsule Take 1 capsule (200 mg total) by mouth 2 (two) times daily. 60 capsule 11   gabapentin (NEURONTIN) 300 MG capsule Take 1 capsule (300 mg total) by mouth 3 (three) times daily. 270 capsule 3   metFORMIN (GLUCOPHAGE) 500 MG tablet Take 1 tablet (500 mg total) by mouth 2 (two) times daily with a meal. 180 tablet 3   sertraline (ZOLOFT) 100 MG tablet Take 1 tablet (100 mg total) by mouth daily. 90 tablet 3   tirzepatide (MOUNJARO) 12.5 MG/0.5ML Pen Inject 12.5 mg into the skin once a week. 6 mL 3   omega-3 acid ethyl esters (LOVAZA) 1 g capsule Take 2 capsules (2 g total) by mouth 2 (two) times daily. 90 capsule 1   oxyCODONE-acetaminophen  (PERCOCET/ROXICET) 5-325 MG tablet Take 1 tablet by mouth every 6 (six) hours as needed for severe pain. 15 tablet 0   No current facility-administered medications on file prior to visit.     No Known Allergies Social History   Socioeconomic History   Marital status: Single    Spouse name: Not on file   Number of children: Not on file   Years of education: Not on file   Highest education level: 12th grade  Occupational History   Not on file  Tobacco Use   Smoking status: Former    Current packs/day: 1.50    Types: Cigarettes   Smokeless tobacco: Never  Vaping Use   Vaping status: Never Used  Substance and Sexual Activity   Alcohol use: No    Alcohol/week: 0.0 standard drinks of alcohol    Comment: Very occasional   Drug use: Not Currently    Types: Marijuana    Comment: quit one year ago   Sexual activity: Not on file  Other Topics Concern   Not on file  Social History Narrative   Not on file   Social Drivers of Health   Financial Resource Strain: Medium Risk (06/16/2023)   Overall Financial Resource Strain (CARDIA)    Difficulty of Paying Living Expenses: Somewhat hard  Food Insecurity: No Food Insecurity (06/16/2023)   Hunger Vital Sign    Worried About Running Out of Food in the Last Year: Never true    Ran Out of Food in the Last Year: Never true  Transportation Needs: No Transportation Needs (06/16/2023)   PRAPARE - Administrator, Civil Service (Medical): No    Lack of Transportation (Non-Medical): No  Physical Activity: Sufficiently Active (06/16/2023)   Exercise Vital Sign    Days of Exercise per Week: 7 days    Minutes of Exercise per Session: 90 min  Stress: No Stress Concern Present (06/16/2023)   [employer] of Occupational Health - Occupational Stress Questionnaire    Feeling of Stress : Only a little  Social Connections: Moderately Integrated (06/16/2023)   Social Connection and Isolation Panel [NHANES]    Frequency of Communication  with Friends and Family: More than three times a week    Frequency of Social Gatherings with Friends and Family: Once a week    Attends Religious Services: More than 4 times per year    Active Member of [employer] or Organizations: Yes    Attends Engineer, structural: More than 4 times per year    Marital Status: Divorced  Catering manager Violence: Not on file      Review of Systems  All other systems reviewed and are negative.      Objective:   Physical Exam Vitals reviewed.  Constitutional:      Appearance: He is obese.  Cardiovascular:     Rate and Rhythm: Normal rate and regular rhythm.     Pulses: Normal pulses.     Heart sounds: Normal heart sounds.  Pulmonary:     Effort: Pulmonary effort is normal. No respiratory distress.     Breath sounds: Normal breath sounds. No wheezing, rhonchi or rales.  Lymphadenopathy:     Cervical: No cervical adenopathy.  Neurological:     Mental Status: He is alert.           Assessment & Plan:  Moderate episode of recurrent major depressive disorder (HCC) We discussed his options and the patient elects to continue Zoloft 100 mg p.o. daily but supplement with Wellbutrin 150 mg extended release daily.  Reassess in 4 weeks to see if this combination is effective.  Consider Rexulti versus Lamictal if not

## 2023-06-27 NOTE — Telephone Encounter (Addendum)
Form completed and signed.  Left message on patient's voicemail to advise; need to collect $10 form completion fee.  All paperwork successfully faxed to [facility] with a confirmation time stamp of 06-27-2023 11:16

## 2023-06-30 ENCOUNTER — Ambulatory Visit (INDEPENDENT_AMBULATORY_CARE_PROVIDER_SITE_OTHER): Payer: Medicaid Other

## 2023-06-30 DIAGNOSIS — E118 Type 2 diabetes mellitus with unspecified complications: Secondary | ICD-10-CM

## 2023-06-30 DIAGNOSIS — E119 Type 2 diabetes mellitus without complications: Secondary | ICD-10-CM

## 2023-06-30 LAB — HM DIABETES EYE EXAM

## 2023-06-30 NOTE — Telephone Encounter (Signed)
Patient paid form completion fee as cash; also gave him the original forms.  Nothing further needed at this time.

## 2023-06-30 NOTE — Progress Notes (Signed)
Cody Curry arrived 06/30/2023 and has given verbal consent to obtain images and complete their overdue diabetic retinal screening.  The images have been sent to an ophthalmologist or optometrist for review and interpretation.  Results will be sent back to Donita Brooks, MD for review.  Patient has been informed they will be contacted when we receive the results via telephone or MyChart

## 2023-07-20 ENCOUNTER — Other Ambulatory Visit: Payer: Self-pay | Admitting: Family Medicine

## 2023-07-21 ENCOUNTER — Ambulatory Visit: Payer: Medicaid Other | Admitting: Family Medicine

## 2023-07-24 ENCOUNTER — Telehealth: Payer: Self-pay | Admitting: Family Medicine

## 2023-07-24 NOTE — Telephone Encounter (Signed)
 May give #90 as Rx includes this supply.  Requested Prescriptions  Pending Prescriptions Disp Refills   buPROPion  (WELLBUTRIN  XL) 150 MG 24 hr tablet [Pharmacy Med Name: BUPROPION  HCL XL 150 MG TABLET] 90 tablet 0    Sig: TAKE 1 TABLET BY MOUTH EVERY DAY     Psychiatry: Antidepressants - bupropion  Failed - 07/24/2023 11:38 AM      Failed - Valid encounter within last 6 months    Recent Outpatient Visits           1 year ago Viral upper respiratory tract infection   [facility] Duanne Butler DASEN, MD   2 years ago Controlled type 2 diabetes mellitus with other specified complication, without long-term current use of insulin  (HCC)   [facility] [name], Butler DASEN, MD   2 years ago Acute left-sided low back pain without sciatica   [facility] Chandra Harlene LABOR, NP   2 years ago Controlled type 2 diabetes mellitus with other specified complication, without long-term current use of insulin  (HCC)   [facility] [name], Butler DASEN, MD   2 years ago Left foot pain   [facility] [name], Butler DASEN, MD              Passed - Cr in normal range and within 360 days    Creat  Date Value Ref Range Status  02/24/2023 0.92 0.70 - 1.30 mg/dL Final   Creatinine, Urine  Date Value Ref Range Status  08/12/2022 157 20 - 320 mg/dL Final         Passed - AST in normal range and within 360 days    AST  Date Value Ref Range Status  02/24/2023 18 10 - 35 U/L Final         Passed - ALT in normal range and within 360 days    ALT  Date Value Ref Range Status  02/24/2023 11 9 - 46 U/L Final         Passed - Last BP in normal range    BP Readings from Last 1 Encounters:  06/23/23 120/78

## 2023-07-26 ENCOUNTER — Telehealth: Payer: Self-pay

## 2023-07-26 NOTE — Telephone Encounter (Signed)
 Outcome Prior Authorization is not required at this time. Pharmacy needs to submit override codes for Drug Utilization Review. Drug Mounjaro  7.5MG /0.5ML auto-injectors  Pt advised via My Chart message. Mjp,lpn

## 2023-07-28 ENCOUNTER — Ambulatory Visit: Payer: Medicaid Other | Admitting: Family Medicine

## 2023-07-28 VITALS — BP 132/78 | HR 68 | Temp 97.8°F | Ht 70.0 in | Wt 213.0 lb

## 2023-07-28 DIAGNOSIS — M25561 Pain in right knee: Secondary | ICD-10-CM | POA: Diagnosis not present

## 2023-07-28 DIAGNOSIS — R0781 Pleurodynia: Secondary | ICD-10-CM

## 2023-07-28 MED ORDER — TADALAFIL 20 MG PO TABS: 10.0000 mg | ORAL_TABLET | ORAL | 11 refills | Status: AC | PRN

## 2023-07-28 MED ORDER — TRIAMCINOLONE ACETONIDE 40 MG/ML IJ SUSP
80.0000 mg | Freq: Once | INTRAMUSCULAR | Status: AC
Start: 2023-07-28 — End: 2023-07-28
  Administered 2023-07-28: 80 mg via INTRA_ARTICULAR

## 2023-07-28 NOTE — Addendum Note (Signed)
Addended by: Venia Carbon K on: 07/28/2023 02:10 PM   Modules accepted: Orders

## 2023-07-28 NOTE — Progress Notes (Signed)
Subjective:    Patient ID: Cody Curry, male    DOB: 18-Feb-1971, 53 y.o.   MRN: [id number]  Patient has a history of osteoarthritis in his right knee.  He is requesting a cortisone injection in his knee due to pain in his knee with walking.  He also complains of right rib pain.  Just anterior to the mid axillary line over the distal end of the right eighth rib, the patient has tenderness to palpation.  It hurts to twist and move.  He denies any trouble breathing.  He denies any pleurisy.  He denies any hemoptysis.  He denies any cough or fever or chills or shortness of breath. Past Medical History:  Diagnosis Date   Diabetes mellitus type 2 in obese    Diabetic ketoacidosis without coma associated with type 2 diabetes mellitus (HCC)    DKA, type 2 (HCC) 03/28/2017   Hypertriglyceridemia    Neuromuscular disorder [facility])    Past Surgical History:  Procedure Laterality Date   COLONOSCOPY  01/25/2021   [name] at [facility]   REPLACEMENT TOTAL KNEE     Current Outpatient Medications on File Prior to Visit  Medication Sig Dispense Refill   atorvastatin (LIPITOR) 40 MG tablet Take 1 tablet (40 mg total) by mouth daily. 90 tablet 3   buPROPion (WELLBUTRIN XL) 150 MG 24 hr tablet TAKE 1 TABLET BY MOUTH EVERY DAY 90 tablet 0   celecoxib (CELEBREX) 200 MG capsule Take 1 capsule (200 mg total) by mouth 2 (two) times daily. 60 capsule 11   gabapentin (NEURONTIN) 300 MG capsule Take 1 capsule (300 mg total) by mouth 3 (three) times daily. 270 capsule 3   metFORMIN (GLUCOPHAGE) 500 MG tablet Take 1 tablet (500 mg total) by mouth 2 (two) times daily with a meal. 180 tablet 3   sertraline (ZOLOFT) 100 MG tablet Take 1 tablet (100 mg total) by mouth daily. 90 tablet 3   tirzepatide (MOUNJARO) 12.5 MG/0.5ML Pen Inject 12.5 mg into the skin once a week. 6 mL 3   No current facility-administered medications on file prior to visit.   No Known Allergies Social History   Socioeconomic History    Marital status: Single    Spouse name: Not on file   Number of children: Not on file   Years of education: Not on file   Highest education level: Some college, no degree  Occupational History   Not on file  Tobacco Use   Smoking status: Former    Current packs/day: 1.50    Types: Cigarettes   Smokeless tobacco: Never  Vaping Use   Vaping status: Never Used  Substance and Sexual Activity   Alcohol use: No    Alcohol/week: 0.0 standard drinks of alcohol    Comment: Very occasional   Drug use: Not Currently    Types: Marijuana    Comment: quit one year ago   Sexual activity: Not on file  Other Topics Concern   Not on file  Social History Narrative   Not on file   Social Drivers of Health   Financial Resource Strain: High Risk (07/28/2023)   Overall Financial Resource Strain (CARDIA)    Difficulty of Paying Living Expenses: Hard  Food Insecurity: Food Insecurity Present (07/28/2023)   Hunger Vital Sign    Worried About Running Out of Food in the Last Year: Never true    Ran Out of Food in the Last Year: Sometimes true  Transportation Needs: No Transportation Needs (07/28/2023)  PRAPARE - Administrator, Civil Service (Medical): No    Lack of Transportation (Non-Medical): No  Physical Activity: Sufficiently Active (07/28/2023)   Exercise Vital Sign    Days of Exercise per Week: 4 days    Minutes of Exercise per Session: 70 min  Stress: No Stress Concern Present (06/16/2023)   [employer] of Occupational Health - Occupational Stress Questionnaire    Feeling of Stress : Only a little  Social Connections: Moderately Integrated (07/28/2023)   Social Connection and Isolation Panel [NHANES]    Frequency of Communication with Friends and Family: Once a week    Frequency of Social Gatherings with Friends and Family: More than three times a week    Attends Religious Services: More than 4 times per year    Active Member of [employer] or Organizations: Yes    Attends Probation officer: More than 4 times per year    Marital Status: Divorced  Catering manager Violence: Not on file      Review of Systems  All other systems reviewed and are negative.      Objective:   Physical Exam Vitals reviewed.  Constitutional:      Appearance: He is obese.  Cardiovascular:     Rate and Rhythm: Normal rate and regular rhythm.     Pulses: Normal pulses.     Heart sounds: Normal heart sounds.  Pulmonary:     Effort: Pulmonary effort is normal. No respiratory distress.     Breath sounds: Normal breath sounds. No wheezing, rhonchi or rales.  Chest:     Chest wall: Tenderness present.    Musculoskeletal:     Right knee: No effusion or erythema. Decreased range of motion. Tenderness present over the medial joint line. Abnormal meniscus.  Lymphadenopathy:     Cervical: No cervical adenopathy.  Neurological:     Mental Status: He is alert.           Assessment & Plan:  Rib pain - Plan: DG Ribs Unilateral Right  Acute pain of right knee I believe the patient may have pulled a muscle in between his ribs or even tore cartilage there.  Obtain an x-ray to rule out any fracture in the rib.  Patient certainly has osteoarthritis in his right knee and is requesting a cortisone injection there. Using sterile technique, I injected the right knee with 2 cc of lidocaine, 2 cc of Marcaine, and 2 cc of 40 mg/mL Kenalog.  The patient tolerated the procedure well without complication.

## 2023-08-24 ENCOUNTER — Other Ambulatory Visit: Payer: Self-pay | Admitting: Family Medicine

## 2023-08-24 DIAGNOSIS — E118 Type 2 diabetes mellitus with unspecified complications: Secondary | ICD-10-CM

## 2023-09-10 ENCOUNTER — Other Ambulatory Visit: Payer: Self-pay | Admitting: Family Medicine

## 2023-09-26 ENCOUNTER — Other Ambulatory Visit: Payer: Self-pay | Admitting: Family Medicine

## 2023-09-27 NOTE — Telephone Encounter (Signed)
 Last OV 06/23/23 Requested Prescriptions  Pending Prescriptions Disp Refills   celecoxib (CELEBREX) 200 MG capsule [Pharmacy Med Name: CELECOXIB 200 MG CAPSULE] 60 capsule 11    Sig: TAKE 1 CAPSULE BY MOUTH TWICE A DAY     Analgesics:  COX2 Inhibitors Failed - 09/27/2023  3:23 PM      Failed - Manual Review: Labs are only required if the patient has taken medication for more than 8 weeks.      Failed - Valid encounter within last 12 months    Recent Outpatient Visits           2 years ago Viral upper respiratory tract infection   [facility] Tanya Nones, Priscille Heidelberg, MD   2 years ago Controlled type 2 diabetes mellitus with other specified complication, without long-term current use of insulin (HCC)   [facility] [name], Priscille Heidelberg, MD   2 years ago Acute left-sided low back pain without sciatica   [facility] Valentino Nose, NP   2 years ago Controlled type 2 diabetes mellitus with other specified complication, without long-term current use of insulin (HCC)   [facility] [name], Priscille Heidelberg, MD   2 years ago Left foot pain   [facility] [name], Priscille Heidelberg, MD              Passed - HGB in normal range and within 360 days    Hemoglobin  Date Value Ref Range Status  02/24/2023 16.0 13.2 - 17.1 g/dL Final         Passed - Cr in normal range and within 360 days    Creat  Date Value Ref Range Status  02/24/2023 0.92 0.70 - 1.30 mg/dL Final   Creatinine, Urine  Date Value Ref Range Status  08/12/2022 157 20 - 320 mg/dL Final         Passed - HCT in normal range and within 360 days    HCT  Date Value Ref Range Status  02/24/2023 47.4 38.5 - 50.0 % Final         Passed - AST in normal range and within 360 days    AST  Date Value Ref Range Status  02/24/2023 18 10 - 35 U/L Final         Passed - ALT in normal range and within 360 days    ALT  Date Value Ref Range Status  02/24/2023 11 9 -  46 U/L Final         Passed - eGFR is 30 or above and within 360 days    GFR, Est African American  Date Value Ref Range Status  11/11/2020 104 > OR = 60 mL/min/1.43m2 Final   GFR, Est Non African American  Date Value Ref Range Status  11/11/2020 90 > OR = 60 mL/min/1.67m2 Final   eGFR  Date Value Ref Range Status  02/24/2023 99 > OR = 60 mL/min/1.12m2 Final         Passed - Patient is not pregnant

## 2023-09-29 ENCOUNTER — Other Ambulatory Visit

## 2023-09-29 DIAGNOSIS — I10 Essential (primary) hypertension: Secondary | ICD-10-CM

## 2023-09-29 DIAGNOSIS — Z7984 Long term (current) use of oral hypoglycemic drugs: Secondary | ICD-10-CM

## 2023-09-30 LAB — COMPREHENSIVE METABOLIC PANEL
AG Ratio: 1.7 (calc) (ref 1.0–2.5)
ALT: 10 U/L (ref 9–46)
AST: 16 U/L (ref 10–35)
Albumin: 4.4 g/dL (ref 3.6–5.1)
Alkaline phosphatase (APISO): 92 U/L (ref 35–144)
BUN: 15 mg/dL (ref 7–25)
CO2: 31 mmol/L (ref 20–32)
Calcium: 9.4 mg/dL (ref 8.6–10.3)
Chloride: 102 mmol/L (ref 98–110)
Creat: 0.91 mg/dL (ref 0.70–1.30)
Globulin: 2.6 g/dL (ref 1.9–3.7)
Glucose, Bld: 84 mg/dL (ref 65–99)
Potassium: 4.7 mmol/L (ref 3.5–5.3)
Sodium: 141 mmol/L (ref 135–146)
Total Bilirubin: 0.3 mg/dL (ref 0.2–1.2)
Total Protein: 7 g/dL (ref 6.1–8.1)
eGFR: 101 mL/min/{1.73_m2} (ref >=60)

## 2023-10-02 ENCOUNTER — Telehealth: Payer: Self-pay | Admitting: Family Medicine

## 2023-10-02 NOTE — Telephone Encounter (Signed)
 Prescription Request  10/02/2023  LOV: 07/28/2023  What is the name of the medication or equipment?   OMEGA-3 ETHYL ESTERS 1 GM CAP  Have you contacted your pharmacy to request a refill? Yes   Which pharmacy would you like this sent to?  [id number] [place], [place] - [address] [place] [address] Phone: [phone] Fax: [phone]    Patient notified that their request is being sent to the clinical staff for review and that they should receive a response within 2 business days.   Please advise pharmacist.

## 2023-10-03 ENCOUNTER — Other Ambulatory Visit: Payer: Self-pay | Admitting: Family Medicine

## 2023-10-03 MED ORDER — OMEGA-3-ACID ETHYL ESTERS 1 G PO CAPS: 1.0000 g | ORAL_CAPSULE | Freq: Two times a day (BID) | ORAL | 11 refills | Status: AC

## 2023-10-06 ENCOUNTER — Ambulatory Visit: Admitting: Family Medicine

## 2023-10-06 ENCOUNTER — Encounter: Payer: Self-pay | Admitting: Family Medicine

## 2023-10-06 ENCOUNTER — Telehealth: Payer: Self-pay | Admitting: Family Medicine

## 2023-10-06 VITALS — BP 120/78 | HR 66 | Temp 97.8°F | Ht 70.0 in | Wt 205.4 lb

## 2023-10-06 DIAGNOSIS — G8929 Other chronic pain: Secondary | ICD-10-CM | POA: Diagnosis not present

## 2023-10-06 DIAGNOSIS — M25561 Pain in right knee: Secondary | ICD-10-CM

## 2023-10-06 DIAGNOSIS — M2042 Other hammer toe(s) (acquired), left foot: Secondary | ICD-10-CM | POA: Diagnosis not present

## 2023-10-06 NOTE — Progress Notes (Signed)
 Subjective:    Patient ID: Cody Curry, male    DOB: 11-Sep-1970, 53 y.o.   MRN: [id number] 11/04/22 I recently saw the patient for pain in his right knee.  This recently intensified to the point he had to go to the emergency room.  X-ray showed mild degenerative change of the knee.  He also had CT scan of the right knee.  There was no obvious fracture.  Patient however had a moderate effusion and significant pain over the medial compartment.  Patient is barely able to walk today due to pain in the medial compartment although the effusion has improved.  At that time, my plan was; I suspect a meniscal tear.  Patient has tried and failed conservative treatment with an NSAID.  Therefore he agrees to a cortisone injection.  Using sterile technique, I injected the right knee with 2 cc of lidocaine, 2 cc of Marcaine, and 2 cc of 40 mg/mL Kenalog.  The patient tolerated the procedure well without complication.  I will consult orthopedics.  If not improving, he will likely require an MRI to evaluate further  10/05/33 Patient has been dealing with pain in his right knee ever since.  He has received cortisone injections but they typically only last 1 to 2 weeks and then the pain returns.  He reports pain with walking.  He reports pain with standing.  He reports pain going up and down steps.  He would like to see an orthopedist to discuss his next option.  On his left foot, he also has a hammertoe in the second digit.  This causes significant pain at the tip of his toe.  He would like to see if diagnosed to discuss having surgery corrected.  Lastly, the patient reports pain in his right foot on the plantar surface of the calcaneus near the insertion of the plantar fascia.  This was hurting last week.  However it is gradually improved.  Today on exam there is no visible deformity on the plantar surface of the foot.  There is no rashes or lesions.  There is no tenderness to palpation. Past Medical History:  Diagnosis  Date   Diabetes mellitus type 2 in obese    Diabetic ketoacidosis without coma associated with type 2 diabetes mellitus (HCC)    DKA, type 2 (HCC) 03/28/2017   Hypertriglyceridemia    Neuromuscular disorder [facility])    Past Surgical History:  Procedure Laterality Date   COLONOSCOPY  01/25/2021   [name] at [facility]   REPLACEMENT TOTAL KNEE     Current Outpatient Medications on File Prior to Visit  Medication Sig Dispense Refill   atorvastatin (LIPITOR) 40 MG tablet Take 1 tablet (40 mg total) by mouth daily. 90 tablet 3   buPROPion (WELLBUTRIN XL) 150 MG 24 hr tablet TAKE 1 TABLET BY MOUTH EVERY DAY 90 tablet 0   celecoxib (CELEBREX) 200 MG capsule TAKE 1 CAPSULE BY MOUTH TWICE A DAY 60 capsule 0   gabapentin (NEURONTIN) 300 MG capsule Take 1 capsule (300 mg total) by mouth 3 (three) times daily. 270 capsule 3   metFORMIN (GLUCOPHAGE) 500 MG tablet TAKE 1 TABLET BY MOUTH 2 TIMES DAILY WITH A MEAL. 180 tablet 3   omega-3 acid ethyl esters (LOVAZA) 1 g capsule Take 1 capsule (1 g total) by mouth 2 (two) times daily. 60 capsule 11   sertraline (ZOLOFT) 100 MG tablet TAKE 1 TABLET BY MOUTH EVERY DAY 90 tablet 3   tadalafil (CIALIS) 20 MG  tablet Take 0.5-1 tablets (10-20 mg total) by mouth every other day as needed for erectile dysfunction. 10 tablet 11   tirzepatide (MOUNJARO) 12.5 MG/0.5ML Pen Inject 12.5 mg into the skin once a week. 6 mL 3   No current facility-administered medications on file prior to visit.   No Known Allergies Social History   Socioeconomic History   Marital status: Single    Spouse name: Not on file   Number of children: Not on file   Years of education: Not on file   Highest education level: Some college, no degree  Occupational History   Not on file  Tobacco Use   Smoking status: Former    Current packs/day: 1.50    Types: Cigarettes   Smokeless tobacco: Never  Vaping Use   Vaping status: Never Used  Substance and Sexual Activity   Alcohol use: No     Alcohol/week: 0.0 standard drinks of alcohol    Comment: Very occasional   Drug use: Not Currently    Types: Marijuana    Comment: quit one year ago   Sexual activity: Not on file  Other Topics Concern   Not on file  Social History Narrative   Not on file   Social Drivers of Health   Financial Resource Strain: High Risk (07/28/2023)   Overall Financial Resource Strain (CARDIA)    Difficulty of Paying Living Expenses: Hard  Food Insecurity: Food Insecurity Present (07/28/2023)   Hunger Vital Sign    Worried About Running Out of Food in the Last Year: Never true    Ran Out of Food in the Last Year: Sometimes true  Transportation Needs: No Transportation Needs (07/28/2023)   PRAPARE - Administrator, Civil Service (Medical): No    Lack of Transportation (Non-Medical): No  Physical Activity: Sufficiently Active (07/28/2023)   Exercise Vital Sign    Days of Exercise per Week: 4 days    Minutes of Exercise per Session: 70 min  Stress: No Stress Concern Present (06/16/2023)   [employer] of Occupational Health - Occupational Stress Questionnaire    Feeling of Stress : Only a little  Social Connections: Moderately Integrated (07/28/2023)   Social Connection and Isolation Panel [NHANES]    Frequency of Communication with Friends and Family: Once a week    Frequency of Social Gatherings with Friends and Family: More than three times a week    Attends Religious Services: More than 4 times per year    Active Member of [employer] or Organizations: Yes    Attends Engineer, structural: More than 4 times per year    Marital Status: Divorced  Catering manager Violence: Not on file      Review of Systems  All other systems reviewed and are negative.      Objective:   Physical Exam Vitals reviewed.  Constitutional:      Appearance: He is obese.  Cardiovascular:     Rate and Rhythm: Normal rate and regular rhythm.     Pulses: Normal pulses.     Heart sounds:  Normal heart sounds.  Pulmonary:     Effort: Pulmonary effort is normal. No respiratory distress.     Breath sounds: Normal breath sounds. No wheezing, rhonchi or rales.  Musculoskeletal:     Right knee: No effusion or erythema. Decreased range of motion. Tenderness present over the medial joint line. Abnormal meniscus.     Right foot: Normal range of motion. No deformity, tenderness, bony tenderness or crepitus.  Left foot: Deformity present.       Feet:  Lymphadenopathy:     Cervical: No cervical adenopathy.  Neurological:     Mental Status: He is alert.           Assessment & Plan:  Chronic pain of right knee - Plan: Ambulatory referral to Orthopedic Surgery  Hammer toe of left foot - Plan: Ambulatory referral to Podiatry I will consult podiatry regarding the hammertoe in his left second toe.  I will consult orthopedic surgery due to the chronic pain in his right knee from degenerative joint disease.  He has tried and failed conservative therapy and cortisone injections.  I believe the pain in his foot was due to plantar fasciitis however this is gradually improved.  I recommended good supportive footwear as well as stretches to improve the flexibility in the plantar fascia to help prevent recurrences

## 2023-10-06 NOTE — Telephone Encounter (Signed)
 Patient dropped off form for provider completion back in February which was incomplete; form from [employer] to donate blood.  Patient specified form missing cause and location of neuropathy. Copy of original incomplete form attached to new form.  Patient requesting to be notified via MyChart when forms completed and ready for pickup.  Forms placed on nurse's desk. No charge.

## 2023-10-23 ENCOUNTER — Other Ambulatory Visit: Payer: Self-pay | Admitting: Family Medicine

## 2023-10-23 DIAGNOSIS — M24662 Ankylosis, left knee: Secondary | ICD-10-CM | POA: Insufficient documentation

## 2023-10-24 NOTE — Telephone Encounter (Signed)
 Requested Prescriptions  Pending Prescriptions Disp Refills   buPROPion (WELLBUTRIN XL) 150 MG 24 hr tablet [Pharmacy Med Name: BUPROPION HCL XL 150 MG TABLET] 90 tablet 0    Sig: TAKE 1 TABLET BY MOUTH EVERY DAY     Psychiatry: Antidepressants - bupropion Failed - 10/24/2023 10:07 AM      Failed - Valid encounter within last 6 months    Recent Outpatient Visits           2 weeks ago Chronic pain of right knee   [place] [facility] Austine Lefort, MD   2 months ago Rib pain   [place] [facility] Austine Lefort, MD   4 months ago Moderate episode of recurrent major depressive disorder [facility])   [place] [facility] Austine Lefort, MD   7 months ago Cervical strain, acute, initial encounter   [place] [facility] Jenelle Mis, FNP   8 months ago Type 2 diabetes mellitus with diabetic neuropathy, unspecified whether long term insulin use [facility])   [place] [facility] [name], Cisco Crest, MD              Passed - Cr in normal range and within 360 days    Creat  Date Value Ref Range Status  09/29/2023 0.91 0.70 - 1.30 mg/dL Final   Creatinine, Urine  Date Value Ref Range Status  08/12/2022 157 20 - 320 mg/dL Final         Passed - AST in normal range and within 360 days    AST  Date Value Ref Range Status  09/29/2023 16 10 - 35 U/L Final         Passed - ALT in normal range and within 360 days    ALT  Date Value Ref Range Status  09/29/2023 10 9 - 46 U/L Final         Passed - Last BP in normal range    BP Readings from Last 1 Encounters:  10/06/23 120/78

## 2023-10-26 ENCOUNTER — Other Ambulatory Visit: Payer: Self-pay

## 2023-10-26 ENCOUNTER — Other Ambulatory Visit: Payer: Self-pay | Admitting: Family Medicine

## 2023-10-26 DIAGNOSIS — E1169 Type 2 diabetes mellitus with other specified complication: Secondary | ICD-10-CM

## 2023-10-26 DIAGNOSIS — I10 Essential (primary) hypertension: Secondary | ICD-10-CM

## 2023-10-26 DIAGNOSIS — R739 Hyperglycemia, unspecified: Secondary | ICD-10-CM

## 2023-10-26 NOTE — Telephone Encounter (Signed)
 Requested Prescriptions  Pending Prescriptions Disp Refills   celecoxib (CELEBREX) 200 MG capsule [Pharmacy Med Name: CELECOXIB 200 MG CAPSULE] 180 capsule 1    Sig: TAKE 1 CAPSULE BY MOUTH TWICE A DAY     Analgesics:  COX2 Inhibitors Failed - 10/26/2023  5:41 PM      Failed - Manual Review: Labs are only required if the patient has taken medication for more than 8 weeks.      Passed - HGB in normal range and within 360 days    Hemoglobin  Date Value Ref Range Status  02/24/2023 16.0 13.2 - 17.1 g/dL Final         Passed - Cr in normal range and within 360 days    Creat  Date Value Ref Range Status  09/29/2023 0.91 0.70 - 1.30 mg/dL Final   Creatinine, Urine  Date Value Ref Range Status  08/12/2022 157 20 - 320 mg/dL Final         Passed - HCT in normal range and within 360 days    HCT  Date Value Ref Range Status  02/24/2023 47.4 38.5 - 50.0 % Final         Passed - AST in normal range and within 360 days    AST  Date Value Ref Range Status  09/29/2023 16 10 - 35 U/L Final         Passed - ALT in normal range and within 360 days    ALT  Date Value Ref Range Status  09/29/2023 10 9 - 46 U/L Final         Passed - eGFR is 30 or above and within 360 days    GFR, Est African American  Date Value Ref Range Status  11/11/2020 104 > OR = 60 mL/min/1.53m2 Final   GFR, Est Non African American  Date Value Ref Range Status  11/11/2020 90 > OR = 60 mL/min/1.54m2 Final   eGFR  Date Value Ref Range Status  09/29/2023 101 > OR = 60 mL/min/1.59m2 Final         Passed - Patient is not pregnant      Passed - Valid encounter within last 12 months    Recent Outpatient Visits           2 weeks ago Chronic pain of right knee   [place] [facility] [name], Cisco Crest, MD   3 months ago Rib pain   [place] [facility] Austine Lefort, MD   4 months ago Moderate episode of recurrent major depressive disorder [facility])   [place]  [facility] Austine Lefort, MD   7 months ago Cervical strain, acute, initial encounter   [place] [facility] Jenelle Mis, FNP   8 months ago Type 2 diabetes mellitus with diabetic neuropathy, unspecified whether long term insulin use [facility])   [place] [facility] [name], Cisco Crest, MD

## 2023-10-27 ENCOUNTER — Other Ambulatory Visit

## 2023-10-27 DIAGNOSIS — E1169 Type 2 diabetes mellitus with other specified complication: Secondary | ICD-10-CM

## 2023-10-27 DIAGNOSIS — I10 Essential (primary) hypertension: Secondary | ICD-10-CM

## 2023-10-28 LAB — BASIC METABOLIC PANEL WITH GFR
BUN: 16 mg/dL (ref 7–25)
CO2: 29 mmol/L (ref 20–32)
Calcium: 9.5 mg/dL (ref 8.6–10.3)
Chloride: 103 mmol/L (ref 98–110)
Creat: 1.04 mg/dL (ref 0.70–1.30)
Glucose, Bld: 75 mg/dL (ref 65–99)
Potassium: 4.7 mmol/L (ref 3.5–5.3)
Sodium: 141 mmol/L (ref 135–146)
eGFR: 86 mL/min/{1.73_m2} (ref >=60)

## 2023-10-28 LAB — CBC WITH DIFFERENTIAL/PLATELET
Absolute Lymphocytes: 2211 {cells}/uL (ref 850–3900)
Absolute Monocytes: 911 {cells}/uL (ref 200–950)
Basophils Absolute: 54 {cells}/uL (ref 0–200)
Basophils Relative: 0.4 %
Eosinophils Absolute: 255 {cells}/uL (ref 15–500)
Eosinophils Relative: 1.9 %
HCT: 48.5 % (ref 38.5–50.0)
Hemoglobin: 16.4 g/dL (ref 13.2–17.1)
MCH: 30.5 pg (ref 27.0–33.0)
MCHC: 33.8 g/dL (ref 32.0–36.0)
MCV: 90.1 fL (ref 80.0–100.0)
MPV: 9.8 fL (ref 7.5–12.5)
Monocytes Relative: 6.8 %
Neutro Abs: 9970 {cells}/uL — ABNORMAL HIGH (ref 1500–7800)
Neutrophils Relative %: 74.4 %
Platelets: 240 10*3/uL (ref 140–400)
RBC: 5.38 10*6/uL (ref 4.20–5.80)
RDW: 12.4 % (ref 11.0–15.0)
Total Lymphocyte: 16.5 %
WBC: 13.4 10*3/uL — ABNORMAL HIGH (ref 3.8–10.8)

## 2023-10-28 LAB — HEMOGLOBIN A1C
Hgb A1c MFr Bld: 5.7 % — ABNORMAL HIGH (ref <5.7)
Mean Plasma Glucose: 117 mg/dL
eAG (mmol/L): 6.5 mmol/L

## 2023-11-13 ENCOUNTER — Other Ambulatory Visit: Payer: Self-pay | Admitting: Family Medicine

## 2023-11-22 NOTE — Patient Instructions (Signed)
 SURGICAL WAITING ROOM VISITATION  Patients having surgery or a procedure may have no more than 2 support people in the waiting area - these visitors may rotate.    Children under the age of 65 must have an adult with them who is not the patient. .  Visitors with respiratory illnesses are discouraged from visiting and should remain at home.  If the patient needs to stay at the hospital during part of their recovery, the visitor guidelines for inpatient rooms apply. Pre-op nurse will coordinate an appropriate time for 1 support person to accompany patient in pre-op.  This support person may not rotate.    Please refer to the [facility] website for the visitor guidelines for Inpatients (after your surgery is over and you are in a regular room).       Your procedure is scheduled on:  12/07/23    Report to [facility] Main Entrance    Report to admitting at  0830 AM   Call this number if you have problems the morning of surgery [phone]   Do not eat food :After Midnight.   After Midnight you may have the following liquids until ___ 0800___ AM DAY OF SURGERY  Water Non-Citrus Juices (without pulp, NO RED-Apple, White grape, White cranberry) Black Coffee (NO MILK/CREAM OR CREAMERS, sugar ok)  Clear Tea (NO MILK/CREAM OR CREAMERS, sugar ok) regular and decaf                             Plain Jell-O (NO RED)                                           Fruit ices (not with fruit pulp, NO RED)                                     Popsicles (NO RED)                                                               Sports drinks like Gatorade (NO RED)                     The day of surgery:  Drink ONE (1) Pre-Surgery Clear Ensure or G2 at 0800 AM  ( have completed by ) the morning of surgery. Drink in one sitting. Do not sip.  This drink was given to you during your hospital  pre-op appointment visit. Nothing else to drink after completing the  Pre-Surgery Clear Ensure or G2.           If you have questions, please contact your surgeon's office.       Oral Hygiene is also important to reduce your risk of infection.                                    Remember - BRUSH YOUR TEETH THE MORNING OF SURGERY WITH YOUR REGULAR TOOTHPASTE  DENTURES WILL BE REMOVED PRIOR TO SURGERY  PLEASE DO NOT APPLY "Poly grip" OR ADHESIVES!!!   Do NOT smoke after Midnight   Stop all vitamins and herbal supplements 7 days before surgery.   Take these medicines the morning of surgery with A SIP OF WATER:  wellbutrin , gabapentin , zoloft               Metformin - none am of surgery             Mounjaro - last dose on   DO NOT TAKE ANY ORAL DIABETIC MEDICATIONS DAY OF YOUR SURGERY  Bring CPAP mask and tubing day of surgery.                              You may not have any metal on your body including hair pins, jewelry, and body piercing             Do not wear make-up, lotions, powders, perfumes/cologne, or deodorant  Do not wear nail polish including gel and S&S, artificial/acrylic nails, or any other type of covering on natural nails including finger and toenails. If you have artificial nails, gel coating, etc. that needs to be removed by a nail salon please have this removed prior to surgery or surgery may need to be canceled/ delayed if the surgeon/ anesthesia feels like they are unable to be safely monitored.   Do not shave  48 hours prior to surgery.               Men may shave face and neck.   Do not bring valuables to the hospital. [facility] IS NOT             RESPONSIBLE   FOR VALUABLES.   Contacts, glasses, dentures or bridgework may not be worn into surgery.   Bring small overnight bag day of surgery.   DO NOT BRING YOUR HOME MEDICATIONS TO THE HOSPITAL. PHARMACY WILL DISPENSE MEDICATIONS LISTED ON YOUR MEDICATION LIST TO YOU DURING YOUR ADMISSION IN THE HOSPITAL!    Patients discharged on the day of surgery will not be allowed to drive home.  Someone NEEDS to stay  with you for the first 24 hours after anesthesia.   Special Instructions: Bring a copy of your healthcare power of attorney and living will documents the day of surgery if you haven't scanned them before.              Please read over the following fact sheets you were given: IF YOU HAVE QUESTIONS ABOUT YOUR PRE-OP INSTRUCTIONS PLEASE CALL [phone]   If you received a COVID test during your pre-op visit  it is requested that you wear a mask when out in public, stay away from anyone that may not be feeling well and notify your surgeon if you develop symptoms. If you test positive for Covid or have been in contact with anyone that has tested positive in the last 10 days please notify you surgeon.      Pre-operative 5 CHG Bath Instructions   You can play a key role in reducing the risk of infection after surgery. Your skin needs to be as free of germs as possible. You can reduce the number of germs on your skin by washing with CHG (chlorhexidine gluconate) soap before surgery. CHG is an antiseptic soap that kills germs and continues to kill germs even after washing.   DO NOT use if you have an allergy to chlorhexidine/CHG or antibacterial soaps. If your skin  becomes reddened or irritated, stop using the CHG and notify one of our RNs at [phone].   Please shower with the CHG soap starting 4 days before surgery using the following schedule:     Please keep in mind the following:  DO NOT shave, including legs and underarms, starting the day of your first shower.   You may shave your face at any point before/day of surgery.  Place clean sheets on your bed the day you start using CHG soap. Use a clean washcloth (not used since being washed) for each shower. DO NOT sleep with pets once you start using the CHG.   CHG Shower Instructions:  If you choose to wash your hair and private area, wash first with your normal shampoo/soap.  After you use shampoo/soap, rinse your hair and body  thoroughly to remove shampoo/soap residue.  Turn the water OFF and apply about 3 tablespoons (45 ml) of CHG soap to a CLEAN washcloth.  Apply CHG soap ONLY FROM YOUR NECK DOWN TO YOUR TOES (washing for 3-5 minutes)  DO NOT use CHG soap on face, private areas, open wounds, or sores.  Pay special attention to the area where your surgery is being performed.  If you are having back surgery, having someone wash your back for you may be helpful. Wait 2 minutes after CHG soap is applied, then you may rinse off the CHG soap.  Pat dry with a clean towel  Put on clean clothes/pajamas   If you choose to wear lotion, please use ONLY the CHG-compatible lotions on the back of this paper.     Additional instructions for the day of surgery: DO NOT APPLY any lotions, deodorants, cologne, or perfumes.   Put on clean/comfortable clothes.  Brush your teeth.  Ask your nurse before applying any prescription medications to the skin.      CHG Compatible Lotions   Aveeno Moisturizing lotion  Cetaphil Moisturizing Cream  Cetaphil Moisturizing Lotion  Clairol Herbal Essence Moisturizing Lotion, Dry Skin  Clairol Herbal Essence Moisturizing Lotion, Extra Dry Skin  Clairol Herbal Essence Moisturizing Lotion, Normal Skin  Curel Age Defying Therapeutic Moisturizing Lotion with Alpha Hydroxy  Curel Extreme Care Body Lotion  Curel Soothing Hands Moisturizing Hand Lotion  Curel Therapeutic Moisturizing Cream, Fragrance-Free  Curel Therapeutic Moisturizing Lotion, Fragrance-Free  Curel Therapeutic Moisturizing Lotion, Original Formula  Eucerin Daily Replenishing Lotion  Eucerin Dry Skin Therapy Plus Alpha Hydroxy Crme  Eucerin Dry Skin Therapy Plus Alpha Hydroxy Lotion  Eucerin Original Crme  Eucerin Original Lotion  Eucerin Plus Crme Eucerin Plus Lotion  Eucerin TriLipid Replenishing Lotion  Keri Anti-Bacterial Hand Lotion  Keri Deep Conditioning Original Lotion Dry Skin Formula Softly Scented  Keri  Deep Conditioning Original Lotion, Fragrance Free Sensitive Skin Formula  Keri Lotion Fast Absorbing Fragrance Free Sensitive Skin Formula  Keri Lotion Fast Absorbing Softly Scented Dry Skin Formula  Keri Original Lotion  Keri Skin Renewal Lotion Keri Silky Smooth Lotion  Keri Silky Smooth Sensitive Skin Lotion  Nivea Body Creamy Conditioning Oil  Nivea Body Extra Enriched Teacher, adult education Moisturizing Lotion Nivea Crme  Nivea Skin Firming Lotion  NutraDerm 30 Skin Lotion  NutraDerm Skin Lotion  NutraDerm Therapeutic Skin Cream  NutraDerm Therapeutic Skin Lotion  ProShield Protective Hand Cream  Provon moisturizing lotion

## 2023-11-22 NOTE — Progress Notes (Signed)
 Anesthesia Review:  PCP: Vallorie Gayer pickard LVo 10/06/23  Cardiologist :  PPM/ ICD: Device Orders: Rep Notified:  Chest x-ray : EKG : Echo : Stress test: Cardiac Cath :   Activity level:  Sleep Study/ CPAP : Fasting Blood Sugar :      / Checks Blood Sugar -- times a day:     DM- type  Hgba1c-  10/27/23-5.7  Metformin - none am of surgery  Mounjaro - Last dose on   Blood Thinner/ Instructions /Last Dose: ASA / Instructions/ Last Dose :

## 2023-11-24 ENCOUNTER — Encounter: Payer: Self-pay | Admitting: Family Medicine

## 2023-11-24 ENCOUNTER — Ambulatory Visit: Admitting: Family Medicine

## 2023-11-24 VITALS — BP 126/72 | HR 87 | Temp 97.8°F | Ht 70.0 in | Wt 201.0 lb

## 2023-11-24 DIAGNOSIS — M1711 Unilateral primary osteoarthritis, right knee: Secondary | ICD-10-CM | POA: Insufficient documentation

## 2023-11-24 DIAGNOSIS — R2242 Localized swelling, mass and lump, left lower limb: Secondary | ICD-10-CM

## 2023-11-24 DIAGNOSIS — E1140 Type 2 diabetes mellitus with diabetic neuropathy, unspecified: Secondary | ICD-10-CM | POA: Diagnosis not present

## 2023-11-24 DIAGNOSIS — G5603 Carpal tunnel syndrome, bilateral upper limbs: Secondary | ICD-10-CM | POA: Diagnosis not present

## 2023-11-24 DIAGNOSIS — Z7984 Long term (current) use of oral hypoglycemic drugs: Secondary | ICD-10-CM | POA: Diagnosis not present

## 2023-11-24 NOTE — Progress Notes (Signed)
 Subjective:    Patient ID: Cody Curry, male    DOB: 06/08/1971, 53 y.o.   MRN: [id number] Patient has 2 concerns today.  First he has developed a soft tissue like mass on the lateral aspect of the MP joint.  It is soft and spongy is roughly 3 cm by half centimeters.  It feels like a lipoma.  It is not painful however it does alter his gait.  He would like to see a podiatrist to have this removed.  Second he has a history of severe carpal tunnel syndrome.  They recommended surgery in the past.  Recently he has developed numbness in both hands distal to his wrist.  He is unable to determine if his fifth digit is involved.  Today he has a positive Tinel sign, positive Phalen sign.  He has good grip strength and normal muscle strength in his hands.  He also has pain in his right shoulder with abduction greater than 90 degrees.  This is only been present for 2 days.  He has positive empty can sign and a positive Hawking sign.  This occurred after working underneath his car repairing something while laying on his back Past Medical History:  Diagnosis Date  . Diabetes mellitus type 2 in obese   . Diabetic ketoacidosis without coma associated with type 2 diabetes mellitus (HCC)   . DKA, type 2 (HCC) 03/28/2017  . Hypertriglyceridemia   . Neuromuscular disorder [facility])    Past Surgical History:  Procedure Laterality Date  . COLONOSCOPY  01/25/2021   [name] at [facility]  . REPLACEMENT TOTAL KNEE     Current Outpatient Medications on File Prior to Visit  Medication Sig Dispense Refill  . atorvastatin  (LIPITOR) 40 MG tablet Take 1 tablet (40 mg total) by mouth daily. 90 tablet 3  . buPROPion  (WELLBUTRIN  XL) 150 MG 24 hr tablet TAKE 1 TABLET BY MOUTH EVERY DAY 90 tablet 2  . celecoxib  (CELEBREX ) 200 MG capsule TAKE 1 CAPSULE BY MOUTH TWICE A DAY 180 capsule 1  . gabapentin  (NEURONTIN ) 300 MG capsule TAKE 1 CAPSULE BY MOUTH THREE TIMES A DAY (Patient taking differently: Take 300 mg by mouth 2 (two)  times daily.) 270 capsule 3  . metFORMIN  (GLUCOPHAGE ) 500 MG tablet TAKE 1 TABLET BY MOUTH 2 TIMES DAILY WITH A MEAL. 180 tablet 3  . omega-3 acid ethyl esters (LOVAZA ) 1 g capsule Take 1 capsule (1 g total) by mouth 2 (two) times daily. 60 capsule 11  . sertraline  (ZOLOFT ) 100 MG tablet TAKE 1 TABLET BY MOUTH EVERY DAY 90 tablet 3  . tadalafil  (CIALIS ) 20 MG tablet Take 0.5-1 tablets (10-20 mg total) by mouth every other day as needed for erectile dysfunction. 10 tablet 11  . tirzepatide  (MOUNJARO ) 12.5 MG/0.5ML Pen Inject 12.5 mg into the skin once a week. 6 mL 3   No current facility-administered medications on file prior to visit.   No Known Allergies Social History   Socioeconomic History  . Marital status: Single    Spouse name: Not on file  . Number of children: Not on file  . Years of education: Not on file  . Highest education level: Some college, no degree  Occupational History  . Not on file  Tobacco Use  . Smoking status: Former    Current packs/day: 1.50    Types: Cigarettes  . Smokeless tobacco: Never  Vaping Use  . Vaping status: Never Used  Substance and Sexual Activity  . Alcohol use:  No    Alcohol/week: 0.0 standard drinks of alcohol    Comment: Very occasional  . Drug use: Not Currently    Types: Marijuana    Comment: quit one year ago  . Sexual activity: Not on file  Other Topics Concern  . Not on file  Social History Narrative  . Not on file   Social Drivers of Health   Financial Resource Strain: High Risk (07/28/2023)   Overall Financial Resource Strain (CARDIA)   . Difficulty of Paying Living Expenses: Hard  Food Insecurity: Food Insecurity Present (07/28/2023)   Hunger Vital Sign   . Worried About Programme researcher, broadcasting/film/video in the Last Year: Never true   . Ran Out of Food in the Last Year: Sometimes true  Transportation Needs: No Transportation Needs (07/28/2023)   PRAPARE - Transportation   . Lack of Transportation (Medical): No   . Lack of  Transportation (Non-Medical): No  Physical Activity: Sufficiently Active (07/28/2023)   Exercise Vital Sign   . Days of Exercise per Week: 4 days   . Minutes of Exercise per Session: 70 min  Stress: No Stress Concern Present (06/16/2023)   [employer] of Occupational Health - Occupational Stress Questionnaire   . Feeling of Stress : Only a little  Social Connections: Moderately Integrated (07/28/2023)   Social Connection and Isolation Panel [NHANES]   . Frequency of Communication with Friends and Family: Once a week   . Frequency of Social Gatherings with Friends and Family: More than three times a week   . Attends Religious Services: More than 4 times per year   . Active Member of Clubs or Organizations: Yes   . Attends Banker Meetings: More than 4 times per year   . Marital Status: Divorced  Catering manager Violence: Not on file      Review of Systems  All other systems reviewed and are negative.      Objective:   Physical Exam Vitals reviewed.  Constitutional:      Appearance: He is obese.  Cardiovascular:     Rate and Rhythm: Normal rate and regular rhythm.     Pulses: Normal pulses.          Dorsalis pedis pulses are 2+ on the left side.       Posterior tibial pulses are 2+ on the left side.     Heart sounds: Normal heart sounds.  Pulmonary:     Effort: Pulmonary effort is normal. No respiratory distress.     Breath sounds: Normal breath sounds. No wheezing, rhonchi or rales.  Musculoskeletal:     Right shoulder: Tenderness present. No bony tenderness. Decreased range of motion.     Right hand: No bony tenderness. Normal range of motion. Normal strength. Normal sensation.     Left hand: No bony tenderness. Normal range of motion. Normal strength. Normal sensation.     Right knee: No effusion or erythema. Decreased range of motion. Tenderness present over the medial joint line. Abnormal meniscus.     Right foot: No tenderness, bony tenderness or  crepitus.     Left foot: Deformity present.       Feet:  Feet:     Left foot:     Skin integrity: Skin integrity normal. No erythema, warmth or callus.  Lymphadenopathy:     Cervical: No cervical adenopathy.  Neurological:     Mental Status: He is alert.          Assessment & Plan:  Foot  mass, left - Plan: Ambulatory referral to Podiatry  Type 2 diabetes mellitus with diabetic neuropathy, unspecified whether long term insulin  use (HCC) - Plan: Urine Albumin/Creatinine with ratio (send out) [LAB689]  Bilateral carpal tunnel syndrome - Plan: Ambulatory referral to Hand Surgery I believe the patient may have a lipoma versus some type of cyst near the fifth MTP joint.  Recommended podiatry consult to discuss excision given the fact that it is affecting his gait.  He also has bilateral carpal tunnel syndrome.  Recommended hand surgery consultation to discuss surgical correction.  I believe he has denied his right rotator cuff.  We discussed there is an injection but he deferred that at the present time.

## 2023-11-28 ENCOUNTER — Encounter (HOSPITAL_COMMUNITY)
Admission: RE | Admit: 2023-11-28 | Discharge: 2023-11-28 | Disposition: A | Discharge status: Home / Self Care | Source: Ambulatory Visit | Attending: Family Medicine | Admitting: Family Medicine

## 2023-12-07 ENCOUNTER — Ambulatory Visit (HOSPITAL_COMMUNITY): Admission: RE | Admit: 2023-12-07 | Source: Home / Self Care | Admitting: Orthopedic Surgery

## 2023-12-07 ENCOUNTER — Encounter (HOSPITAL_COMMUNITY): Admission: RE | Payer: Self-pay | Source: Home / Self Care

## 2023-12-07 SURGERY — ARTHROPLASTY, KNEE, TOTAL
Anesthesia: Spinal | Site: Knee | Laterality: Right

## 2023-12-08 ENCOUNTER — Encounter: Payer: Self-pay | Admitting: Family Medicine

## 2023-12-13 ENCOUNTER — Other Ambulatory Visit: Payer: Self-pay

## 2023-12-13 ENCOUNTER — Encounter (HOSPITAL_BASED_OUTPATIENT_CLINIC_OR_DEPARTMENT_OTHER): Payer: Self-pay | Admitting: Orthopedic Surgery

## 2023-12-18 ENCOUNTER — Encounter (HOSPITAL_BASED_OUTPATIENT_CLINIC_OR_DEPARTMENT_OTHER)
Admission: RE | Admit: 2023-12-18 | Discharge: 2023-12-18 | Disposition: A | Discharge status: Home / Self Care | Source: Ambulatory Visit | Attending: Orthopedic Surgery

## 2023-12-18 DIAGNOSIS — Z01818 Encounter for other preprocedural examination: Secondary | ICD-10-CM | POA: Diagnosis not present

## 2023-12-18 DIAGNOSIS — E669 Obesity, unspecified: Secondary | ICD-10-CM | POA: Insufficient documentation

## 2023-12-18 DIAGNOSIS — E1169 Type 2 diabetes mellitus with other specified complication: Secondary | ICD-10-CM | POA: Insufficient documentation

## 2023-12-18 LAB — BASIC METABOLIC PANEL WITH GFR
Anion gap: 5 (ref 5–15)
BUN: 9 mg/dL (ref 6–20)
CO2: 28 mmol/L (ref 22–32)
Calcium: 9 mg/dL (ref 8.9–10.3)
Chloride: 105 mmol/L (ref 98–111)
Creatinine, Ser: 0.73 mg/dL (ref 0.61–1.24)
GFR, Estimated: >60 mL/min (ref >60)
Glucose, Bld: 103 mg/dL — ABNORMAL HIGH (ref 70–99)
Potassium: 5.2 mmol/L — ABNORMAL HIGH (ref 3.5–5.1)
Sodium: 138 mmol/L (ref 135–145)

## 2023-12-18 NOTE — Progress Notes (Signed)

## 2023-12-20 ENCOUNTER — Ambulatory Visit (HOSPITAL_BASED_OUTPATIENT_CLINIC_OR_DEPARTMENT_OTHER): Admitting: Anesthesiology

## 2023-12-20 ENCOUNTER — Ambulatory Visit (HOSPITAL_BASED_OUTPATIENT_CLINIC_OR_DEPARTMENT_OTHER)
Admission: RE | Admit: 2023-12-20 | Discharge: 2023-12-20 | Disposition: A | Discharge status: Home / Self Care | Attending: Orthopedic Surgery | Admitting: Orthopedic Surgery

## 2023-12-20 ENCOUNTER — Encounter (HOSPITAL_BASED_OUTPATIENT_CLINIC_OR_DEPARTMENT_OTHER): Payer: Self-pay | Admitting: Orthopedic Surgery

## 2023-12-20 ENCOUNTER — Encounter (HOSPITAL_BASED_OUTPATIENT_CLINIC_OR_DEPARTMENT_OTHER)
Admission: RE | Disposition: A | Discharge status: Home / Self Care | Payer: Self-pay | Source: Home / Self Care | Attending: Orthopedic Surgery

## 2023-12-20 DIAGNOSIS — Z7985 Long-term (current) use of injectable non-insulin antidiabetic drugs: Secondary | ICD-10-CM | POA: Diagnosis not present

## 2023-12-20 DIAGNOSIS — E1141 Type 2 diabetes mellitus with diabetic mononeuropathy: Secondary | ICD-10-CM | POA: Diagnosis not present

## 2023-12-20 DIAGNOSIS — F418 Other specified anxiety disorders: Secondary | ICD-10-CM | POA: Diagnosis not present

## 2023-12-20 DIAGNOSIS — E119 Type 2 diabetes mellitus without complications: Secondary | ICD-10-CM | POA: Diagnosis not present

## 2023-12-20 DIAGNOSIS — G5602 Carpal tunnel syndrome, left upper limb: Secondary | ICD-10-CM

## 2023-12-20 DIAGNOSIS — I10 Essential (primary) hypertension: Secondary | ICD-10-CM | POA: Insufficient documentation

## 2023-12-20 DIAGNOSIS — Z87891 Personal history of nicotine dependence: Secondary | ICD-10-CM | POA: Insufficient documentation

## 2023-12-20 DIAGNOSIS — Z7984 Long term (current) use of oral hypoglycemic drugs: Secondary | ICD-10-CM | POA: Insufficient documentation

## 2023-12-20 DIAGNOSIS — E1169 Type 2 diabetes mellitus with other specified complication: Secondary | ICD-10-CM

## 2023-12-20 HISTORY — PX: CARPAL TUNNEL RELEASE: SHX101

## 2023-12-20 HISTORY — DX: Anxiety disorder, unspecified: F41.9

## 2023-12-20 HISTORY — DX: Depression, unspecified: F32.A

## 2023-12-20 LAB — GLUCOSE, CAPILLARY
Glucose-Capillary: 96 mg/dL (ref 70–99)
Glucose-Capillary: 102 mg/dL — ABNORMAL HIGH (ref 70–99)

## 2023-12-20 SURGERY — CARPAL TUNNEL RELEASE
Anesthesia: Monitor Anesthesia Care | Site: Hand | Laterality: Left

## 2023-12-20 MED ORDER — ONDANSETRON HCL 4 MG/2ML IJ SOLN
INTRAMUSCULAR | Status: DC | PRN
  Administered 2023-12-20: 4 mg via INTRAVENOUS

## 2023-12-20 MED ORDER — OXYCODONE HCL 5 MG/5ML PO SOLN: 5.0000 mg | Freq: Once | ORAL | Status: DC | PRN

## 2023-12-20 MED ORDER — DEXMEDETOMIDINE HCL IN NACL 80 MCG/20ML IV SOLN
INTRAVENOUS | Status: DC | PRN
Start: 2023-12-20 — End: 2023-12-20
  Administered 2023-12-20: 8 ug via INTRAVENOUS
  Administered 2023-12-20: 12 ug via INTRAVENOUS

## 2023-12-20 MED ORDER — MIDAZOLAM HCL 5 MG/5ML IJ SOLN
INTRAMUSCULAR | Status: DC | PRN
  Administered 2023-12-20: 2 mg via INTRAVENOUS

## 2023-12-20 MED ORDER — LACTATED RINGERS IV SOLN: INTRAVENOUS | Status: DC | PRN

## 2023-12-20 MED ORDER — SODIUM CHLORIDE 0.9 % IV SOLN: 12.5000 mg | INTRAVENOUS | Status: DC | PRN

## 2023-12-20 MED ORDER — CEFAZOLIN SODIUM-DEXTROSE 2-4 GM/100ML-% IV SOLN
2.0000 g | INTRAVENOUS | Status: AC
  Administered 2023-12-20: 2 g via INTRAVENOUS

## 2023-12-20 MED ORDER — HYDROMORPHONE HCL 1 MG/ML IJ SOLN: 0.2500 mg | INTRAMUSCULAR | Status: DC | PRN

## 2023-12-20 MED ORDER — FENTANYL CITRATE (PF) 100 MCG/2ML IJ SOLN
INTRAMUSCULAR | Status: AC
Start: 2023-12-20 — End: 2023-12-20
  Filled 2023-12-20: qty 2

## 2023-12-20 MED ORDER — MIDAZOLAM HCL 2 MG/2ML IJ SOLN
INTRAMUSCULAR | Status: AC
Start: 2023-12-20 — End: 2023-12-20
  Filled 2023-12-20: qty 2

## 2023-12-20 MED ORDER — PROPOFOL 500 MG/50ML IV EMUL
INTRAVENOUS | Status: DC | PRN
  Administered 2023-12-20: 200 ug/kg/min via INTRAVENOUS

## 2023-12-20 MED ORDER — LIDOCAINE HCL 1 % IJ SOLN
INTRAMUSCULAR | Status: DC | PRN
  Administered 2023-12-20: 10 mL via SURGICAL_CAVITY

## 2023-12-20 MED ORDER — OXYCODONE HCL 5 MG PO TABS: 5.0000 mg | ORAL_TABLET | Freq: Once | ORAL | Status: DC | PRN

## 2023-12-20 MED ORDER — FENTANYL CITRATE (PF) 100 MCG/2ML IJ SOLN
INTRAMUSCULAR | Status: DC | PRN
  Administered 2023-12-20: 50 ug via INTRAVENOUS

## 2023-12-20 MED ORDER — OXYCODONE HCL 5 MG PO TABS: 5.0000 mg | ORAL_TABLET | Freq: Four times a day (QID) | ORAL | 0 refills | Status: AC | PRN

## 2023-12-20 MED ORDER — LACTATED RINGERS IV SOLN: INTRAVENOUS | Status: DC

## 2023-12-20 SURGICAL SUPPLY — 24 items
BLADE SURG 15 STRL LF DISP TIS (BLADE) ×2 IMPLANT
BNDG ELASTIC 3INX 5YD STR LF (GAUZE/BANDAGES/DRESSINGS) ×2 IMPLANT
BNDG ESMARK 4X9 LF (GAUZE/BANDAGES/DRESSINGS) ×2 IMPLANT
BNDG GAUZE DERMACEA FLUFF 4 (GAUZE/BANDAGES/DRESSINGS) ×2 IMPLANT
CHLORAPREP W/TINT 26 (MISCELLANEOUS) ×2 IMPLANT
CORD BIPOLAR FORCEPS 12FT (ELECTRODE) ×2 IMPLANT
COVER BACK TABLE 60X90IN (DRAPES) ×2 IMPLANT
CUFF TOURN SGL QUICK 18X4 (TOURNIQUET CUFF) ×2 IMPLANT
DRAPE EXTREMITY T 121X128X90 (DISPOSABLE) ×2 IMPLANT
DRAPE SURG 17X23 STRL (DRAPES) ×2 IMPLANT
GAUZE XEROFORM 1X8 LF (GAUZE/BANDAGES/DRESSINGS) ×2 IMPLANT
GLOVE BIO SURGEON STRL SZ7 (GLOVE) ×2 IMPLANT
GLOVE BIOGEL PI IND STRL 7.0 (GLOVE) ×2 IMPLANT
GOWN STRL REUS W/ TWL LRG LVL3 (GOWN DISPOSABLE) ×4 IMPLANT
NDL HYPO 25X1 1.5 SAFETY (NEEDLE) ×2 IMPLANT
NEEDLE HYPO 25X1 1.5 SAFETY (NEEDLE) ×1 IMPLANT
NS IRRIG 1000ML POUR BTL (IV SOLUTION) ×2 IMPLANT
PACK BASIN DAY SURGERY FS (CUSTOM PROCEDURE TRAY) ×2 IMPLANT
SHEET MEDIUM DRAPE 40X70 STRL (DRAPES) ×2 IMPLANT
SUT ETHILON 4 0 PS 2 18 (SUTURE) ×2 IMPLANT
SYR BULB EAR ULCER 3OZ GRN STR (SYRINGE) ×2 IMPLANT
SYR CONTROL 10ML LL (SYRINGE) ×2 IMPLANT
TOWEL GREEN STERILE FF (TOWEL DISPOSABLE) ×4 IMPLANT
UNDERPAD 30X36 HEAVY ABSORB (UNDERPADS AND DIAPERS) ×2 IMPLANT

## 2023-12-20 NOTE — Op Note (Signed)
   Date of Surgery: 12/20/2023  INDICATIONS: Patient is a 53 y.o.-year-old male with left carpal tunnel syndrome that has failed conservative management.  Risks, benefits, and alternatives to surgery were again discussed with the patient in the preoperative area. The patient wishes to proceed with surgery.  Informed consent was signed after our discussion.   PREOPERATIVE DIAGNOSIS:  Left carpal tunnel syndrome  POSTOPERATIVE DIAGNOSIS: Same.  PROCEDURE:  Left carpal tunnel release   SURGEON: Auburn Blaze, M.D.  ASSIST:   ANESTHESIA:  Local, MAC  IV FLUIDS AND URINE: See anesthesia.  ESTIMATED BLOOD LOSS: 5 mL.  IMPLANTS: * No implants in log *   DRAINS: None  COMPLICATIONS: None  DESCRIPTION OF PROCEDURE: The patient was met in the preoperative holding area where the surgical site was marked and the informed consent form was signed.  The patient was then brought back to the operating room and remained on the stretcher.  A hand table was placed adjacent to the operative extremity and locked into place.  A tourniquet was placed on the left forearm.  A formal timeout was performed to confirm that this was the correct patient, surgical side, surgical site, and surgical procedure.  All were present and in agreement. Following formal timeout, a local block was performed using 10 mL of 0.25% plain marcaine  and 1% plain lidocaine in an equal mixture.  The left upper extremity was then prepped and draped in the usual and sterile fashion.   Following a second timeout, the limb was exsanguinated and the tourniquet inflated to 250 mmHg.  A longitudinal incision was made in line with the radial border of the ring finger from distal to the wrist flexion crease to the intersection of Kaplan's cardinal line.  The skin and subcutaneous tissue was sharply divided.  The longitudinally running palmar fascia was incised.  The thenar musculature was bluntly swept off of the transverse carpal ligament.   The ligament was divided from proximal to distal until the fat surrounding the palmar arch was encountered. Retractors were then placed in the proximal aspect of the wound to visualize the distal antebrachial fascia.  The fascia was sharply divided under direct visualization.   The wound was then thoroughly irrigated with sterile saline.  The tourniquet was deflated.  Hemostasis was achieved with direct pressure and bipolar electrocautery.  The wound was then closed with 4-0 nylon sutures in a horizontal mattress fashion. The wound was then dressed with xeroform, folded kerlix, and an ace wrap.  The patient was reversed from sedation and the drapes taken down.   All counts were correct x 2 at the end of the procedure.  The patient was then taken to the PACU in stable condition.    POSTOPERATIVE PLAN: He will be discharged to home with appropriate pain medication and discharge instructions.  I'll see him back in 10-14 days for his first postop visit.   Auburn Blaze, MD 10:17 AM

## 2023-12-20 NOTE — Anesthesia Preprocedure Evaluation (Signed)
 Anesthesia Evaluation  Patient identified by MRN, date of birth, ID band Patient awake    Reviewed: Allergy & Precautions, H&P , NPO status , Patient's Chart, lab work & pertinent test results  Airway Mallampati: II  TM Distance: >3 FB Neck ROM: Full    Dental no notable dental hx.    Pulmonary neg pulmonary ROS, former smoker   Pulmonary exam normal breath sounds clear to auscultation       Cardiovascular hypertension, Normal cardiovascular exam Rhythm:Regular Rate:Normal     Neuro/Psych   Anxiety Depression    negative neurological ROS  negative psych ROS   GI/Hepatic negative GI ROS, Neg liver ROS,,,  Endo/Other  negative endocrine ROSdiabetes, Type 2    Renal/GU negative Renal ROS  negative genitourinary   Musculoskeletal negative musculoskeletal ROS (+)    Abdominal   Peds negative pediatric ROS (+)  Hematology negative hematology ROS (+)   Anesthesia Other Findings   Reproductive/Obstetrics negative OB ROS                             Anesthesia Physical Anesthesia Plan  ASA: 3  Anesthesia Plan: MAC   Post-op Pain Management: Minimal or no pain anticipated   Induction: Intravenous  PONV Risk Score and Plan: 1 and Ondansetron  and Treatment may vary due to age or medical condition  Airway Management Planned: Simple Face Mask  Additional Equipment:   Intra-op Plan:   Post-operative Plan:   Informed Consent: I have reviewed the patients History and Physical, chart, labs and discussed the procedure including the risks, benefits and alternatives for the proposed anesthesia with the patient or authorized representative who has indicated his/her understanding and acceptance.     Dental advisory given  Plan Discussed with: CRNA  Anesthesia Plan Comments:        Anesthesia Quick Evaluation

## 2023-12-20 NOTE — Discharge Instructions (Addendum)
Audria Nine, M.D. Hand Surgery  POST-OPERATIVE DISCHARGE INSTRUCTIONS   PRESCRIPTIONS: - You may have been given a prescription to be taken as directed for post-operative pain control.  You may also take over the counter ibuprofen/aleve and tylenol for pain. Take this as directed on the packaging. Do not exceed 3000 mg tylenol/acetaminophen in 24 hours.  Ibuprofen 600-800 mg (3-4) tablets by mouth every 6 hours as needed for pain.   OR  Aleve 2 tablets by mouth every 12 hours (twice daily) as needed for pain.   AND/OR  Tylenol 1000 mg (2 tablets) every 8 hours as needed for pain.  - Please use your pain medication carefully, as refills are limited and you may not be provided with one.  As stated above, please use over the counter pain medicine - it will also be helpful with decreasing your swelling.    ANESTHESIA: -After your surgery, post-surgical discomfort or pain is likely. This discomfort can last several days to a few weeks. At certain times of the day your discomfort may be more intense.   Did you receive a nerve block?   - A nerve block can provide pain relief for one hour to two days after your surgery. As long as the nerve block is working, you will experience little or no sensation in the area the surgeon operated on.  - As the nerve block wears off, you will begin to experience pain or discomfort. It is very important that you begin taking your prescribed pain medication before the nerve block fully wears off. Treating your pain at the first sign of the block wearing off will ensure your pain is better controlled and more tolerable when full-sensation returns. Do not wait until the pain is intolerable, as the medicine will be less effective. It is better to treat pain in advance than to try and catch up.   General Anesthesia:  If you did not receive a nerve block during your surgery, you will need to start taking your pain medication shortly after your surgery and  should continue to do so as prescribed by your surgeon.     ICE AND ELEVATION: - You may use ice for the first 48-72 hours, but it is not critical.   - Motion of your fingers is very important to decrease the swelling.  - Elevation, as much as possible for the next 48 hours, is critical for decreasing swelling as well as for pain relief. Elevation means when you are seated or lying down, you hand should be at or above your heart. When walking, the hand needs to be at or above the level of your elbow.  - If the bandage gets too tight, it may need to be loosened. Please contact our office and we will instruct you in how to do this.    SURGICAL BANDAGES:  - Keep your dressing and/or splint clean and dry at all times.  You can remove your dressing 7 days from now and change with a dry dressing or Band-Aids as needed thereafter. - You may place a plastic bag over your bandage to shower, but be careful, do not get your bandages wet.  - After the bandages have been removed, it is OK to get the stitches wet in a shower or with hand washing. Do Not soak or submerge the wound yet. Please do not use lotions or creams on the stitches.      HAND THERAPY:  - You may not need any. If you  do, we will begin this at your follow up visit in the clinic.    ACTIVITY AND WORK: - You are encouraged to move any fingers which are not in the bandage.  - Light use of the fingers is allowed to assist the other hand with daily hygiene and eating, but strong gripping or lifting is often uncomfortable and should be avoided.  - You might miss a variable period of time from work and hopefully this issue has been discussed prior to surgery. You may not do any heavy work with your affected hand for about 2 weeks.    [facility] Second Floor, [address] [place], [place] [address] [phone]    Post Anesthesia Home Care Instructions  Activity: Get plenty of rest for the remainder of the day. A  responsible individual must stay with you for 24 hours following the procedure.  For the next 24 hours, DO NOT: -Drive a car -Paediatric nurse -Drink alcoholic beverages -Take any medication unless instructed by your physician -Make any legal decisions or sign important papers.  Meals: Start with liquid foods such as gelatin or soup. Progress to regular foods as tolerated. Avoid greasy, spicy, heavy foods. If nausea and/or vomiting occur, drink only clear liquids until the nausea and/or vomiting subsides. Call your physician if vomiting continues.  Special Instructions/Symptoms: Your throat may feel dry or sore from the anesthesia or the breathing tube placed in your throat during surgery. If this causes discomfort, gargle with warm salt water. The discomfort should disappear within 24 hours.  If you had a scopolamine patch placed behind your ear for the management of post- operative nausea and/or vomiting:  1. The medication in the patch is effective for 72 hours, after which it should be removed.  Wrap patch in a tissue and discard in the trash. Wash hands thoroughly with soap and water. 2. You may remove the patch earlier than 72 hours if you experience unpleasant side effects which may include dry mouth, dizziness or visual disturbances. 3. Avoid touching the patch. Wash your hands with soap and water after contact with the patch.

## 2023-12-20 NOTE — H&P (Signed)
 HAND SURGERY   HPI: Patient is a 53 y.o. male who presents with left carpal tunnel syndrome that has failed conservative management.  Patient denies any changes to their medical history or new systemic symptoms today.    Past Medical History:  Diagnosis Date   Anxiety    Depression    Diabetes mellitus type 2 in obese    Diabetic ketoacidosis without coma associated with type 2 diabetes mellitus (HCC)    DKA, type 2 (HCC) 03/28/2017   Hypertriglyceridemia    Neuromuscular disorder (HCC)    Past Surgical History:  Procedure Laterality Date   COLONOSCOPY  01/25/2021   Harry Lindau at [facility]   JOINT REPLACEMENT     left wrist surgery     as child   REPLACEMENT TOTAL KNEE     Social History   Socioeconomic History   Marital status: Single    Spouse name: Not on file   Number of children: Not on file   Years of education: Not on file   Highest education level: Some college, no degree  Occupational History   Not on file  Tobacco Use   Smoking status: Former    Current packs/day: 1.50    Types: Cigarettes   Smokeless tobacco: Never  Vaping Use   Vaping status: Never Used  Substance and Sexual Activity   Alcohol use: No    Alcohol/week: 0.0 standard drinks of alcohol    Comment: Very occasional   Drug use: Not Currently    Types: Marijuana    Comment: last smoked 5-31 25   Sexual activity: Not on file  Other Topics Concern   Not on file  Social History Narrative   Not on file   Social Drivers of Health   Financial Resource Strain: High Risk (07/28/2023)   Overall Financial Resource Strain (CARDIA)    Difficulty of Paying Living Expenses: Hard  Food Insecurity: Food Insecurity Present (07/28/2023)   Hunger Vital Sign    Worried About Running Out of Food in the Last Year: Never true    Ran Out of Food in the Last Year: Sometimes true  Transportation Needs: No Transportation Needs (07/28/2023)   PRAPARE - Administrator, Civil Service (Medical): No     Lack of Transportation (Non-Medical): No  Physical Activity: Sufficiently Active (07/28/2023)   Exercise Vital Sign    Days of Exercise per Week: 4 days    Minutes of Exercise per Session: 70 min  Stress: No Stress Concern Present (06/16/2023)   [employer] of Occupational Health - Occupational Stress Questionnaire    Feeling of Stress : Only a little  Social Connections: Moderately Integrated (07/28/2023)   Social Connection and Isolation Panel [NHANES]    Frequency of Communication with Friends and Family: Once a week    Frequency of Social Gatherings with Friends and Family: More than three times a week    Attends Religious Services: More than 4 times per year    Active Member of [employer] or Organizations: Yes    Attends Engineer, structural: More than 4 times per year    Marital Status: Divorced   Family History  Problem Relation Age of Onset   COPD Mother    Stroke Mother    Hyperlipidemia Father    Hypertension Father    Cancer Father        bladder   Heart disease Father    COPD Father    Colon cancer Neg Hx  Colon polyps Neg Hx    Esophageal cancer Neg Hx    Stomach cancer Neg Hx    Rectal cancer Neg Hx    - negative except otherwise stated in the family history section No Known Allergies Prior to Admission medications   Medication Sig Start Date End Date Taking? Authorizing Provider  atorvastatin  (LIPITOR) 40 MG tablet Take 1 tablet (40 mg total) by mouth daily. 02/24/23  Yes Austine Lefort, MD  buPROPion  (WELLBUTRIN  XL) 150 MG 24 hr tablet TAKE 1 TABLET BY MOUTH EVERY DAY 10/24/23  Yes Austine Lefort, MD  celecoxib  (CELEBREX ) 200 MG capsule TAKE 1 CAPSULE BY MOUTH TWICE A DAY 10/26/23  Yes Austine Lefort, MD  gabapentin  (NEURONTIN ) 300 MG capsule TAKE 1 CAPSULE BY MOUTH THREE TIMES A DAY Patient taking differently: Take 300 mg by mouth 2 (two) times daily. 11/14/23  Yes Austine Lefort, MD  metFORMIN  (GLUCOPHAGE ) 500 MG tablet TAKE 1 TABLET BY  MOUTH 2 TIMES DAILY WITH A MEAL. 08/24/23  Yes Austine Lefort, MD  omega-3 acid ethyl esters (LOVAZA ) 1 g capsule Take 1 capsule (1 g total) by mouth 2 (two) times daily. 10/03/23  Yes Austine Lefort, MD  sertraline  (ZOLOFT ) 100 MG tablet TAKE 1 TABLET BY MOUTH EVERY DAY 09/11/23  Yes Austine Lefort, MD  tadalafil  (CIALIS ) 20 MG tablet Take 0.5-1 tablets (10-20 mg total) by mouth every other day as needed for erectile dysfunction. 07/28/23   Austine Lefort, MD  tirzepatide  (MOUNJARO ) 12.5 MG/0.5ML Pen Inject 12.5 mg into the skin once a week. 02/24/23   Austine Lefort, MD   No results found. - Positive ROS: All other systems have been reviewed and were otherwise negative with the exception of those mentioned in the HPI and as above.  Physical Exam: General: No acute distress, resting comfortably Cardiovascular: BUE warm and well perfused, normal rate Respiratory: Normal WOB on RA Skin: Warm and dry Neurologic: Sensation intact distally Psychiatric: Patient is at baseline mood and affect  Left Upper Extremity  Left Upper Extremity:  Positive Tinel sign Positive Phalen's test Positive Durkan's test  Negative Tinel at elbow  No thenar atrophy No intrinsic atrophy Normal two-point discrimination  Full and painless AROM of wrist and fingers. Fingers warm and well perfused with brisk capillary refill   Assessment: 53 yo F w/ left carpal tunnel syndrome that has failed conservative management.   Plan: OR today for left carpal tunnel release. We again reviewed the risks of surgery which include bleeding, infection, damage to neurovascular structures, persistent symptoms, scar sensitivity or pillar pain, need for additional surgery.  Informed consent was signed.  All questions were answered.   Marilyn Shropshire, M.D. [facility] 9:21 AM

## 2023-12-20 NOTE — Anesthesia Postprocedure Evaluation (Signed)
 Anesthesia Post Note  Patient: Secundino A Croucher  Procedure(s) Performed: CARPAL TUNNEL RELEASE (Left: Hand)     Patient location during evaluation: PACU Anesthesia Type: MAC Level of consciousness: awake and alert Pain management: pain level controlled Vital Signs Assessment: post-procedure vital signs reviewed and stable Respiratory status: spontaneous breathing, nonlabored ventilation and respiratory function stable Cardiovascular status: blood pressure returned to baseline and stable Postop Assessment: no apparent nausea or vomiting Anesthetic complications: no   No notable events documented.  Last Vitals:  Vitals:   12/20/23 1045 12/20/23 1100  BP: 109/82 95/70  Pulse: 67 60  Resp: 18 11  Temp:  (!) 36.2 C  SpO2: 93%     Last Pain:  Vitals:   12/20/23 1100  TempSrc: Temporal  PainSc: 0-No pain                 Earvin Goldberg

## 2023-12-20 NOTE — Interval H&P Note (Signed)
 History and Physical Interval Note:  12/20/2023 9:22 AM  Cody Curry  has presented today for surgery, with the diagnosis of Left carpal tunnel syndrome.  The various methods of treatment have been discussed with the patient and family. After consideration of risks, benefits and other options for treatment, the patient has consented to  Procedure(s) with comments: CARPAL TUNNEL RELEASE (Left) - MAC & LOCAL as a surgical intervention.  The patient's history has been reviewed, patient examined, no change in status, stable for surgery.  I have reviewed the patient's chart and labs.  Questions were answered to the patient's satisfaction.     Chanson Teems

## 2023-12-20 NOTE — Transfer of Care (Signed)
 Immediate Anesthesia Transfer of Care Note  Patient: Cody Curry  Procedure(s) Performed: CARPAL TUNNEL RELEASE (Left: Hand)  Patient Location: PACU  Anesthesia Type:MAC  Level of Consciousness: awake, alert , and oriented  Airway & Oxygen Therapy: Patient Spontanous Breathing and Patient connected to face mask oxygen  Post-op Assessment: Report given to RN and Post -op Vital signs reviewed and stable  Post vital signs: Reviewed and stable  Last Vitals:  Vitals Value Taken Time  BP    Temp    Pulse    Resp    SpO2      Last Pain:  Vitals:   12/20/23 0852  TempSrc: Temporal  PainSc: 4          Complications: No notable events documented.

## 2023-12-21 ENCOUNTER — Encounter (HOSPITAL_BASED_OUTPATIENT_CLINIC_OR_DEPARTMENT_OTHER): Payer: Self-pay | Admitting: Orthopedic Surgery

## 2024-03-06 ENCOUNTER — Ambulatory Visit: Payer: Self-pay

## 2024-03-06 NOTE — Telephone Encounter (Signed)
 FYI Only or Action Required?: FYI only for provider.  Patient was last seen in primary care on 11/24/2023 by Duanne Butler DASEN, MD.  Called Nurse Triage reporting Leg Pain.  Symptoms began several months ago.  Interventions attempted: Rest, hydration, or home remedies.  Symptoms are: unchanged.  Triage Disposition: See PCP When Office is Open (Within 3 Days)  Patient/caregiver understands and will follow disposition?: Yes Reason for Disposition  [1] MODERATE pain (e.g., interferes with normal activities, limping) AND [2] present > 3 days  Answer Assessment - Initial Assessment Questions Intermittent atraumatic right calf pain that is worse when sitting.  1. ONSET: When did the pain start?      One month 2. LOCATION: Where is the pain located?      Right shin and right ankle 3. PAIN: How bad is the pain?    (Scale 1-10; or mild, moderate, severe)     6-8/10 6. OTHER SYMPTOMS: Do you have any other symptoms? (e.g., chest pain, back pain, breathing difficulty, swelling, rash, fever, numbness, weakness)     States knee swells, but not shin or ankle  Protocols used: Leg Pain-A-AH Copied from CRM #[id number]. Topic: Clinical - Red Word Triage >> Mar 06, 2024  2:09 PM Charlet HERO wrote: Red Word that prompted transfer to Nurse Triage: The patient is stating that he is having worsening pain for his right leg and knee he is stating that he has arthritist.

## 2024-03-08 ENCOUNTER — Ambulatory Visit: Admitting: Family Medicine

## 2024-03-08 ENCOUNTER — Encounter: Payer: Self-pay | Admitting: Family Medicine

## 2024-03-08 VITALS — BP 128/82 | HR 72 | Temp 97.5°F | Ht 70.0 in | Wt 196.6 lb

## 2024-03-08 DIAGNOSIS — M79661 Pain in right lower leg: Secondary | ICD-10-CM

## 2024-03-08 DIAGNOSIS — M7712 Lateral epicondylitis, left elbow: Secondary | ICD-10-CM

## 2024-03-08 DIAGNOSIS — M25561 Pain in right knee: Secondary | ICD-10-CM | POA: Diagnosis not present

## 2024-03-08 MED ORDER — HYDROCODONE-ACETAMINOPHEN 5-325 MG PO TABS: 1.0000 | ORAL_TABLET | Freq: Four times a day (QID) | ORAL | 0 refills | Status: AC | PRN

## 2024-03-08 NOTE — Progress Notes (Signed)
 Subjective:    Patient ID: Cody Curry, male    DOB: Oct 30, 1970, 53 y.o.   MRN: [id number] 11/04/22 I recently saw the patient for pain in his right knee.  This recently intensified to the point he had to go to the emergency room.  X-ray showed mild degenerative change of the knee.  He also had CT scan of the right knee.  There was no obvious fracture.  Patient however had a moderate effusion and significant pain over the medial compartment.  Patient is barely able to walk today due to pain in the medial compartment although the effusion has improved.  At that time, my plan was; I suspect a meniscal tear.  Patient has tried and failed conservative treatment with an NSAID.  Therefore he agrees to a cortisone injection.  Using sterile technique, I injected the right knee with 2 cc of lidocaine , 2 cc of Marcaine , and 2 cc of 40 mg/mL Kenalog .  The patient tolerated the procedure well without complication.  I will consult orthopedics.  If not improving, he will likely require an MRI to evaluate further  10/05/33 Patient has been dealing with pain in his right knee ever since.  He has received cortisone injections but they typically only last 1 to 2 weeks and then the pain returns.  He reports pain with walking.  He reports pain with standing.  He reports pain going up and down steps.  He would like to see an orthopedist to discuss his next option.  On his left foot, he also has a hammertoe in the second digit.  This causes significant pain at the tip of his toe.  He would like to see if diagnosed to discuss having surgery corrected.  Lastly, the patient reports pain in his right foot on the plantar surface of the calcaneus near the insertion of the plantar fascia.  This was hurting last week.  However it is gradually improved.  Today on exam there is no visible deformity on the plantar surface of the foot.  There is no rashes or lesions.  There is no tenderness to palpation.  03/08/24 Patient reports aching  pain in his anterior shin.  This occurs when he sitting or when he is driving.  It is a deep aching pain that is roughly located along the anterior tibialis.  He describes it as a throbbing pain.  It seldom happens when he is walking.  He has excellent pulses in both legs, 2/4 dorsalis pedis and posterior tibialis pulses.  There is no pitting edema or asymmetric edema to suggest a blood clot.  He does have large varicose veins.  He is requesting pain medicine that he can take at night until he can have his right knee replacement.  He is having throbbing pain in his knee at night and keeps him awake.  His surgery has been scheduled but is pending.  He also complains of pain in his left lateral epicondyle.  He is tender to palpation in that area.  He has tenderness to palpation in the extensor tendon along with the extensor muscle compartment Past Medical History:  Diagnosis Date   Anxiety    Depression    Diabetes mellitus type 2 in obese    Diabetic ketoacidosis without coma associated with type 2 diabetes mellitus (HCC)    DKA, type 2 (HCC) 03/28/2017   Hypertriglyceridemia    Neuromuscular disorder (HCC)    Past Surgical History:  Procedure Laterality Date   CARPAL TUNNEL RELEASE  Left 12/20/2023   Procedure: CARPAL TUNNEL RELEASE;  Surgeon: Romona Harari, MD;  Location: [facility];  Service: Orthopedics;  Laterality: Left;  MAC & LOCAL   COLONOSCOPY  01/25/2021   Sandor Flatter at [facility]   JOINT REPLACEMENT     left wrist surgery     as child   REPLACEMENT TOTAL KNEE     Current Outpatient Medications on File Prior to Visit  Medication Sig Dispense Refill   atorvastatin  (LIPITOR) 40 MG tablet Take 1 tablet (40 mg total) by mouth daily. 90 tablet 3   buPROPion  (WELLBUTRIN  XL) 150 MG 24 hr tablet TAKE 1 TABLET BY MOUTH EVERY DAY 90 tablet 2   celecoxib  (CELEBREX ) 200 MG capsule TAKE 1 CAPSULE BY MOUTH TWICE A DAY 180 capsule 1   gabapentin  (NEURONTIN ) 300 MG capsule TAKE 1  CAPSULE BY MOUTH THREE TIMES A DAY (Patient taking differently: Take 300 mg by mouth 2 (two) times daily.) 270 capsule 3   metFORMIN  (GLUCOPHAGE ) 500 MG tablet TAKE 1 TABLET BY MOUTH 2 TIMES DAILY WITH A MEAL. 180 tablet 3   omega-3 acid ethyl esters (LOVAZA ) 1 g capsule Take 1 capsule (1 g total) by mouth 2 (two) times daily. 60 capsule 11   sertraline  (ZOLOFT ) 100 MG tablet TAKE 1 TABLET BY MOUTH EVERY DAY 90 tablet 3   tadalafil  (CIALIS ) 20 MG tablet Take 0.5-1 tablets (10-20 mg total) by mouth every other day as needed for erectile dysfunction. 10 tablet 11   tirzepatide  (MOUNJARO ) 12.5 MG/0.5ML Pen Inject 12.5 mg into the skin once a week. 6 mL 3   No current facility-administered medications on file prior to visit.   No Known Allergies Social History   Socioeconomic History   Marital status: Single    Spouse name: Not on file   Number of children: Not on file   Years of education: Not on file   Highest education level: Some college, no degree  Occupational History   Not on file  Tobacco Use   Smoking status: Former    Current packs/day: 1.50    Types: Cigarettes   Smokeless tobacco: Never  Vaping Use   Vaping status: Never Used  Substance and Sexual Activity   Alcohol use: No    Alcohol/week: 0.0 standard drinks of alcohol    Comment: Very occasional   Drug use: Not Currently    Types: Marijuana    Comment: last smoked 5-31 25   Sexual activity: Not on file  Other Topics Concern   Not on file  Social History Narrative   Not on file   Social Drivers of Health   Financial Resource Strain: High Risk (07/28/2023)   Overall Financial Resource Strain (CARDIA)    Difficulty of Paying Living Expenses: Hard  Food Insecurity: Food Insecurity Present (07/28/2023)   Hunger Vital Sign    Worried About Running Out of Food in the Last Year: Never true    Ran Out of Food in the Last Year: Sometimes true  Transportation Needs: No Transportation Needs (07/28/2023)   PRAPARE -  Administrator, Civil Service (Medical): No    Lack of Transportation (Non-Medical): No  Physical Activity: Sufficiently Active (07/28/2023)   Exercise Vital Sign    Days of Exercise per Week: 4 days    Minutes of Exercise per Session: 70 min  Stress: No Stress Concern Present (06/16/2023)   [employer] of Occupational Health - Occupational Stress Questionnaire    Feeling of Stress :  Only a little  Social Connections: Moderately Integrated (07/28/2023)   Social Connection and Isolation Panel    Frequency of Communication with Friends and Family: Once a week    Frequency of Social Gatherings with Friends and Family: More than three times a week    Attends Religious Services: More than 4 times per year    Active Member of [employer] or Organizations: Yes    Attends Engineer, structural: More than 4 times per year    Marital Status: Divorced  Catering manager Violence: Not on file      Review of Systems  All other systems reviewed and are negative.      Objective:   Physical Exam Vitals reviewed.  Constitutional:      Appearance: He is obese.  Cardiovascular:     Rate and Rhythm: Normal rate and regular rhythm.     Pulses: Normal pulses.     Heart sounds: Normal heart sounds.  Pulmonary:     Effort: Pulmonary effort is normal. No respiratory distress.     Breath sounds: Normal breath sounds. No wheezing, rhonchi or rales.  Musculoskeletal:     Left elbow: Normal range of motion. Tenderness present in lateral epicondyle.     Right knee: Decreased range of motion. Tenderness present over the medial joint line and lateral joint line.     Right foot: No tenderness, bony tenderness or crepitus.       Legs:  Lymphadenopathy:     Cervical: No cervical adenopathy.  Neurological:     Mental Status: He is alert.           Assessment & Plan:  Acute pain of right knee  Lateral epicondylitis of left elbow  Pain in right shin Patient needs arthroscopy  in his right knee.  The surgery has been scheduled.  I will give him a small amount of pain medication to take at night until he can have his corrective surgery.  I believe the pain in his right shin could be due to to overcompensation with walking causing muscle pain.  The other possibility would be nerve pain versus venous insufficiency pain.  I see no evidence of blood clot or cellulitis.  He is already on gabapentin .  Patient declines muscle relaxers.  I believe he has lateral epicondylitis.  Using sterile technique, I injected the area around the extensor tendon just distal to the lateral epicondyle at the point of maximal tenderness with a mixture of 1 cc of lidocaine  and 1 cc of 40 mg/mL Kenalog .

## 2024-03-12 MED ORDER — TRIAMCINOLONE ACETONIDE 40 MG/ML IJ SUSP
40.0000 mg | Freq: Once | INTRAMUSCULAR | Status: AC
  Administered 2024-03-08: 40 mg via INTRA_ARTICULAR

## 2024-03-12 NOTE — Addendum Note (Signed)
 Addended by: ANGELENA RONAL BRADLEY K on: 03/12/2024 08:17 AM   Modules accepted: Orders

## 2024-04-25 ENCOUNTER — Other Ambulatory Visit: Payer: Self-pay

## 2024-04-25 ENCOUNTER — Telehealth: Payer: Self-pay

## 2024-04-25 MED ORDER — TIRZEPATIDE 12.5 MG/0.5ML ~~LOC~~ SOAJ: 12.5000 mg | SUBCUTANEOUS | 3 refills | Status: AC

## 2024-04-25 NOTE — Telephone Encounter (Signed)
 Sent in medication

## 2024-04-25 NOTE — Telephone Encounter (Signed)
 Prescription Request  04/25/2024  LOV: 03/08/24  What is the name of the medication or equipment? tirzepatide  (MOUNJARO ) 12.5 MG/0.5ML Pen [[id number]]   Have you contacted your pharmacy to request a refill? Yes   Which pharmacy would you like this sent to?  [id number] [address], [place] - [address] [place] [address] Phone: [phone] Fax: [phone]    Patient notified that their request is being sent to the clinical staff for review and that they should receive a response within 2 business days.   Please advise at [facility] [phone]

## 2024-05-13 ENCOUNTER — Encounter: Payer: Self-pay | Admitting: Radiology

## 2024-07-03 ENCOUNTER — Other Ambulatory Visit: Payer: Self-pay

## 2024-07-03 NOTE — Telephone Encounter (Signed)
 Prescription Request  07/03/2024  LOV: 03/08/24  What is the name of the medication or equipment? celecoxib  (CELEBREX ) 200 MG capsule [[id number]]   Have you contacted your pharmacy to request a refill? Yes   Which pharmacy would you like this sent to?  [id number] [place], [place] - [address] [place] [address] Phone: [phone] Fax: [phone]    Patient notified that their request is being sent to the clinical staff for review and that they should receive a response within 2 business days.   Please advise at [facility] [phone]

## 2024-07-05 MED ORDER — CELECOXIB 200 MG PO CAPS: 200.0000 mg | ORAL_CAPSULE | Freq: Two times a day (BID) | ORAL | 0 refills | Status: AC

## 2024-07-05 NOTE — Telephone Encounter (Signed)
 Requested Prescriptions  Pending Prescriptions Disp Refills   celecoxib  (CELEBREX ) 200 MG capsule 180 capsule 0    Sig: Take 1 capsule (200 mg total) by mouth 2 (two) times daily.     Analgesics:  COX2 Inhibitors Failed - 07/05/2024 12:56 PM      Failed - Manual Review: Labs are only required if the patient has taken medication for more than 8 weeks.      Passed - HGB in normal range and within 360 days    Hemoglobin  Date Value Ref Range Status  10/27/2023 16.4 13.2 - 17.1 g/dL Final         Passed - Cr in normal range and within 360 days    Creat  Date Value Ref Range Status  10/27/2023 1.04 0.70 - 1.30 mg/dL Final   Creatinine, Ser  Date Value Ref Range Status  12/18/2023 0.73 0.61 - 1.24 mg/dL Final   Creatinine, Urine  Date Value Ref Range Status  08/12/2022 157 20 - 320 mg/dL Final         Passed - HCT in normal range and within 360 days    HCT  Date Value Ref Range Status  10/27/2023 48.5 38.5 - 50.0 % Final         Passed - AST in normal range and within 360 days    AST  Date Value Ref Range Status  09/29/2023 16 10 - 35 U/L Final         Passed - ALT in normal range and within 360 days    ALT  Date Value Ref Range Status  09/29/2023 10 9 - 46 U/L Final         Passed - eGFR is 30 or above and within 360 days    GFR, Est African American  Date Value Ref Range Status  11/11/2020 104 > OR = 60 mL/min/1.55m2 Final   GFR, Est Non African American  Date Value Ref Range Status  11/11/2020 90 > OR = 60 mL/min/1.27m2 Final   GFR, Estimated  Date Value Ref Range Status  12/18/2023 >60 >60 mL/min Final    Comment:    (NOTE) Calculated using the CKD-EPI Creatinine Equation (2021)    eGFR  Date Value Ref Range Status  10/27/2023 86 > OR = 60 mL/min/1.57m2 Final         Passed - Patient is not pregnant      Passed - Valid encounter within last 12 months    Recent Outpatient Visits           3 months ago Acute pain of right knee   [place]  [facility] Duanne Butler DASEN, MD   7 months ago Foot mass, left   [place] [facility] Duanne, Butler DASEN, MD   9 months ago Chronic pain of right knee   [place] [facility] Duanne Butler DASEN, MD   11 months ago Rib pain   [place] [facility] Duanne Butler DASEN, MD   1 year ago Moderate episode of recurrent major depressive disorder [facility])   [place] [facility] [name], Butler DASEN, MD

## 2024-08-16 ENCOUNTER — Ambulatory Visit: Admitting: Family Medicine

## 2024-08-23 ENCOUNTER — Ambulatory Visit: Admitting: Family Medicine
